# Patient Record
Sex: Female | Born: 1935 | Race: White | Hispanic: No | Smoking: Current every day smoker
Health system: Southern US, Community
[De-identification: ages and names within clinical notes are randomized; demographics above are authoritative.]

## PROBLEM LIST (undated history)

## (undated) DIAGNOSIS — I10 Essential (primary) hypertension: Secondary | ICD-10-CM

## (undated) DIAGNOSIS — J449 Chronic obstructive pulmonary disease, unspecified: Secondary | ICD-10-CM

## (undated) DIAGNOSIS — J45909 Unspecified asthma, uncomplicated: Secondary | ICD-10-CM

---

## 2015-08-28 ENCOUNTER — Emergency Department (HOSPITAL_COMMUNITY): Payer: No Typology Code available for payment source

## 2015-08-28 ENCOUNTER — Encounter (HOSPITAL_COMMUNITY): Payer: Self-pay | Admitting: Emergency Medicine

## 2015-08-28 ENCOUNTER — Emergency Department (HOSPITAL_COMMUNITY)
Admission: EM | Admit: 2015-08-28 | Discharge: 2015-08-29 | Disposition: A | Discharge status: Home / Self Care | Payer: No Typology Code available for payment source | Attending: Emergency Medicine | Admitting: Emergency Medicine

## 2015-08-28 DIAGNOSIS — S2222XA Fracture of body of sternum, initial encounter for closed fracture: Secondary | ICD-10-CM | POA: Diagnosis not present

## 2015-08-28 DIAGNOSIS — I10 Essential (primary) hypertension: Secondary | ICD-10-CM | POA: Insufficient documentation

## 2015-08-28 DIAGNOSIS — Z79899 Other long term (current) drug therapy: Secondary | ICD-10-CM | POA: Diagnosis not present

## 2015-08-28 DIAGNOSIS — Y998 Other external cause status: Secondary | ICD-10-CM | POA: Diagnosis not present

## 2015-08-28 DIAGNOSIS — Y9389 Activity, other specified: Secondary | ICD-10-CM | POA: Diagnosis not present

## 2015-08-28 DIAGNOSIS — Y92410 Unspecified street and highway as the place of occurrence of the external cause: Secondary | ICD-10-CM | POA: Insufficient documentation

## 2015-08-28 DIAGNOSIS — S29001A Unspecified injury of muscle and tendon of front wall of thorax, initial encounter: Secondary | ICD-10-CM | POA: Diagnosis present

## 2015-08-28 DIAGNOSIS — F17210 Nicotine dependence, cigarettes, uncomplicated: Secondary | ICD-10-CM | POA: Diagnosis not present

## 2015-08-28 DIAGNOSIS — J45909 Unspecified asthma, uncomplicated: Secondary | ICD-10-CM | POA: Diagnosis not present

## 2015-08-28 DIAGNOSIS — S2220XA Unspecified fracture of sternum, initial encounter for closed fracture: Secondary | ICD-10-CM

## 2015-08-28 HISTORY — DX: Unspecified asthma, uncomplicated: J45.909

## 2015-08-28 HISTORY — DX: Essential (primary) hypertension: I10

## 2015-08-28 LAB — I-STAT CHEM 8, ED
BUN: 15 mg/dL (ref 6–20)
CALCIUM ION: 1.17 mmol/L (ref 1.13–1.30)
Chloride: 100 mmol/L — ABNORMAL LOW (ref 101–111)
Creatinine, Ser: 1 mg/dL (ref 0.44–1.00)
Glucose, Bld: 132 mg/dL — ABNORMAL HIGH (ref 65–99)
HEMATOCRIT: 43 % (ref 36.0–46.0)
Hemoglobin: 14.6 g/dL (ref 12.0–15.0)
Potassium: 4.3 mmol/L (ref 3.5–5.1)
SODIUM: 140 mmol/L (ref 135–145)
TCO2: 27 mmol/L (ref 0–100)

## 2015-08-28 MED ORDER — IOHEXOL 300 MG/ML  SOLN
75.0000 mL | Freq: Once | INTRAMUSCULAR | Status: AC | PRN
  Administered 2015-08-28: 75 mL via INTRAVENOUS

## 2015-08-28 MED ORDER — OXYCODONE HCL 5 MG PO TABS
2.5000 mg | ORAL_TABLET | Freq: Once | ORAL | Status: AC
  Administered 2015-08-28: 2.5 mg via ORAL
  Filled 2015-08-28: qty 1

## 2015-08-28 MED ORDER — ACETAMINOPHEN 500 MG PO TABS
1000.0000 mg | ORAL_TABLET | Freq: Once | ORAL | Status: AC
  Administered 2015-08-28: 1000 mg via ORAL
  Filled 2015-08-28: qty 2
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
## 2015-08-28 NOTE — ED Notes (Signed)
Patient transported to X-ray 

## 2015-08-28 NOTE — ED Notes (Signed)
This RN attempted IV access x 2 without success; IV team consult in progress
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
## 2015-08-29 MED ORDER — HYDROCODONE-ACETAMINOPHEN 5-325 MG PO TABS: 0.5000 | ORAL_TABLET | ORAL | Status: DC | PRN

## 2015-08-29 MED ORDER — ONDANSETRON HCL 4 MG PO TABS: 4.0000 mg | ORAL_TABLET | Freq: Four times a day (QID) | ORAL | Status: DC
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
## 2017-04-06 ENCOUNTER — Ambulatory Visit (HOSPITAL_COMMUNITY)
Admission: RE | Admit: 2017-04-06 | Discharge: 2017-04-06 | Disposition: A | Discharge status: Home / Self Care | Payer: Medicare Other | Source: Ambulatory Visit | Attending: Family Medicine | Admitting: Family Medicine

## 2017-10-13 ENCOUNTER — Other Ambulatory Visit (HOSPITAL_COMMUNITY): Payer: Self-pay | Admitting: Respiratory Therapy

## 2017-10-13 DIAGNOSIS — J441 Chronic obstructive pulmonary disease with (acute) exacerbation: Secondary | ICD-10-CM

## 2017-10-13 DIAGNOSIS — F17200 Nicotine dependence, unspecified, uncomplicated: Secondary | ICD-10-CM

## 2018-10-28 ENCOUNTER — Encounter (HOSPITAL_COMMUNITY): Payer: Self-pay | Admitting: Emergency Medicine

## 2018-10-28 ENCOUNTER — Emergency Department (HOSPITAL_COMMUNITY)
Admission: EM | Admit: 2018-10-28 | Discharge: 2018-10-29 | Disposition: A | Discharge status: Home / Self Care | Payer: Medicare Other | Attending: Emergency Medicine | Admitting: Emergency Medicine

## 2018-10-28 ENCOUNTER — Emergency Department (HOSPITAL_COMMUNITY): Payer: Medicare Other

## 2018-10-28 DIAGNOSIS — J45909 Unspecified asthma, uncomplicated: Secondary | ICD-10-CM | POA: Diagnosis not present

## 2018-10-28 DIAGNOSIS — R109 Unspecified abdominal pain: Secondary | ICD-10-CM

## 2018-10-28 DIAGNOSIS — R1084 Generalized abdominal pain: Secondary | ICD-10-CM | POA: Diagnosis not present

## 2018-10-28 DIAGNOSIS — Z79899 Other long term (current) drug therapy: Secondary | ICD-10-CM | POA: Diagnosis not present

## 2018-10-28 DIAGNOSIS — K5901 Slow transit constipation: Secondary | ICD-10-CM

## 2018-10-28 DIAGNOSIS — F17210 Nicotine dependence, cigarettes, uncomplicated: Secondary | ICD-10-CM | POA: Diagnosis not present

## 2018-10-28 DIAGNOSIS — M545 Low back pain: Secondary | ICD-10-CM | POA: Diagnosis not present

## 2018-10-28 DIAGNOSIS — I10 Essential (primary) hypertension: Secondary | ICD-10-CM | POA: Insufficient documentation

## 2018-10-28 DIAGNOSIS — M549 Dorsalgia, unspecified: Secondary | ICD-10-CM

## 2018-10-28 LAB — COMPREHENSIVE METABOLIC PANEL
ALK PHOS: 74 U/L (ref 38–126)
ALT: 21 U/L (ref 0–44)
ANION GAP: 15 (ref 5–15)
AST: 31 U/L (ref 15–41)
Albumin: 4.6 g/dL (ref 3.5–5.0)
BILIRUBIN TOTAL: 0.8 mg/dL (ref 0.3–1.2)
BUN: 16 mg/dL (ref 8–23)
CALCIUM: 9.6 mg/dL (ref 8.9–10.3)
CO2: 29 mmol/L (ref 22–32)
CREATININE: 1.05 mg/dL — AB (ref 0.44–1.00)
Chloride: 86 mmol/L — ABNORMAL LOW (ref 98–111)
GFR, EST AFRICAN AMERICAN: 58 mL/min — AB (ref >60)
GFR, EST NON AFRICAN AMERICAN: 50 mL/min — AB (ref >60)
Glucose, Bld: 109 mg/dL — ABNORMAL HIGH (ref 70–99)
Potassium: 3.1 mmol/L — ABNORMAL LOW (ref 3.5–5.1)
Sodium: 130 mmol/L — ABNORMAL LOW (ref 135–145)
TOTAL PROTEIN: 8.7 g/dL — AB (ref 6.5–8.1)

## 2018-10-28 LAB — CBC WITH DIFFERENTIAL/PLATELET
Abs Immature Granulocytes: 0.05 10*3/uL (ref 0.00–0.07)
Basophils Absolute: 0 10*3/uL (ref 0.0–0.1)
Basophils Relative: 0 %
EOS ABS: 0 10*3/uL (ref 0.0–0.5)
EOS PCT: 0 %
HEMATOCRIT: 40.1 % (ref 36.0–46.0)
HEMOGLOBIN: 13 g/dL (ref 12.0–15.0)
Immature Granulocytes: 0 %
Lymphocytes Relative: 10 %
Lymphs Abs: 1.1 10*3/uL (ref 0.7–4.0)
MCH: 29 pg (ref 26.0–34.0)
MCHC: 32.4 g/dL (ref 30.0–36.0)
MCV: 89.3 fL (ref 80.0–100.0)
MONO ABS: 0.7 10*3/uL (ref 0.1–1.0)
Monocytes Relative: 6 %
NRBC: 0 % (ref 0.0–0.2)
Neutro Abs: 9.4 10*3/uL — ABNORMAL HIGH (ref 1.7–7.7)
Neutrophils Relative %: 84 %
Platelets: 292 10*3/uL (ref 150–400)
RBC: 4.49 MIL/uL (ref 3.87–5.11)
RDW: 12.5 % (ref 11.5–15.5)
WBC: 11.3 10*3/uL — AB (ref 4.0–10.5)

## 2018-10-28 LAB — URINALYSIS, ROUTINE W REFLEX MICROSCOPIC
BILIRUBIN URINE: NEGATIVE
Glucose, UA: NEGATIVE mg/dL
KETONES UR: NEGATIVE mg/dL
Nitrite: NEGATIVE
PH: 5 (ref 5.0–8.0)
PROTEIN: NEGATIVE mg/dL
Specific Gravity, Urine: 1.013 (ref 1.005–1.030)

## 2018-10-28 LAB — LIPASE, BLOOD: LIPASE: 35 U/L (ref 11–51)

## 2018-10-28 MED ORDER — POTASSIUM CHLORIDE CRYS ER 20 MEQ PO TBCR
40.0000 meq | EXTENDED_RELEASE_TABLET | Freq: Once | ORAL | Status: AC
  Administered 2018-10-29: 40 meq via ORAL
  Filled 2018-10-28: qty 2

## 2018-10-28 MED ORDER — HYDROMORPHONE HCL 1 MG/ML IJ SOLN
1.0000 mg | Freq: Once | INTRAMUSCULAR | Status: AC
  Administered 2018-10-28: 1 mg via INTRAVENOUS
  Filled 2018-10-28: qty 1

## 2018-10-28 MED ORDER — IOPAMIDOL (ISOVUE-300) INJECTION 61%
100.0000 mL | Freq: Once | INTRAVENOUS | Status: AC | PRN
  Administered 2018-10-28: 100 mL via INTRAVENOUS

## 2018-10-28 MED ORDER — IOPAMIDOL (ISOVUE-300) INJECTION 61%
INTRAVENOUS | Status: AC
  Filled 2018-10-28: qty 100

## 2018-10-28 MED ORDER — SODIUM CHLORIDE (PF) 0.9 % IJ SOLN
INTRAMUSCULAR | Status: AC
  Filled 2018-10-28: qty 50

## 2018-10-28 MED ORDER — LIDOCAINE 5 % EX PTCH
1.0000 | MEDICATED_PATCH | CUTANEOUS | Status: DC
  Administered 2018-10-28: 1 via TRANSDERMAL
  Filled 2018-10-28: qty 1

## 2018-10-28 MED ORDER — ONDANSETRON HCL 4 MG/2ML IJ SOLN
4.0000 mg | Freq: Once | INTRAMUSCULAR | Status: AC
  Administered 2018-10-28: 4 mg via INTRAVENOUS
  Filled 2018-10-28: qty 2

## 2018-10-28 MED ORDER — SODIUM CHLORIDE 0.9 % IV BOLUS
1000.0000 mL | Freq: Once | INTRAVENOUS | Status: AC
  Administered 2018-10-28: 1000 mL via INTRAVENOUS

## 2018-10-28 MED ORDER — CYCLOBENZAPRINE HCL 10 MG PO TABS
5.0000 mg | ORAL_TABLET | Freq: Once | ORAL | Status: DC
  Filled 2018-10-28: qty 1

## 2018-10-28 MED ORDER — DIAZEPAM 2 MG PO TABS
2.0000 mg | ORAL_TABLET | Freq: Once | ORAL | Status: AC
  Administered 2018-10-28: 2 mg via ORAL
  Filled 2018-10-28: qty 1

## 2018-10-28 NOTE — ED Notes (Addendum)
Pt's daughter called, sts pt has called her and told her that she was not able to lay still for abdominal CT. Daughter wants to know what is the reasoning behind focusing on pt's complaint of constipation when pt's biggest issue is back pain and back spasms. Daughter sts pt is not able to walk and she can't take her back home because pt lives alone. Dr Lockie Mola was made aware.

## 2018-10-28 NOTE — ED Notes (Signed)
Patient transported to CT 

## 2018-10-28 NOTE — ED Notes (Signed)
Bed: [facility] Expected date:  Expected time:  Means of arrival:  Comments: EMS-back spasms

## 2018-10-28 NOTE — ED Provider Notes (Signed)
[facility]-EMERGENCY DEPT Provider Note   CSN: [id number] Arrival date & time: 10/28/18  1655     History   Chief Complaint Chief Complaint  Patient presents with  . back spasm  . Constipation    HPI Julia Hernandez is a 83 y.o. female.  The history is provided by the patient.  Back Pain  Location:  Lumbar spine Quality:  Aching Radiates to:  Does not radiate Pain severity:  Moderate Pain is:  Same all the time Onset quality:  Gradual Timing:  Constant Progression:  Unchanged Chronicity:  New Context: not physical stress and not recent illness   Relieved by:  Nothing (started on muscle relaxant by PCP. ) Worsened by:  Movement Associated symptoms: abdominal pain (diffuse, constipation )   Associated symptoms: no bladder incontinence, no bowel incontinence, no chest pain, no dysuria, no fever, no leg pain, no numbness, no paresthesias, no pelvic pain, no perianal numbness, no tingling and no weakness     Past Medical History:  Diagnosis Date  . Asthma   . Hypertension     There are no active problems to display for this patient.   History reviewed. No pertinent surgical history.   OB History   No obstetric history on file.      Home Medications    Prior to Admission medications   Medication Sig Start Date End Date Taking? Authorizing Provider  acetaminophen (TYLENOL) 500 MG tablet Take 500 mg by mouth 2 (two) times daily.   Yes [provider]  acyclovir ointment (ZOVIRAX) 5 % Apply 1 application topically 3 (three) times daily as needed (outbreaks).   Yes [provider]  atorvastatin (LIPITOR) 20 MG tablet Take 20 mg by mouth daily.   Yes [provider]  diazepam (VALIUM) 5 MG tablet Take 5 mg by mouth daily.  08/16/15  Yes [provider]  hydrochlorothiazide (HYDRODIURIL) 25 MG tablet Take 25 mg by mouth daily.   Yes [provider]  Ipratropium-Albuterol (COMBIVENT RESPIMAT) 20-100  MCG/ACT AERS respimat Inhale 1 puff into the lungs 4 (four) times daily as needed for shortness of breath.   Yes [provider]  losartan (COZAAR) 100 MG tablet Take 100 mg by mouth daily.   Yes [provider]  sertraline (ZOLOFT) 100 MG tablet Take 100 mg by mouth at bedtime.   Yes [provider]  tiZANidine (ZANAFLEX) 2 MG tablet Take 2 mg by mouth 3 (three) times daily as needed for muscle spasms.   Yes [provider]  HYDROcodone-acetaminophen (NORCO/VICODIN) 5-325 MG tablet Take 0.5 tablets by mouth every 4 (four) hours as needed for moderate pain. Patient not taking: Reported on 10/28/2018 08/29/15   Gilda Crease, MD  ondansetron (ZOFRAN) 4 MG tablet Take 1 tablet (4 mg total) by mouth every 6 (six) hours. Patient not taking: Reported on 10/28/2018 08/29/15   Gilda Crease, MD    Family History No family history on file.  Social History Social History   Tobacco Use  . Smoking status: Current Every Day Smoker    Packs/day: 1.00    Types: Cigarettes  . Smokeless tobacco: Never Used  Substance Use Topics  . Alcohol use: No  . Drug use: Not on file     Allergies   Patient has no known allergies.   Review of Systems Review of Systems  Constitutional: Negative for chills and fever.  HENT: Negative for ear pain and sore throat.   Eyes: Negative for pain  and visual disturbance.  Respiratory: Negative for cough and shortness of breath.   Cardiovascular: Negative for chest pain and palpitations.  Gastrointestinal: Positive for abdominal pain (diffuse, constipation ) and constipation. Negative for bowel incontinence, diarrhea, nausea and vomiting.  Genitourinary: Negative for bladder incontinence, dysuria, hematuria and pelvic pain.  Musculoskeletal: Positive for back pain. Negative for arthralgias, neck pain and neck stiffness.  Skin: Negative for color change and rash.  Neurological: Negative for tingling, seizures,  syncope, weakness, numbness and paresthesias.  All other systems reviewed and are negative.    Physical Exam Updated Vital Signs  ED Triage Vitals  Enc Vitals Group     BP 10/28/18 1708 (!) 130/91     Pulse Rate 10/28/18 1708 81     Resp 10/28/18 1708 18     Temp 10/28/18 1708 98.3 F (36.8 C)     Temp Source 10/28/18 1708 Oral     SpO2 10/28/18 1708 95 %     Weight 10/28/18 2315 127 lb 13.9 oz (58 kg)     Height 10/28/18 2315 5\' 3"  (1.6 m)     Head Circumference --      Peak Flow --      Pain Score 10/28/18 2315 5     Pain Loc --      Pain Edu? --      Excl. in GC? --     Physical Exam Vitals signs and nursing note reviewed.  Constitutional:      General: She is not in acute distress.    Appearance: She is well-developed.  HENT:     Head: Normocephalic and atraumatic.     Nose: Nose normal.     Mouth/Throat:     Mouth: Mucous membranes are moist.  Eyes:     Extraocular Movements: Extraocular movements intact.     Conjunctiva/sclera: Conjunctivae normal.     Pupils: Pupils are equal, round, and reactive to light.  Neck:     Musculoskeletal: Normal range of motion and neck supple.  Cardiovascular:     Rate and Rhythm: Normal rate and regular rhythm.     Pulses: Normal pulses.     Heart sounds: Normal heart sounds. No murmur.  Pulmonary:     Effort: Pulmonary effort is normal. No respiratory distress.     Breath sounds: Normal breath sounds.  Abdominal:     General: There is distension.     Palpations: Abdomen is soft.     Tenderness: There is abdominal tenderness (diffusely).  Musculoskeletal: Normal range of motion.     Comments: No midline spinal pain, increased tone to b/l paraspinal lumbar muscles   Skin:    General: Skin is warm and dry.     Capillary Refill: Capillary refill takes less than 2 seconds.  Neurological:     General: No focal deficit present.     Mental Status: She is alert.  Psychiatric:        Mood and Affect: Mood normal.      ED  Treatments / Results  Labs (all labs ordered are listed, but only abnormal results are displayed) Labs Reviewed  COMPREHENSIVE METABOLIC PANEL - Abnormal; Notable for the following components:      Result Value   Sodium 130 (*)    Potassium 3.1 (*)    Chloride 86 (*)    Glucose, Bld 109 (*)    Creatinine, Ser 1.05 (*)    Total Protein 8.7 (*)    GFR calc non Af Amer 50 (*)  GFR calc Af Amer 58 (*)    All other components within normal limits  CBC WITH DIFFERENTIAL/PLATELET - Abnormal; Notable for the following components:   WBC 11.3 (*)    Neutro Abs 9.4 (*)    All other components within normal limits  URINALYSIS, ROUTINE W REFLEX MICROSCOPIC - Abnormal; Notable for the following components:   APPearance HAZY (*)    Hgb urine dipstick MODERATE (*)    Leukocytes, UA LARGE (*)    Bacteria, UA RARE (*)    All other components within normal limits  URINE CULTURE  LIPASE, BLOOD    EKG None  Radiology Ct Abdomen Pelvis W Contrast  Result Date: 10/29/2018 CLINICAL DATA:  83 year old female with abdominal and severe back pain. EXAM: CT ABDOMEN AND PELVIS WITH CONTRAST TECHNIQUE: Multidetector CT imaging of the abdomen and pelvis was performed using the standard protocol following bolus administration of intravenous contrast. CONTRAST:  ISOVUE-300 IOPAMIDOL (ISOVUE-300) INJECTION 61% COMPARISON:  Lumbar spine CT dated 10/28/2018 FINDINGS: Lower chest: The visualized lung bases are clear. No intra-abdominal free air or free fluid. Hepatobiliary: Apparent mild diffuse fatty infiltration of the liver. No intrahepatic biliary ductal dilatation. The gallbladder is unremarkable. Pancreas: Unremarkable. No pancreatic ductal dilatation or surrounding inflammatory changes. Spleen: Normal in size without focal abnormality. Adrenals/Urinary Tract: The adrenal glands are unremarkable. There is an 11 mm hypodense lesion in the inferior pole of the right kidney which is incompletely  characterized but demonstrates fluid attenuation most consistent with a cyst. There is no hydronephrosis on either side. There is symmetric enhancement and excretion of contrast by both kidneys. The visualized ureters appear unremarkable. The urinary bladder is collapsed. Stomach/Bowel: There is sigmoid diverticulosis without active inflammatory changes. There is moderate amount of stool throughout the colon. There is no bowel obstruction or active inflammation. The appendix is not visualized with certainty. No inflammatory changes identified in the right lower quadrant. Vascular/Lymphatic: Moderate aortoiliac atherosclerotic disease. No portal venous gas. There is no adenopathy. Reproductive: Hysterectomy. There is a 1.7 cm fluid attenuating/cystic structure from the right ovary, incompletely characterized but likely an indolent process. Further evaluation with ultrasound is recommended. The left ovary is unremarkable as visualized. Other: None Musculoskeletal: There is osteopenia with mild degenerative changes of the spine. There is grade 1 L5-S1 anterolisthesis. There is compression fracture of superior endplate of the L2 vertebra with anterior wedging and approximately 50% loss of vertebral body height anteriorly. This is unchanged since 2016. No acute fracture. IMPRESSION: 1. No acute intra-abdominal or pelvic pathology. 2. Sigmoid diverticulosis. No bowel obstruction or active inflammation. 3. Hysterectomy. A 1.7 cm fluid attenuating/cystic structure from the right ovary, incompletely characterized but likely an indolent process. Further evaluation with ultrasound is recommended. 4. Old L2 compression fracture.  No acute fracture. Electronically Signed   By: Elgie Collard M.D.   On: 10/29/2018 00:01   Ct L-spine No Charge  Result Date: 10/28/2018 CLINICAL DATA:  Severe back pain EXAM: CT LUMBAR SPINE WITHOUT CONTRAST TECHNIQUE: Multidetector CT imaging of the lumbar spine was performed without  intravenous contrast administration. Multiplanar CT image reconstructions were also generated. COMPARISON:  Chest CT 08/28/2015 FINDINGS: Segmentation: 5 lumbar type vertebrae. Alignment: Grade 1 anterolisthesis at L5-S1 due to facet hypertrophy. Vertebrae: There is a wedge compression fracture at L2 involving the superior endplates and anterior wall with approximately 50% height loss anteriorly. No retropulsion or extension to the posterior elements. Paraspinal and other soft tissues: Please see report for dedicated CT of the abdomen  and pelvis. Disc levels: There is no spinal canal stenosis. Foraminal narrowing is moderate at left L3-4 secondary to a left eccentric disc bulge. IMPRESSION: 1. Old wedge compression fracture of L2, unchanged from 08/28/2015. 2. Grade 1 anterolisthesis at L5-S1 due to facet arthrosis. Electronically Signed   By: Deatra Robinson M.D.   On: 10/28/2018 23:59    Procedures Procedures (including critical care time)  Medications Ordered in ED Medications  potassium chloride SA (K-DUR,KLOR-CON) CR tablet 40 mEq (has no administration in time range)  iopamidol (ISOVUE-300) 61 % injection (has no administration in time range)  sodium chloride (PF) 0.9 % injection (has no administration in time range)  lidocaine (LIDODERM) 5 % 1 patch (1 patch Transdermal Patch Applied 10/28/18 2250)  sodium chloride (PF) 0.9 % injection (has no administration in time range)  sodium chloride 0.9 % bolus 1,000 mL (0 mLs Intravenous Stopped 10/29/18 0029)  diazepam (VALIUM) tablet 2 mg (2 mg Oral Given 10/28/18 1911)  iopamidol (ISOVUE-300) 61 % injection 100 mL (100 mLs Intravenous Contrast Given 10/28/18 2322)  HYDROmorphone (DILAUDID) injection 1 mg (1 mg Intravenous Given 10/28/18 2250)  ondansetron (ZOFRAN) injection 4 mg (4 mg Intravenous Given 10/28/18 2358)     Initial Impression / Assessment and Plan / ED Course  I have reviewed the triage vital signs and the nursing notes.  Pertinent  labs & imaging results that were available during my care of the patient were reviewed by me and considered in my medical decision making (see chart for details).     Breely Panik is a patient with history of asthma, hypertension who presents  To the ED with low back pain, constipation.  Patient with normal vitals.  No fever.  Patient with lower back pain for the last several days.  Was given muscle relaxant by primary care doctor without much relief.  Patient is on diazepam which primary care doctor told her to stop taking.  Patient denies any recent trauma.  States that she has had some constipation, diffuse abdominal pain.  She has mild tenderness of her abdomen on exam.  She has no midline spinal tenderness.  Increased tone to her paraspinal lumbar muscles.  Patient with good pulses.  Neurological exam is normal.  Patient is able to stand without much pain.  She does use a cane at home.  Patient with no urinary symptoms.  Patient with likely muscle spasm.  Possible bowel obstruction versus constipation.  Will obtain CT of the lumbar, abdomen and pelvis.  We will get basic labs including urinalysis.  Patient given 2 mg of Valium, lidocaine patch.  Patient with no significant leukocytosis, anemia, electrolyte abnormality except for mild hypokalemia which was repleted.  Patient with equivocal urinalysis.  Will send urine culture.  Patient is not symptomatic.  However, will treat with Keflex.  Patient was CT scan of abdomen and pelvis that showed no acute findings.  She has known cyst of the right ovary.  Recommend that she follow-up with her primary care doctor or OB/GYN about that.  No infectious concern in that area talk to radiology on the phone about this finding.  Patient had unremarkable CT of the lumbar spine.  Patient was unable to initially lie flat for exam and was given IV Dilaudid.  Ever since patient got IV Dilaudid she has had some nausea, lightheadedness.  Has been given IV Zofran.  Family  members at the bedside states that there happy to take care of the patient tonight at home.  Will allow patient to metabolize medication.  She is able to eat or drink without any issues.  Suspect patient mostly with muscle spasm.  Possible UTI.  Will prescribe Keflex, lidocaine patch.  Recommend that she continue Valium instead of muscle relaxant.  Will give prescription for MiraLAX for constipation.  Patient hemodynamically stable throughout my care.  Awaiting metabolizing of Dilaudid and likely to be discharged home. Recommend f/u with PCP.   Patient was able to ambulate without assistance.  Was discharged to home with family.  Given return precautions.  This chart was dictated using voice recognition software.  Despite best efforts to proofread,  errors can occur which can change the documentation meaning.   Final Clinical Impressions(s) / ED Diagnoses   Final diagnoses:  Back pain  Abdominal pain, unspecified abdominal location    ED Discharge Orders    None       Virgina Norfolk, DO 10/29/18 0141

## 2018-10-28 NOTE — ED Triage Notes (Signed)
Pt brought in by [facility] from home for back spasms and constipation that's been ongoing for over 2 weeks.  Took muscle relaxer around 3p today and relieved little bit. Vitals 130/76, CBG 146, 92HR, 100%, 18R.

## 2018-10-29 MED ORDER — LIDOCAINE 5 % EX PTCH: 1.0000 | MEDICATED_PATCH | CUTANEOUS | 0 refills | Status: AC

## 2018-10-29 MED ORDER — POLYETHYLENE GLYCOL 3350 17 G PO PACK: 17.0000 g | PACK | Freq: Every day | ORAL | 0 refills | Status: AC

## 2018-10-29 MED ORDER — CEPHALEXIN 500 MG PO CAPS: 500.0000 mg | ORAL_CAPSULE | Freq: Two times a day (BID) | ORAL | 0 refills | Status: AC

## 2018-10-29 MED ORDER — ONDANSETRON HCL 4 MG/2ML IJ SOLN
4.0000 mg | Freq: Once | INTRAMUSCULAR | Status: AC
  Administered 2018-10-29: 4 mg via INTRAVENOUS
  Filled 2018-10-29: qty 2

## 2018-10-29 NOTE — ED Notes (Signed)
PT DISCHARGED. INSTRUCTIONS AND PRESCRIPTIONS GIVEN. AAOX4. PT IN NO APPARENT DISTRESS WITH MODERATE PAIN. THE OPPORTUNITY TO ASK QUESTIONS WAS PROVIDED. 

## 2018-10-29 NOTE — Discharge Instructions (Addendum)
Discontinue Zanaflex, continue home Valium.  Use lidocaine patch daily.  Use MiraLAX for constipation.  Take Keflex for urinary tract infection.  Follow-up with your primary care doctor.  Return to the ED if her symptoms worsen.

## 2018-10-30 LAB — URINE CULTURE

## 2018-11-10 DIAGNOSIS — J9611 Chronic respiratory failure with hypoxia: Secondary | ICD-10-CM | POA: Insufficient documentation

## 2018-11-10 DIAGNOSIS — K5900 Constipation, unspecified: Secondary | ICD-10-CM | POA: Insufficient documentation

## 2018-11-10 DIAGNOSIS — J441 Chronic obstructive pulmonary disease with (acute) exacerbation: Secondary | ICD-10-CM | POA: Diagnosis present

## 2018-11-10 DIAGNOSIS — J449 Chronic obstructive pulmonary disease, unspecified: Secondary | ICD-10-CM | POA: Diagnosis present

## 2018-11-11 DIAGNOSIS — I340 Nonrheumatic mitral (valve) insufficiency: Secondary | ICD-10-CM | POA: Diagnosis present

## 2018-11-15 DIAGNOSIS — I2510 Atherosclerotic heart disease of native coronary artery without angina pectoris: Secondary | ICD-10-CM | POA: Diagnosis present

## 2018-12-09 ENCOUNTER — Telehealth (HOSPITAL_COMMUNITY): Payer: Self-pay | Admitting: *Deleted

## 2018-12-09 NOTE — Telephone Encounter (Signed)
Received referral for pt to participate in Cardiac Rehab from Dr. Leeann Must @ [facility].  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  Pt discharged on 11/22/18 to SNF.  Pt has follow up appt on 12/14/18.  Will forward to support staff to create referral, verify insurance benefits and eligibility, request MD order and 12 lead ekg tracing.  Other notes are available through Care Everywhere.  Will place in follow up bin for 3/4 appt. For review. Alanson Aly, BSN Cardiac and Emergency planning/management officer

## 2018-12-13 NOTE — Telephone Encounter (Signed)
Pt insurance is active and benefits verified through [employer], DED $110/$110 met, out of pocket $1,300/$514.62 met, co-insurance 15%. no pre-authorization required. Passport, 12/13/2018 @ 4:00pm, REF# [id number]  Will contact patient to see if she is interested in the [place] after order is received.  Tedra Senegal. Support Rep II

## 2018-12-13 NOTE — Telephone Encounter (Signed)
Faxed ppw to Dr. Leeann Must office. Waiting for order to be faxed back. Placed ppw in HHPT/SNF bin. Tempie Donning. Support Rep II

## 2018-12-14 DIAGNOSIS — I429 Cardiomyopathy, unspecified: Secondary | ICD-10-CM | POA: Insufficient documentation

## 2019-01-02 ENCOUNTER — Telehealth (HOSPITAL_COMMUNITY): Payer: Self-pay | Admitting: *Deleted

## 2019-01-02 NOTE — Telephone Encounter (Signed)
Called and left message for call back regarding Cardiac Rehab referral received from Dr. Leeann Must office. Await call back from pt.for advisement of departmental closure and insurance benefits. Contact information provided. Alanson Aly, BSN Cardiac and Emergency planning/management officer

## 2019-01-05 ENCOUNTER — Telehealth (HOSPITAL_COMMUNITY): Payer: Self-pay

## 2019-01-05 NOTE — Telephone Encounter (Signed)
Attempted to contact pt in regards to CR, left voicemail advising pt we had to cancel their orientation appt. And our department will be closed due to the COVID-19. Once we resume scheduling we will contact pt to reschedule

## 2019-01-06 ENCOUNTER — Telehealth (HOSPITAL_COMMUNITY): Payer: Self-pay

## 2019-01-06 NOTE — Telephone Encounter (Signed)
Called and spoke with pt in regards to CR, adv we have recv'd the pt referral. And at this time we are not scheduling due to the COVID-19. Once we have resume scheduling we will contact the pt. She/He verbalized understanding. °Gloria W. Support Rep II °

## 2019-02-07 ENCOUNTER — Telehealth (HOSPITAL_COMMUNITY): Payer: Self-pay | Admitting: *Deleted

## 2019-04-11 ENCOUNTER — Telehealth (HOSPITAL_COMMUNITY): Payer: Self-pay

## 2019-04-11 NOTE — Telephone Encounter (Signed)
No response from pt, closed referral. °

## 2020-07-16 ENCOUNTER — Ambulatory Visit: Payer: Medicare Other | Attending: Internal Medicine

## 2020-07-16 DIAGNOSIS — Z23 Encounter for immunization: Secondary | ICD-10-CM

## 2020-07-16 NOTE — Progress Notes (Signed)
   Covid-19 Vaccination Clinic  Name:  Tamanika Heiney    MRN: [id number] DOB: 07/12/1936  07/16/2020  Ms. Mims was observed post Covid-19 immunization for 15 minutes without incident. She was provided with Vaccine Information Sheet and instruction to access the V-Safe system.   Ms. Knee was instructed to call 911 with any severe reactions post vaccine: [name] Difficulty breathing  . Swelling of face and throat  . A fast heartbeat  . A bad rash all over body  . Dizziness and weakness

## 2021-08-20 DIAGNOSIS — N1831 Chronic kidney disease, stage 3a: Secondary | ICD-10-CM | POA: Diagnosis present

## 2021-08-20 DIAGNOSIS — Z955 Presence of coronary angioplasty implant and graft: Secondary | ICD-10-CM

## 2021-08-20 DIAGNOSIS — M810 Age-related osteoporosis without current pathological fracture: Secondary | ICD-10-CM | POA: Insufficient documentation

## 2021-08-20 DIAGNOSIS — S32000A Wedge compression fracture of unspecified lumbar vertebra, initial encounter for closed fracture: Secondary | ICD-10-CM | POA: Diagnosis present

## 2021-08-20 DIAGNOSIS — F5101 Primary insomnia: Secondary | ICD-10-CM | POA: Insufficient documentation

## 2021-08-20 DIAGNOSIS — F329 Major depressive disorder, single episode, unspecified: Secondary | ICD-10-CM | POA: Insufficient documentation

## 2021-08-20 DIAGNOSIS — E538 Deficiency of other specified B group vitamins: Secondary | ICD-10-CM | POA: Insufficient documentation

## 2021-08-20 DIAGNOSIS — E785 Hyperlipidemia, unspecified: Secondary | ICD-10-CM | POA: Insufficient documentation

## 2022-02-24 ENCOUNTER — Other Ambulatory Visit: Payer: Self-pay | Admitting: Family Medicine

## 2022-02-24 ENCOUNTER — Ambulatory Visit
Admission: RE | Admit: 2022-02-24 | Discharge: 2022-02-24 | Disposition: A | Discharge status: Home / Self Care | Payer: Medicare HMO | Source: Ambulatory Visit | Attending: Family Medicine | Admitting: Family Medicine

## 2022-02-24 DIAGNOSIS — R1084 Generalized abdominal pain: Secondary | ICD-10-CM

## 2022-02-24 MED ORDER — IOPAMIDOL (ISOVUE-300) INJECTION 61%: 100.0000 mL | Freq: Once | INTRAVENOUS | Status: DC | PRN

## 2022-03-07 ENCOUNTER — Emergency Department (HOSPITAL_COMMUNITY)
Admission: EM | Admit: 2022-03-07 | Discharge: 2022-03-08 | Disposition: A | Discharge status: Home / Self Care | Payer: Medicare HMO | Attending: Emergency Medicine | Admitting: Emergency Medicine

## 2022-03-07 ENCOUNTER — Emergency Department (HOSPITAL_COMMUNITY): Payer: Medicare HMO

## 2022-03-07 DIAGNOSIS — R600 Localized edema: Secondary | ICD-10-CM | POA: Diagnosis not present

## 2022-03-07 DIAGNOSIS — R110 Nausea: Secondary | ICD-10-CM | POA: Insufficient documentation

## 2022-03-07 DIAGNOSIS — E876 Hypokalemia: Secondary | ICD-10-CM | POA: Insufficient documentation

## 2022-03-07 DIAGNOSIS — R0602 Shortness of breath: Secondary | ICD-10-CM | POA: Diagnosis not present

## 2022-03-07 DIAGNOSIS — M7989 Other specified soft tissue disorders: Secondary | ICD-10-CM | POA: Diagnosis not present

## 2022-03-07 DIAGNOSIS — M545 Low back pain, unspecified: Secondary | ICD-10-CM | POA: Diagnosis not present

## 2022-03-07 DIAGNOSIS — R109 Unspecified abdominal pain: Secondary | ICD-10-CM | POA: Diagnosis not present

## 2022-03-07 DIAGNOSIS — R0789 Other chest pain: Secondary | ICD-10-CM | POA: Insufficient documentation

## 2022-03-07 DIAGNOSIS — R079 Chest pain, unspecified: Secondary | ICD-10-CM

## 2022-03-07 LAB — CBC WITH DIFFERENTIAL/PLATELET
Abs Immature Granulocytes: 0.12 10*3/uL — ABNORMAL HIGH (ref 0.00–0.07)
Basophils Absolute: 0 10*3/uL (ref 0.0–0.1)
Basophils Relative: 0 %
Eosinophils Absolute: 0 10*3/uL (ref 0.0–0.5)
Eosinophils Relative: 0 %
HCT: 25.5 % — ABNORMAL LOW (ref 36.0–46.0)
Hemoglobin: 7.6 g/dL — ABNORMAL LOW (ref 12.0–15.0)
Immature Granulocytes: 1 %
Lymphocytes Relative: 4 %
Lymphs Abs: 0.7 10*3/uL (ref 0.7–4.0)
MCH: 24.8 pg — ABNORMAL LOW (ref 26.0–34.0)
MCHC: 29.8 g/dL — ABNORMAL LOW (ref 30.0–36.0)
MCV: 83.1 fL (ref 80.0–100.0)
Monocytes Absolute: 0.9 10*3/uL (ref 0.1–1.0)
Monocytes Relative: 5 %
Neutro Abs: 15.4 10*3/uL — ABNORMAL HIGH (ref 1.7–7.7)
Neutrophils Relative %: 90 %
Platelets: 387 10*3/uL (ref 150–400)
RBC: 3.07 MIL/uL — ABNORMAL LOW (ref 3.87–5.11)
RDW: 14.6 % (ref 11.5–15.5)
WBC: 17.1 10*3/uL — ABNORMAL HIGH (ref 4.0–10.5)
nRBC: 0 % (ref 0.0–0.2)

## 2022-03-07 LAB — COMPREHENSIVE METABOLIC PANEL
ALT: 11 U/L (ref 0–44)
AST: 23 U/L (ref 15–41)
Albumin: 3 g/dL — ABNORMAL LOW (ref 3.5–5.0)
Alkaline Phosphatase: 80 U/L (ref 38–126)
Anion gap: 12 (ref 5–15)
BUN: 18 mg/dL (ref 8–23)
CO2: 32 mmol/L (ref 22–32)
Calcium: 8.9 mg/dL (ref 8.9–10.3)
Chloride: 94 mmol/L — ABNORMAL LOW (ref 98–111)
Creatinine, Ser: 1.03 mg/dL — ABNORMAL HIGH (ref 0.44–1.00)
GFR, Estimated: 54 mL/min — ABNORMAL LOW (ref >60)
Glucose, Bld: 125 mg/dL — ABNORMAL HIGH (ref 70–99)
Potassium: 2.7 mmol/L — CL (ref 3.5–5.1)
Sodium: 138 mmol/L (ref 135–145)
Total Bilirubin: 0.5 mg/dL (ref 0.3–1.2)
Total Protein: 6.7 g/dL (ref 6.5–8.1)

## 2022-03-07 LAB — TROPONIN I (HIGH SENSITIVITY)
Troponin I (High Sensitivity): 22 ng/L — ABNORMAL HIGH (ref <18)
Troponin I (High Sensitivity): 23 ng/L — ABNORMAL HIGH (ref <18)

## 2022-03-07 LAB — LIPASE, BLOOD: Lipase: 30 U/L (ref 11–51)

## 2022-03-07 MED ORDER — ONDANSETRON HCL 4 MG/2ML IJ SOLN
4.0000 mg | Freq: Once | INTRAMUSCULAR | Status: DC
  Filled 2022-03-07: qty 2

## 2022-03-07 MED ORDER — MORPHINE SULFATE (PF) 4 MG/ML IV SOLN
4.0000 mg | Freq: Once | INTRAVENOUS | Status: DC
  Filled 2022-03-07: qty 1

## 2022-03-07 MED ORDER — SODIUM CHLORIDE 0.9 % IV BOLUS
500.0000 mL | Freq: Once | INTRAVENOUS | Status: AC
  Administered 2022-03-07: 500 mL via INTRAVENOUS

## 2022-03-07 MED ORDER — POTASSIUM CHLORIDE CRYS ER 20 MEQ PO TBCR
40.0000 meq | EXTENDED_RELEASE_TABLET | Freq: Once | ORAL | Status: AC
  Administered 2022-03-07: 40 meq via ORAL
  Filled 2022-03-07: qty 2

## 2022-03-07 MED ORDER — DIAZEPAM 5 MG PO TABS: 5.0000 mg | ORAL_TABLET | Freq: Every day | ORAL | 0 refills | Status: DC

## 2022-03-07 MED ORDER — IOHEXOL 350 MG/ML SOLN
100.0000 mL | Freq: Once | INTRAVENOUS | Status: AC | PRN
  Administered 2022-03-07: 100 mL via INTRAVENOUS

## 2022-03-07 MED ORDER — DIAZEPAM 5 MG PO TABS
5.0000 mg | ORAL_TABLET | Freq: Once | ORAL | Status: AC
  Administered 2022-03-07: 5 mg via ORAL
  Filled 2022-03-07: qty 1

## 2022-03-07 NOTE — ED Provider Notes (Signed)
[facility] EMERGENCY DEPARTMENT Provider Note   CSN: [id number] Arrival date & time: 03/07/22  1948     History {Add pertinent medical, surgical, social history, OB history to HPI:1} No chief complaint on file.   Julia Hernandez is a 86 y.o. female.  She has a history of back pain atypical chest pain shortness of breath.  Had cardiac stents in the past.  Was recently at [facility] and found to have an L2 compression fracture, taken off her Valium and put on oxycodone for pain.  She said she has daily spasms in her back that have been going on for 4 years, pain radiates around into her chest and causes her to be short of breath and anxious.  Similar event again tonight.  She was afraid to take her pain medication that they put her on.  No cough.  Does feel dehydrated, lips are chapped.  She said they did a CAT scan at [facility] and told her she had gastritis and put her on a PPI.  They also did an ultrasound of her right lower leg its been swollen for a few weeks and did not find any evidence of DVT.  The history is provided by the patient and a relative.  Chest Pain Pain location:  L chest and R chest Pain quality: stabbing   Pain radiates to:  Lower back and mid back Pain severity:  Severe Onset quality:  Gradual Duration: 4 years. Timing:  Intermittent Progression:  Unchanged Chronicity:  Chronic Relieved by:  None tried Worsened by:  Nothing Ineffective treatments:  None tried Associated symptoms: abdominal pain, back pain, nausea and shortness of breath   Associated symptoms: no cough, no diaphoresis, no fever, no headache and no vomiting       Home Medications Prior to Admission medications   Medication Sig Start Date End Date Taking? Authorizing Provider  acetaminophen (TYLENOL) 500 MG tablet Take 500 mg by mouth 2 (two) times daily.    [provider]  acyclovir ointment (ZOVIRAX) 5 % Apply 1 application topically 3 (three) times daily as  needed (outbreaks).    [provider]  atorvastatin (LIPITOR) 20 MG tablet Take 20 mg by mouth daily.    [provider]  diazepam (VALIUM) 5 MG tablet Take 5 mg by mouth daily.  08/16/15   [provider]  hydrochlorothiazide (HYDRODIURIL) 25 MG tablet Take 25 mg by mouth daily.    [provider]  HYDROcodone-acetaminophen (NORCO/VICODIN) 5-325 MG tablet Take 0.5 tablets by mouth every 4 (four) hours as needed for moderate pain. Patient not taking: Reported on 10/28/2018 08/29/15   Gilda Crease, MD  Ipratropium-Albuterol (COMBIVENT RESPIMAT) 20-100 MCG/ACT AERS respimat Inhale 1 puff into the lungs 4 (four) times daily as needed for shortness of breath.    [provider]  losartan (COZAAR) 100 MG tablet Take 100 mg by mouth daily.    [provider]  ondansetron (ZOFRAN) 4 MG tablet Take 1 tablet (4 mg total) by mouth every 6 (six) hours. Patient not taking: Reported on 10/28/2018 08/29/15   Gilda Crease, MD  sertraline (ZOLOFT) 100 MG tablet Take 100 mg by mouth at bedtime.    [provider]      Allergies    Patient has no known allergies.    Review of Systems   Review of Systems  Constitutional:  Negative for diaphoresis and fever.  HENT:  Positive for mouth sores. Negative for sore throat.   Eyes:  Negative for visual disturbance.  Respiratory:  Positive for shortness of breath. Negative for cough.   Cardiovascular:  Positive for chest pain and leg swelling (right).  Gastrointestinal:  Positive for abdominal pain and nausea. Negative for vomiting.  Genitourinary:  Negative for dysuria.  Musculoskeletal:  Positive for back pain.  Skin:  Negative for rash.  Neurological:  Negative for headaches.   Physical Exam Updated Vital Signs BP (!) 148/68   Pulse 100   Temp 98.1 F (36.7 C) (Oral)   Resp 18   SpO2 100%  Physical Exam Vitals and nursing note reviewed.  Constitutional:      General: She  is not in acute distress.    Appearance: Normal appearance. She is well-developed.  HENT:     Head: Normocephalic and atraumatic.  Eyes:     Conjunctiva/sclera: Conjunctivae normal.  Cardiovascular:     Rate and Rhythm: Normal rate and regular rhythm.     Heart sounds: Normal heart sounds. No murmur heard. Pulmonary:     Effort: Pulmonary effort is normal. No respiratory distress.     Breath sounds: Normal breath sounds.  Abdominal:     Palpations: Abdomen is soft.     Tenderness: There is no abdominal tenderness. There is no guarding or rebound.  Musculoskeletal:        General: No swelling. Normal range of motion.     Cervical back: Neck supple.     Right lower leg: Edema (foot only) present.  Skin:    General: Skin is warm and dry.     Capillary Refill: Capillary refill takes less than 2 seconds.  Neurological:     General: No focal deficit present.     Mental Status: She is alert.    ED Results / Procedures / Treatments   Labs (all labs ordered are listed, but only abnormal results are displayed) Labs Reviewed  COMPREHENSIVE METABOLIC PANEL  LIPASE, BLOOD  CBC WITH DIFFERENTIAL/PLATELET  TROPONIN I (HIGH SENSITIVITY)    EKG None  Radiology No results found.  Procedures Procedures  {Document cardiac monitor, telemetry assessment procedure when appropriate:1}  Medications Ordered in ED Medications  sodium chloride 0.9 % bolus 500 mL (has no administration in time range)  diazepam (VALIUM) tablet 5 mg (has no administration in time range)    ED Course/ Medical Decision Making/ A&P                           Medical Decision Making Amount and/or Complexity of Data Reviewed Labs: ordered. Radiology: ordered.  Risk Prescription drug management.  This patient complains of ***; this involves an extensive number of treatment Options and is a complaint that carries with it a high risk of complications and morbidity. The differential includes ***  I  ordered, reviewed and interpreted labs, which included *** I ordered medication *** and reviewed PMP when indicated. I ordered imaging studies which included *** and I independently    visualized and interpreted imaging which showed *** Additional history obtained from *** Previous records obtained and reviewed *** I consulted *** and discussed lab and imaging findings and discussed disposition.  Cardiac monitoring reviewed, *** Social determinants considered, *** Critical Interventions: ***  After the interventions stated above, I reevaluated the patient and found *** Admission and further testing considered, ***    {Document critical care time when appropriate:1} {Document review of labs and clinical decision tools ie heart score, Chads2Vasc2 etc:1}  {Document your independent review  of radiology images, and any outside records:1} {Document your discussion with family members, caretakers, and with consultants:1} {Document social determinants of health affecting pt's care:1} {Document your decision making why or why not admission, treatments were needed:1} Final Clinical Impression(s) / ED Diagnoses Final diagnoses:  None    Rx / DC Orders ED Discharge Orders     None

## 2022-03-07 NOTE — Discharge Instructions (Signed)
You are seen in the emergency room for episodes of chest and abdominal pain back pain and continued right leg swelling.  You had blood work EKG and a CAT scan of your chest abdomen and pelvis that did not show an obvious explanation for your symptoms.  There is no evidence of heart attack or blood clot in your lungs.  We are restarting your Valium.  Please do not mix this with your pain medication.  Please contact your primary care doctor for close follow-up this week.  We are also giving contact information for the spine doctors.  Return to the emergency department if any worsening or concerning symptoms.

## 2022-03-07 NOTE — ED Triage Notes (Addendum)
PT BIB [facility] from home for SOB.  PT has hx of COPD/CHF.  Per EMS she has an inhaler prescribed to be used 2-3 x a day and has not been able to use it today.  EMS states pt also endorses intercostal pain that is possilby associated with an L2 fx dx at [facility] on 5/9 per paperwork at bedside. EMs also noted that she had been taking valium for anxiety but it was stopped when Oxy was started.    EMs states pt was diminished on arrival.  Gave 5 of albuterol neb and placed on 3L for comfort.  EMS VS 164/64 99% 3L, HR 98,

## 2022-03-08 LAB — URINALYSIS, ROUTINE W REFLEX MICROSCOPIC
Bilirubin Urine: NEGATIVE
Glucose, UA: NEGATIVE mg/dL
Hgb urine dipstick: NEGATIVE
Ketones, ur: NEGATIVE mg/dL
Leukocytes,Ua: NEGATIVE
Nitrite: NEGATIVE
Protein, ur: NEGATIVE mg/dL
Specific Gravity, Urine: 1.036 — ABNORMAL HIGH (ref 1.005–1.030)
pH: 6 (ref 5.0–8.0)

## 2022-03-19 ENCOUNTER — Inpatient Hospital Stay (HOSPITAL_BASED_OUTPATIENT_CLINIC_OR_DEPARTMENT_OTHER)
Admission: EM | Admit: 2022-03-19 | Discharge: 2022-03-30 | DRG: 812 | Disposition: A | Discharge status: Skilled Nursing Facility | Payer: Medicare HMO | Attending: Internal Medicine | Admitting: Internal Medicine

## 2022-03-19 ENCOUNTER — Encounter (HOSPITAL_COMMUNITY): Payer: Self-pay | Admitting: Internal Medicine

## 2022-03-19 ENCOUNTER — Other Ambulatory Visit: Payer: Self-pay

## 2022-03-19 DIAGNOSIS — G8929 Other chronic pain: Secondary | ICD-10-CM | POA: Diagnosis not present

## 2022-03-19 DIAGNOSIS — F40240 Claustrophobia: Secondary | ICD-10-CM | POA: Diagnosis present

## 2022-03-19 DIAGNOSIS — X58XXXA Exposure to other specified factors, initial encounter: Secondary | ICD-10-CM | POA: Diagnosis present

## 2022-03-19 DIAGNOSIS — J449 Chronic obstructive pulmonary disease, unspecified: Secondary | ICD-10-CM | POA: Diagnosis present

## 2022-03-19 DIAGNOSIS — Z66 Do not resuscitate: Secondary | ICD-10-CM

## 2022-03-19 DIAGNOSIS — K5900 Constipation, unspecified: Secondary | ICD-10-CM | POA: Diagnosis present

## 2022-03-19 DIAGNOSIS — Z720 Tobacco use: Secondary | ICD-10-CM

## 2022-03-19 DIAGNOSIS — D509 Iron deficiency anemia, unspecified: Principal | ICD-10-CM | POA: Diagnosis present

## 2022-03-19 DIAGNOSIS — E440 Moderate protein-calorie malnutrition: Secondary | ICD-10-CM | POA: Insufficient documentation

## 2022-03-19 DIAGNOSIS — Z7901 Long term (current) use of anticoagulants: Secondary | ICD-10-CM

## 2022-03-19 DIAGNOSIS — I2510 Atherosclerotic heart disease of native coronary artery without angina pectoris: Secondary | ICD-10-CM | POA: Diagnosis not present

## 2022-03-19 DIAGNOSIS — M549 Dorsalgia, unspecified: Secondary | ICD-10-CM | POA: Diagnosis not present

## 2022-03-19 DIAGNOSIS — S32000A Wedge compression fracture of unspecified lumbar vertebra, initial encounter for closed fracture: Secondary | ICD-10-CM | POA: Diagnosis present

## 2022-03-19 DIAGNOSIS — F39 Unspecified mood [affective] disorder: Secondary | ICD-10-CM | POA: Diagnosis present

## 2022-03-19 DIAGNOSIS — Z955 Presence of coronary angioplasty implant and graft: Secondary | ICD-10-CM

## 2022-03-19 DIAGNOSIS — Z7902 Long term (current) use of antithrombotics/antiplatelets: Secondary | ICD-10-CM | POA: Diagnosis not present

## 2022-03-19 DIAGNOSIS — N1831 Chronic kidney disease, stage 3a: Secondary | ICD-10-CM | POA: Diagnosis not present

## 2022-03-19 DIAGNOSIS — K921 Melena: Secondary | ICD-10-CM | POA: Diagnosis present

## 2022-03-19 DIAGNOSIS — K222 Esophageal obstruction: Secondary | ICD-10-CM | POA: Diagnosis present

## 2022-03-19 DIAGNOSIS — Z6821 Body mass index (BMI) 21.0-21.9, adult: Secondary | ICD-10-CM

## 2022-03-19 DIAGNOSIS — D649 Anemia, unspecified: Secondary | ICD-10-CM | POA: Diagnosis present

## 2022-03-19 DIAGNOSIS — I129 Hypertensive chronic kidney disease with stage 1 through stage 4 chronic kidney disease, or unspecified chronic kidney disease: Secondary | ICD-10-CM | POA: Diagnosis present

## 2022-03-19 DIAGNOSIS — I340 Nonrheumatic mitral (valve) insufficiency: Secondary | ICD-10-CM | POA: Diagnosis present

## 2022-03-19 DIAGNOSIS — M8088XA Other osteoporosis with current pathological fracture, vertebra(e), initial encounter for fracture: Secondary | ICD-10-CM | POA: Diagnosis present

## 2022-03-19 DIAGNOSIS — T18128A Food in esophagus causing other injury, initial encounter: Secondary | ICD-10-CM | POA: Diagnosis present

## 2022-03-19 DIAGNOSIS — N179 Acute kidney failure, unspecified: Secondary | ICD-10-CM

## 2022-03-19 DIAGNOSIS — F17210 Nicotine dependence, cigarettes, uncomplicated: Secondary | ICD-10-CM | POA: Diagnosis present

## 2022-03-19 DIAGNOSIS — Z79899 Other long term (current) drug therapy: Secondary | ICD-10-CM

## 2022-03-19 DIAGNOSIS — I083 Combined rheumatic disorders of mitral, aortic and tricuspid valves: Secondary | ICD-10-CM | POA: Diagnosis not present

## 2022-03-19 DIAGNOSIS — I252 Old myocardial infarction: Secondary | ICD-10-CM

## 2022-03-19 DIAGNOSIS — R110 Nausea: Secondary | ICD-10-CM | POA: Diagnosis present

## 2022-03-19 DIAGNOSIS — K224 Dyskinesia of esophagus: Secondary | ICD-10-CM | POA: Diagnosis present

## 2022-03-19 DIAGNOSIS — J441 Chronic obstructive pulmonary disease with (acute) exacerbation: Secondary | ICD-10-CM | POA: Diagnosis present

## 2022-03-19 DIAGNOSIS — Z7951 Long term (current) use of inhaled steroids: Secondary | ICD-10-CM | POA: Diagnosis not present

## 2022-03-19 DIAGNOSIS — E876 Hypokalemia: Secondary | ICD-10-CM | POA: Diagnosis not present

## 2022-03-19 DIAGNOSIS — F419 Anxiety disorder, unspecified: Secondary | ICD-10-CM | POA: Diagnosis present

## 2022-03-19 LAB — CBC WITH DIFFERENTIAL/PLATELET
Abs Immature Granulocytes: 0.04 10*3/uL (ref 0.00–0.07)
Basophils Absolute: 0 10*3/uL (ref 0.0–0.1)
Basophils Relative: 0 %
Eosinophils Absolute: 0 10*3/uL (ref 0.0–0.5)
Eosinophils Relative: 0 %
HCT: 26.5 % — ABNORMAL LOW (ref 36.0–46.0)
Hemoglobin: 7.7 g/dL — ABNORMAL LOW (ref 12.0–15.0)
Immature Granulocytes: 1 %
Lymphocytes Relative: 14 %
Lymphs Abs: 1.1 10*3/uL (ref 0.7–4.0)
MCH: 23 pg — ABNORMAL LOW (ref 26.0–34.0)
MCHC: 29.1 g/dL — ABNORMAL LOW (ref 30.0–36.0)
MCV: 79.1 fL — ABNORMAL LOW (ref 80.0–100.0)
Monocytes Absolute: 0.5 10*3/uL (ref 0.1–1.0)
Monocytes Relative: 6 %
Neutro Abs: 6.2 10*3/uL (ref 1.7–7.7)
Neutrophils Relative %: 79 %
Platelets: 214 10*3/uL (ref 150–400)
RBC: 3.35 MIL/uL — ABNORMAL LOW (ref 3.87–5.11)
RDW: 15 % (ref 11.5–15.5)
WBC: 7.8 10*3/uL (ref 4.0–10.5)
nRBC: 0 % (ref 0.0–0.2)

## 2022-03-19 LAB — URINALYSIS, ROUTINE W REFLEX MICROSCOPIC
Bilirubin Urine: NEGATIVE
Glucose, UA: NEGATIVE mg/dL
Hgb urine dipstick: NEGATIVE
Ketones, ur: NEGATIVE mg/dL
Leukocytes,Ua: NEGATIVE
Nitrite: NEGATIVE
Specific Gravity, Urine: 1.025 (ref 1.005–1.030)
pH: 5.5 (ref 5.0–8.0)

## 2022-03-19 LAB — COMPREHENSIVE METABOLIC PANEL
ALT: 11 U/L (ref 0–44)
AST: 18 U/L (ref 15–41)
Albumin: 3.8 g/dL (ref 3.5–5.0)
Alkaline Phosphatase: 83 U/L (ref 38–126)
Anion gap: 11 (ref 5–15)
BUN: 35 mg/dL — ABNORMAL HIGH (ref 8–23)
CO2: 38 mmol/L — ABNORMAL HIGH (ref 22–32)
Calcium: 9.8 mg/dL (ref 8.9–10.3)
Chloride: 93 mmol/L — ABNORMAL LOW (ref 98–111)
Creatinine, Ser: 1.36 mg/dL — ABNORMAL HIGH (ref 0.44–1.00)
GFR, Estimated: 39 mL/min — ABNORMAL LOW (ref >60)
Glucose, Bld: 106 mg/dL — ABNORMAL HIGH (ref 70–99)
Potassium: 3.2 mmol/L — ABNORMAL LOW (ref 3.5–5.1)
Sodium: 142 mmol/L (ref 135–145)
Total Bilirubin: 0.5 mg/dL (ref 0.3–1.2)
Total Protein: 7.3 g/dL (ref 6.5–8.1)

## 2022-03-19 LAB — TROPONIN I (HIGH SENSITIVITY): Troponin I (High Sensitivity): 26 ng/L — ABNORMAL HIGH (ref <18)

## 2022-03-19 MED ORDER — SODIUM CHLORIDE 0.9 % IV BOLUS
500.0000 mL | Freq: Once | INTRAVENOUS | Status: AC
  Administered 2022-03-19: 500 mL via INTRAVENOUS

## 2022-03-19 NOTE — Subjective & Objective (Signed)
CC: chest pain, back pain, weakness HPI: 86 year old white female history of COPD, osteoporosis, coronary disease, CKD stage IIIa, history of mitral valve regurgitation, presents to the ER today with complaints of weakness, chest pain rating to the back.  This makes her fourth ER visit in the last 4 weeks.  She was seen twice at the [facility] ER on May 9 and May 16.  She was seen at the [facility], ER on May 27 and then again at the [place] ER today.  Patient had a CTPA done on May the 16,023 at the [facility] which demonstrated no PE.  She had T9 and T8 compression fractures.  There is also an L2 compression fracture.  Lower extremity ultrasounds were negative for DVT.  When she was seen in the [facility] ER on 03/07/2022, she also had another CTPA which was negative for PE.  This again demonstrated T8-T9 compression fractures and compression fractures from T11-L1.  Today she complained of chronic back pain.  Apparently patient was seen by physical therapy today and was noted to have systolic blood pressure in the 80s.  She was sent to the ER.  On arrival to the ER, temp 98.4 heart rate 72 blood pressure 125/57 satting 91% on room air.  Labs did show a white count of 7.8, hemoglobin 7.7, platelets of 213, MCV of 79  Chemistry sodium 142, potassium 3.2, bicarb 38, BUN of 35, creatinine 1.37  Patient states that she is continue to take her HCTZ due to lower extremity edema.  She is also continue to take Cozaar.  She also takes Brilinta 60 mg twice a day for history of coronary disease.  Due to the patient's continued anemia, she was transferred to [facility] for further care.  Patient does recall about 2 weeks ago she did have some constipation.  When she was finally able to have a bowel movement, she had a bloody stool.  This was only one episode.  She has not had any bloody stools since then.  Patient states that she does not see a GI  physician on a regular basis.  She has not had a colonoscopy in at least 5 years.

## 2022-03-19 NOTE — Assessment & Plan Note (Signed)
Chronic. Will update her echo.

## 2022-03-19 NOTE — ED Notes (Signed)
Orthostatic vitals performed, charted and PA made aware of results.

## 2022-03-19 NOTE — Assessment & Plan Note (Signed)
Pt c/o of continue pain her chest that radiates to her back. Recent CTPA at the end of may 2023 shows multiple vertebral compressions fracture. No PE. Pt states oxycodone that was given to her from [facility] ER did help but she is scared to take opiates.

## 2022-03-19 NOTE — ED Notes (Signed)
Provider aware of IV infiltration, IV d/c and new IV established.

## 2022-03-19 NOTE — Assessment & Plan Note (Signed)
Stable

## 2022-03-19 NOTE — Assessment & Plan Note (Addendum)
Chronic. Not exacerbated. Continue with symbicort inhalers.

## 2022-03-19 NOTE — ED Notes (Signed)
Called [facility] to transport patient to [facility] rm# [phone]

## 2022-03-19 NOTE — ED Triage Notes (Signed)
Patient presents to ED via [facility] from home. Patient here with chronic back pain. Known compression fractures.

## 2022-03-19 NOTE — Assessment & Plan Note (Signed)
Continues to smoke 3 cigarettes per day.

## 2022-03-19 NOTE — ED Provider Notes (Cosign Needed Addendum)
[facility] EMERGENCY DEPT Provider Note   CSN: [id number] Arrival date & time: 03/19/22  1519     History  Chief Complaint  Patient presents with   Back Pain    Julia Hernandez is a 86 y.o. female.  Patient with history of asthma, hypertension, NSTEMI, COPD, and chronic back pain with known compression fractures presents today with complaints of hypotension and lightheadedness. She states that over the past several weeks she has had thrush in her mouth and has been having significant difficulty eating and drinking. She states that she went to her regular physical therapy appointment this morning and was feeling very weak and lightheaded. She states they checked her blood pressure at physical therapy and saw that it was 80 systolic. They called EMS and she was brought here for further evaluation of same. Patient states that she continues to have chest and back pain as well as right leg swelling that has been persistent and not worse since her previous ER visits. She expresses significant frustration that she has not had answers about the etiology of her symptoms.   Of note, this is patients 4th ER visit in the past month with complaints previously of chest and back pain as well as right leg swelling. She has had extensive laboratory evaluation and imaging of same which have showed some anemia and hypokalemia but no other acute findings.   The history is provided by the patient and a relative. No language interpreter was used.  Back Pain Associated symptoms: weakness        Home Medications Prior to Admission medications   Medication Sig Start Date End Date Taking? Authorizing Provider  acetaminophen (TYLENOL) 500 MG tablet Take 500 mg by mouth 2 (two) times daily.    [provider]  acyclovir ointment (ZOVIRAX) 5 % Apply 1 application topically 3 (three) times daily as needed (outbreaks).    [provider]  atorvastatin (LIPITOR) 20 MG tablet Take 20 mg  by mouth daily.    [provider]  diazepam (VALIUM) 5 MG tablet Take 1 tablet (5 mg total) by mouth at bedtime. 03/07/22   Terrilee Files, MD  hydrochlorothiazide (HYDRODIURIL) 25 MG tablet Take 25 mg by mouth daily.    [provider]  HYDROcodone-acetaminophen (NORCO/VICODIN) 5-325 MG tablet Take 0.5 tablets by mouth every 4 (four) hours as needed for moderate pain. Patient not taking: Reported on 10/28/2018 08/29/15   Gilda Crease, MD  Ipratropium-Albuterol (COMBIVENT RESPIMAT) 20-100 MCG/ACT AERS respimat Inhale 1 puff into the lungs 4 (four) times daily as needed for shortness of breath.    [provider]  losartan (COZAAR) 100 MG tablet Take 100 mg by mouth daily.    [provider]  ondansetron (ZOFRAN) 4 MG tablet Take 1 tablet (4 mg total) by mouth every 6 (six) hours. Patient not taking: Reported on 10/28/2018 08/29/15   Gilda Crease, MD  sertraline (ZOLOFT) 100 MG tablet Take 100 mg by mouth at bedtime.    [provider]      Allergies    Patient has no known allergies.    Review of Systems   Review of Systems  Musculoskeletal:  Positive for back pain.  Neurological:  Positive for weakness and light-headedness.  All other systems reviewed and are negative.   Physical Exam Updated Vital Signs BP 119/67   Pulse 76   Temp 98.4 F (36.9 C) (Oral)   Resp 18   SpO2 98%  Physical Exam Vitals  and nursing note reviewed.  Constitutional:      General: She is not in acute distress.    Appearance: Normal appearance. She is normal weight. She is not ill-appearing, toxic-appearing or diaphoretic.  HENT:     Head: Normocephalic and atraumatic.     Mouth/Throat:     Mouth: Mucous membranes are dry.  Eyes:     Extraocular Movements: Extraocular movements intact.     Pupils: Pupils are equal, round, and reactive to light.  Cardiovascular:     Rate and Rhythm: Normal rate and regular rhythm.     Heart sounds:  Normal heart sounds.  Pulmonary:     Effort: Pulmonary effort is normal. No respiratory distress.     Breath sounds: Normal breath sounds.  Abdominal:     General: Abdomen is flat.     Palpations: Abdomen is soft.  Musculoskeletal:        General: Normal range of motion.     Cervical back: Normal range of motion.     Comments: 3+ pitting edema noted to the right lower extremity. DP and PT pulses intact and 2+. No overlying erythema or warmth. No palpable cord  Skin:    General: Skin is warm and dry.  Neurological:     General: No focal deficit present.     Mental Status: She is alert.  Psychiatric:        Mood and Affect: Mood normal.        Behavior: Behavior normal.     ED Results / Procedures / Treatments   Labs (all labs ordered are listed, but only abnormal results are displayed) Labs Reviewed  CBC WITH DIFFERENTIAL/PLATELET - Abnormal; Notable for the following components:      Result Value   RBC 3.35 (*)    Hemoglobin 7.7 (*)    HCT 26.5 (*)    MCV 79.1 (*)    MCH 23.0 (*)    MCHC 29.1 (*)    All other components within normal limits  COMPREHENSIVE METABOLIC PANEL - Abnormal; Notable for the following components:   Potassium 3.2 (*)    Chloride 93 (*)    CO2 38 (*)    Glucose, Bld 106 (*)    BUN 35 (*)    Creatinine, Ser 1.36 (*)    GFR, Estimated 39 (*)    All other components within normal limits  URINALYSIS, ROUTINE W REFLEX MICROSCOPIC - Abnormal; Notable for the following components:   Protein, ur TRACE (*)    All other components within normal limits  TROPONIN I (HIGH SENSITIVITY) - Abnormal; Notable for the following components:   Troponin I (High Sensitivity) 26 (*)    All other components within normal limits  TROPONIN I (HIGH SENSITIVITY)    EKG None  Radiology No results found.  Procedures Procedures    Medications Ordered in ED Medications  sodium chloride 0.9 % bolus 500 mL (500 mLs Intravenous New Bag/Given 03/19/22 1951)    ED  Course/ Medical Decision Making/ A&P                           Medical Decision Making Amount and/or Complexity of Data Reviewed Labs: ordered.  Risk Decision regarding hospitalization.   This patient presents to the ED for concern of hypotension and lightheadedness, this involves an extensive number of treatment options, and is a complaint that carries with it a high risk of complications and morbidity.    Co morbidities  that complicate the patient evaluation  hx asthma, hypertension, NSTEMI, COPD, and chronic back pain    Additional history obtained:  Additional history obtained from previous 3 ER visits in the past month  Lab Tests:  I Ordered, and personally interpreted labs.  The pertinent results include:  hemoglobin 7.7, K 3.2, creatinine 1.36 up from 1.03 12 days ago. BUN 18 --> 35, Troponin 26 consistent with previous   Problem List / ED Course / Critical interventions / Medication management  I ordered medication including fluids  for dehydration  Reevaluation of the patient after these medicines showed that the patient stayed the same I have reviewed the patients home medicines and have made adjustments as needed   Social Determinants of Health:  Patient is 71 and lives at home alone   Test / Admission - Considered:  Patient presents today with concern for hypotension at physical therapy earlier today.  She is afebrile, nontoxic-appearing, and in no acute distress. Blood pressures have been 120s/50s throughout her visit today, however when attempting orthostatic vital signs, patient states that she felt lightheaded with standing and her heartrate increased. She does have fairly significant anemia at 7.7 with unclear etiology for same. Hemoccult was negative at that time. Discussed findings with patient who does not feel that she is able to return home at this time given her general deconditioning and significant decreased oral intake related to her thrush that she  has been dealing with for the past few weeks. She is repeatedly requesting admission throughout her stay here. Given what appears to be symptomatic anemia as well as her mild AKI, will seek admission for same. Patient is understanding and in agreement.  Discussed patient with hospitalist Dr. Julian Reil who agrees to admit  This is a shared visit with supervising physician Dr. Deretha Emory who has independently evaluated patient & provided guidance in evaluation/management/disposition, in agreement with care    Final Clinical Impression(s) / ED Diagnoses Final diagnoses:  AKI (acute kidney injury) (HCC)  Symptomatic anemia    Rx / DC Orders ED Discharge Orders     None         Vear Clock 03/19/22 2142    Shalicia Craghead, Shawn Route, PA-C 03/19/22 2142    Vanetta Mulders, MD 03/23/22 1102

## 2022-03-19 NOTE — Assessment & Plan Note (Signed)
Assign to observation status. Will check iron studies. Pt admits to 1 episode about 1-2 weeks ago of bloody stools after she was constipated. No prior hx of GI bleed. Does not see GI specialist. Does not remember last colonoscopy. Will hold off on PRBC transfusion until daytime sees her in case there are other labs that want to be drawn. Will send type and screen.

## 2022-03-19 NOTE — Assessment & Plan Note (Signed)
Acute renal failure on CKD stage 3a.  Appears worse. Scr 1.36. pt states she sees [facility] but we do not have any records in Care Everywhere. Pt states she does not use any alias names. Hold HCTZ and Cozaar

## 2022-03-20 ENCOUNTER — Observation Stay (HOSPITAL_COMMUNITY): Payer: Medicare HMO

## 2022-03-20 DIAGNOSIS — I340 Nonrheumatic mitral (valve) insufficiency: Secondary | ICD-10-CM

## 2022-03-20 DIAGNOSIS — N1831 Chronic kidney disease, stage 3a: Secondary | ICD-10-CM

## 2022-03-20 DIAGNOSIS — D649 Anemia, unspecified: Secondary | ICD-10-CM | POA: Diagnosis not present

## 2022-03-20 DIAGNOSIS — Z66 Do not resuscitate: Secondary | ICD-10-CM | POA: Diagnosis not present

## 2022-03-20 DIAGNOSIS — N179 Acute kidney failure, unspecified: Secondary | ICD-10-CM

## 2022-03-20 DIAGNOSIS — R0602 Shortness of breath: Secondary | ICD-10-CM | POA: Diagnosis not present

## 2022-03-20 DIAGNOSIS — J449 Chronic obstructive pulmonary disease, unspecified: Secondary | ICD-10-CM

## 2022-03-20 DIAGNOSIS — R079 Chest pain, unspecified: Secondary | ICD-10-CM | POA: Diagnosis not present

## 2022-03-20 DIAGNOSIS — Z955 Presence of coronary angioplasty implant and graft: Secondary | ICD-10-CM

## 2022-03-20 HISTORY — DX: Acute kidney failure, unspecified: N17.9

## 2022-03-20 LAB — COMPREHENSIVE METABOLIC PANEL
ALT: 14 U/L (ref 0–44)
AST: 23 U/L (ref 15–41)
Albumin: 3.3 g/dL — ABNORMAL LOW (ref 3.5–5.0)
Alkaline Phosphatase: 87 U/L (ref 38–126)
Anion gap: 11 (ref 5–15)
BUN: 29 mg/dL — ABNORMAL HIGH (ref 8–23)
CO2: 34 mmol/L — ABNORMAL HIGH (ref 22–32)
Calcium: 9.3 mg/dL (ref 8.9–10.3)
Chloride: 96 mmol/L — ABNORMAL LOW (ref 98–111)
Creatinine, Ser: 1.2 mg/dL — ABNORMAL HIGH (ref 0.44–1.00)
GFR, Estimated: 45 mL/min — ABNORMAL LOW (ref >60)
Glucose, Bld: 104 mg/dL — ABNORMAL HIGH (ref 70–99)
Potassium: 2.9 mmol/L — ABNORMAL LOW (ref 3.5–5.1)
Sodium: 141 mmol/L (ref 135–145)
Total Bilirubin: 0.7 mg/dL (ref 0.3–1.2)
Total Protein: 7 g/dL (ref 6.5–8.1)

## 2022-03-20 LAB — CBC WITH DIFFERENTIAL/PLATELET
Abs Immature Granulocytes: 0.04 10*3/uL (ref 0.00–0.07)
Basophils Absolute: 0 10*3/uL (ref 0.0–0.1)
Basophils Relative: 0 %
Eosinophils Absolute: 0 10*3/uL (ref 0.0–0.5)
Eosinophils Relative: 0 %
HCT: 27.6 % — ABNORMAL LOW (ref 36.0–46.0)
Hemoglobin: 7.6 g/dL — ABNORMAL LOW (ref 12.0–15.0)
Immature Granulocytes: 1 %
Lymphocytes Relative: 16 %
Lymphs Abs: 1.1 10*3/uL (ref 0.7–4.0)
MCH: 23.6 pg — ABNORMAL LOW (ref 26.0–34.0)
MCHC: 27.5 g/dL — ABNORMAL LOW (ref 30.0–36.0)
MCV: 85.7 fL (ref 80.0–100.0)
Monocytes Absolute: 0.4 10*3/uL (ref 0.1–1.0)
Monocytes Relative: 6 %
Neutro Abs: 5.2 10*3/uL (ref 1.7–7.7)
Neutrophils Relative %: 77 %
Platelets: 187 10*3/uL (ref 150–400)
RBC: 3.22 MIL/uL — ABNORMAL LOW (ref 3.87–5.11)
RDW: 15.5 % (ref 11.5–15.5)
WBC: 6.8 10*3/uL (ref 4.0–10.5)
nRBC: 0 % (ref 0.0–0.2)

## 2022-03-20 LAB — ECHOCARDIOGRAM COMPLETE
Area-P 1/2: 4.6 cm2
Calc EF: 62.3 %
Height: 57 in
MV VTI: 1.88 cm2
S' Lateral: 2.9 cm
Single Plane A2C EF: 64.9 %
Single Plane A4C EF: 61.7 %
Weight: 1640.22 oz

## 2022-03-20 LAB — VITAMIN B12: Vitamin B-12: 2503 pg/mL — ABNORMAL HIGH (ref 180–914)

## 2022-03-20 LAB — IRON AND TIBC
Iron: 19 ug/dL — ABNORMAL LOW (ref 28–170)
Saturation Ratios: 5 % — ABNORMAL LOW (ref 10.4–31.8)
TIBC: 367 ug/dL (ref 250–450)
UIBC: 348 ug/dL

## 2022-03-20 LAB — FERRITIN: Ferritin: 6 ng/mL — ABNORMAL LOW (ref 11–307)

## 2022-03-20 LAB — TSH: TSH: 4.207 u[IU]/mL (ref 0.350–4.500)

## 2022-03-20 MED ORDER — MOMETASONE FURO-FORMOTEROL FUM 200-5 MCG/ACT IN AERO
2.0000 | INHALATION_SPRAY | Freq: Two times a day (BID) | RESPIRATORY_TRACT | Status: DC
  Administered 2022-03-20 – 2022-03-30 (×19): 2 via RESPIRATORY_TRACT
  Filled 2022-03-20: qty 8.8

## 2022-03-20 MED ORDER — ACETAMINOPHEN 325 MG PO TABS
650.0000 mg | ORAL_TABLET | Freq: Four times a day (QID) | ORAL | Status: DC | PRN
  Administered 2022-03-21 – 2022-03-29 (×5): 650 mg via ORAL
  Filled 2022-03-20 (×5): qty 2

## 2022-03-20 MED ORDER — ATORVASTATIN CALCIUM 40 MG PO TABS
40.0000 mg | ORAL_TABLET | Freq: Every day | ORAL | Status: DC
  Administered 2022-03-20 – 2022-03-30 (×11): 40 mg via ORAL
  Filled 2022-03-20 (×11): qty 1

## 2022-03-20 MED ORDER — ALBUTEROL SULFATE (2.5 MG/3ML) 0.083% IN NEBU: 2.5000 mg | INHALATION_SOLUTION | RESPIRATORY_TRACT | Status: DC | PRN

## 2022-03-20 MED ORDER — ONDANSETRON HCL 4 MG/2ML IJ SOLN: 4.0000 mg | Freq: Four times a day (QID) | INTRAMUSCULAR | Status: DC | PRN

## 2022-03-20 MED ORDER — OXYCODONE HCL 5 MG PO TABS
2.5000 mg | ORAL_TABLET | ORAL | Status: DC | PRN
  Administered 2022-03-20 – 2022-03-30 (×31): 2.5 mg via ORAL
  Filled 2022-03-20 (×32): qty 1

## 2022-03-20 MED ORDER — ONDANSETRON HCL 4 MG PO TABS: 4.0000 mg | ORAL_TABLET | Freq: Four times a day (QID) | ORAL | Status: DC | PRN

## 2022-03-20 MED ORDER — SERTRALINE HCL 100 MG PO TABS
100.0000 mg | ORAL_TABLET | Freq: Every day | ORAL | Status: DC
  Administered 2022-03-20 – 2022-03-29 (×10): 100 mg via ORAL
  Filled 2022-03-20 (×10): qty 1

## 2022-03-20 MED ORDER — ACETAMINOPHEN 650 MG RE SUPP: 650.0000 mg | Freq: Four times a day (QID) | RECTAL | Status: DC | PRN

## 2022-03-20 MED ORDER — ENSURE ENLIVE PO LIQD
237.0000 mL | Freq: Two times a day (BID) | ORAL | Status: DC
  Administered 2022-03-20 – 2022-03-21 (×4): 237 mL via ORAL

## 2022-03-20 MED ORDER — HEPARIN SODIUM (PORCINE) 5000 UNIT/ML IJ SOLN
5000.0000 [IU] | Freq: Three times a day (TID) | INTRAMUSCULAR | Status: DC
Start: 2022-03-20 — End: 2022-03-23
  Administered 2022-03-20 – 2022-03-21 (×5): 5000 [IU] via SUBCUTANEOUS
  Filled 2022-03-20 (×5): qty 1

## 2022-03-20 MED ORDER — TICAGRELOR 60 MG PO TABS
60.0000 mg | ORAL_TABLET | Freq: Two times a day (BID) | ORAL | Status: DC
Start: 2022-03-20 — End: 2022-03-21
  Administered 2022-03-20 – 2022-03-21 (×4): 60 mg via ORAL
  Filled 2022-03-20 (×4): qty 1

## 2022-03-20 MED ORDER — POTASSIUM CHLORIDE 10 MEQ/100ML IV SOLN
10.0000 meq | INTRAVENOUS | Status: AC
  Administered 2022-03-20 – 2022-03-21 (×5): 10 meq via INTRAVENOUS
  Filled 2022-03-20 (×2): qty 100

## 2022-03-20 MED ORDER — MELATONIN 5 MG PO TABS
10.0000 mg | ORAL_TABLET | Freq: Every evening | ORAL | Status: DC | PRN
  Administered 2022-03-20 – 2022-03-29 (×7): 10 mg via ORAL
  Filled 2022-03-20 (×8): qty 2

## 2022-03-20 MED ORDER — METOPROLOL SUCCINATE ER 25 MG PO TB24
12.5000 mg | ORAL_TABLET | Freq: Every day | ORAL | Status: DC
  Administered 2022-03-20 – 2022-03-30 (×11): 12.5 mg via ORAL
  Filled 2022-03-20 (×12): qty 1

## 2022-03-20 MED ORDER — LACTATED RINGERS IV SOLN: INTRAVENOUS | Status: AC

## 2022-03-20 NOTE — Assessment & Plan Note (Signed)
Likely due to poor po intake, po lasix, po ARB. Will hold lasix, ARB. Hydrate gentle overnight with LR @ 75 ml/hr. Repeat CMP in AM.

## 2022-03-20 NOTE — H&P (Addendum)
History and Physical    Julia Hernandez [id number] DOB: Dec 18, 1935 DOA: 03/19/2022  DOS: the patient was seen and examined on 03/19/2022  PCP: Kathyrn Lass, MD   Patient coming from: Home  I have personally briefly reviewed patient's old medical records in [place]  CC: chest pain, back pain, weakness HPI: 86 year old white female history of COPD, osteoporosis, coronary disease, CKD stage IIIa, history of mitral valve regurgitation, presents to the ER today with complaints of weakness, chest pain rating to the back.  This makes her fourth ER visit in the last 4 weeks.  She was seen twice at the [facility] ER on May 9 and May 16.  She was seen at the [facility], ER on May 27 and then again at the [place] ER today.  Patient had a CTPA done on May the 16,023 at the [facility] which demonstrated no PE.  She had T9 and T8 compression fractures.  There is also an L2 compression fracture.  Lower extremity ultrasounds were negative for DVT.  When she was seen in the [facility], ER on 03/07/2022, she also had another CTPA which was negative for PE.  This again demonstrated T8-T9 compression fractures and compression fractures from T11-L1.  Today she complained of chronic back pain.  Apparently patient was seen by physical therapy today and was noted to have systolic blood pressure in the 80s.  She was sent to the ER.  On arrival to the ER, temp 98.4 heart rate 72 blood pressure 125/57 satting 91% on room air.  Labs did show a white count of 7.8, hemoglobin 7.7, platelets of 213, MCV of 79  Chemistry sodium 142, potassium 3.2, bicarb 38, BUN of 35, creatinine 1.37  Patient states that she is continue to take her HCTZ due to lower extremity edema.  She is also continue to take Cozaar.  She also takes Brilinta 60 mg twice a day for history of coronary disease.  Due to the patient's continued anemia, she was transferred to [facility] for  further care.  Patient does recall about 2 weeks ago she did have some constipation.  When she was finally able to have a bowel movement, she had a bloody stool.  This was only one episode.  She has not had any bloody stools since then.  Patient states that she does not see a GI physician on a regular basis.  She has not had a colonoscopy in at least 5 years.     ED Course: Was not hypertensive in the ER.  Hemoglobin was 7.7.  Hemoccult was reportedly negative in the ER.  Review of Systems:  Review of Systems  Constitutional:  Positive for malaise/fatigue. Negative for chills and fever.  HENT: Negative.    Eyes: Negative.   Respiratory:  Positive for shortness of breath.   Cardiovascular:  Positive for chest pain and leg swelling.  Gastrointestinal:        1 episode of bloody stool after bout of constipation about 2 weeks ago  Genitourinary: Negative.   Musculoskeletal:  Positive for back pain.  Skin: Negative.   Neurological:  Positive for weakness.  Endo/Heme/Allergies: Negative.   Psychiatric/Behavioral: Negative.    All other systems reviewed and are negative.   Past Medical History:  Diagnosis Date   Asthma    Hypertension     History reviewed. No pertinent surgical history.   reports that she has been smoking cigarettes. She has been smoking an average  of 1 pack per day. She has never used smokeless tobacco. She reports that she does not drink alcohol and does not use drugs.  Allergies  Allergen Reactions   Influenza Vac Split Quad Shortness Of Breath   Ditropan Xl [Oxybutynin] Other (See Comments)    Unknown reaction   Simvastatin Rash    History reviewed. No pertinent family history.  Prior to Admission medications   Medication Sig Start Date End Date Taking? Authorizing Provider  acetaminophen (TYLENOL) 500 MG tablet Take 500 mg by mouth 5 (five) times daily.   Yes [provider]  acyclovir ointment (ZOVIRAX) 5 % Apply 1 application topically 3  (three) times daily as needed (outbreaks).   Yes [provider]  BRILINTA 60 MG TABS tablet Take 60 mg by mouth 2 (two) times daily. 03/02/22  Yes [provider]  chlorhexidine (PERIDEX) 0.12 % solution Use as directed 30 mLs in the mouth or throat See admin instructions. Rinse 55ml in mouth 2 to 3 times a day and spit out 03/10/22  Yes [provider]  diazepam (VALIUM) 5 MG tablet Take 1 tablet (5 mg total) by mouth at bedtime. Patient taking differently: Take 5 mg by mouth daily as needed for anxiety. 03/07/22  Yes Hayden Rasmussen, MD  furosemide (LASIX) 20 MG tablet Take 20 mg by mouth at bedtime. 02/27/22  Yes [provider]  Ipratropium-Albuterol (COMBIVENT RESPIMAT) 20-100 MCG/ACT AERS respimat Inhale 1 puff into the lungs 4 (four) times daily as needed for shortness of breath.   Yes [provider]  losartan (COZAAR) 100 MG tablet Take 100 mg by mouth daily.   Yes [provider]  magic mouthwash w/lidocaine SOLN Take 5 mLs by mouth 4 (four) times daily as needed for mouth pain.   Yes [provider]  sertraline (ZOLOFT) 100 MG tablet Take 100 mg by mouth at bedtime.   Yes [provider]  SYMBICORT 160-4.5 MCG/ACT inhaler Inhale 2 puffs into the lungs 2 (two) times daily. 03/02/22  Yes [provider]  atorvastatin (LIPITOR) 40 MG tablet Take 40 mg by mouth daily. 03/02/22   [provider]  metoprolol succinate (TOPROL-XL) 25 MG 24 hr tablet Take 12.5 mg by mouth daily. 03/02/22   [provider]    Physical Exam: Vitals:   03/19/22 2030 03/19/22 2130 03/19/22 2256 03/19/22 2326  BP: (!) 153/58 135/67 137/60   Pulse: 81 96 80   Resp: (!) 22 17 16    Temp:   98 F (36.7 C)   TempSrc:   Oral   SpO2: 92% 90% 91%   Weight:    46.5 kg  Height:    4\' 9"  (1.448 m)    Physical Exam Vitals and nursing note reviewed.  Constitutional:      General: She is not in acute distress.     Appearance: Normal appearance. She is not ill-appearing, toxic-appearing or diaphoretic.  HENT:     Head: Normocephalic and atraumatic.     Nose: Nose normal.  Eyes:     General: No scleral icterus. Cardiovascular:     Rate and Rhythm: Normal rate and regular rhythm.     Heart sounds: Murmur heard.  Pulmonary:     Effort: Pulmonary effort is normal. No respiratory distress.     Breath sounds: Normal breath sounds. No wheezing or rales.  Abdominal:     General: Abdomen is flat. Bowel sounds are normal. There is no distension.     Palpations:  Abdomen is soft.     Tenderness: There is no abdominal tenderness. There is no guarding.  Musculoskeletal:     Right lower leg: 2+ Edema present.     Left lower leg: 2+ Edema present.  Skin:    General: Skin is warm and dry.     Capillary Refill: Capillary refill takes less than 2 seconds.  Neurological:     General: No focal deficit present.     Mental Status: She is alert and oriented to person, place, and time.      Labs on Admission: I have personally reviewed following labs and imaging studies  CBC: Recent Labs  Lab 03/19/22 1749  WBC 7.8  NEUTROABS 6.2  HGB 7.7*  HCT 26.5*  MCV 79.1*  PLT [id number]   Basic Metabolic Panel: Recent Labs  Lab 03/19/22 1749  NA 142  K 3.2*  CL 93*  CO2 38*  GLUCOSE 106*  BUN 35*  CREATININE 1.36*  CALCIUM 9.8   GFR: Estimated Creatinine Clearance: 20.7 mL/min (A) (by C-G formula based on SCr of 1.36 mg/dL (H)). Liver Function Tests: Recent Labs  Lab 03/19/22 1749  AST 18  ALT 11  ALKPHOS 83  BILITOT 0.5  PROT 7.3  ALBUMIN 3.8   No results for input(s): "LIPASE", "AMYLASE" in the last 168 hours. No results for input(s): "AMMONIA" in the last 168 hours. Coagulation Profile: No results for input(s): "INR", "PROTIME" in the last 168 hours. Cardiac Enzymes: Recent Labs  Lab 03/19/22 1749  TROPONINIHS 26*   BNP (last 3 results) No results for input(s): "PROBNP" in the last 8760  hours. HbA1C: No results for input(s): "HGBA1C" in the last 72 hours. CBG: No results for input(s): "GLUCAP" in the last 168 hours. Lipid Profile: No results for input(s): "CHOL", "HDL", "LDLCALC", "TRIG", "CHOLHDL", "LDLDIRECT" in the last 72 hours. Thyroid Function Tests: No results for input(s): "TSH", "T4TOTAL", "FREET4", "T3FREE", "THYROIDAB" in the last 72 hours. Anemia Panel: No results for input(s): "VITAMINB12", "FOLATE", "FERRITIN", "TIBC", "IRON", "RETICCTPCT" in the last 72 hours. Urine analysis:    Component Value Date/Time   COLORURINE YELLOW 03/19/2022 [place] 03/19/2022 1749   LABSPEC 1.025 03/19/2022 1749   PHURINE 5.5 03/19/2022 [place] 03/19/2022 1749   HGBUR NEGATIVE 03/19/2022 1749   BILIRUBINUR NEGATIVE 03/19/2022 1749   KETONESUR NEGATIVE 03/19/2022 1749   PROTEINUR TRACE (A) 03/19/2022 1749   NITRITE NEGATIVE 03/19/2022 1749   LEUKOCYTESUR NEGATIVE 03/19/2022 1749    Radiological Exams on Admission: I have personally reviewed images No results found.  EKG: My personal interpretation of EKG shows: no EKG    Assessment/Plan Principal Problem:   Anemia Active Problems:   Wedge compression fracture of unspecified lumbar vertebra, initial encounter for closed fracture (HCC)   Stage 3a chronic kidney disease (CKD) (HCC)   Chronic obstructive pulmonary disease, unspecified (HCC)   Nonrheumatic mitral valve regurgitation   Presence of coronary angioplasty implant and graft   Tobacco abuse   DNR (do not resuscitate)/DNI(Do Not Intubate)   AKI (acute kidney injury) ([place])    Assessment and Plan: * Anemia Assign to observation status. Will check iron studies. Pt admits to 1 episode about 1-2 weeks ago of bloody stools after she was constipated. No prior hx of GI bleed. Does not see GI specialist. Does not remember last colonoscopy. Will hold off on PRBC transfusion until daytime sees her in case there are other labs that  want to be drawn. Will send  type and screen.  Wedge compression fracture of unspecified lumbar vertebra, initial encounter for closed fracture ([place]) Pt c/o of continue pain her chest that radiates to her back. Recent CTPA at the end of may 2023 shows multiple vertebral compressions fracture. No PE. Pt states oxycodone that was given to her from [place] [facility] did help but she is scared to take opiates.  Tobacco abuse Continues to smoke 3 cigarettes per day.  Presence of coronary angioplasty implant and graft Stable.   Nonrheumatic mitral valve regurgitation Chronic. Will update her echo.  Chronic obstructive pulmonary disease, unspecified (HCC) Chronic. Not exacerbated. Continue with symbicort inhalers.  Stage 3a chronic kidney disease (CKD) (HCC) Acute renal failure on CKD stage 3a.  Appears worse. Scr 1.36. pt states she sees [facility] but we do not have any records in Care Everywhere. Pt states she does not use any alias names. Hold HCTZ and Cozaar  AKI (acute kidney injury) ([place]) Likely due to poor po intake, po lasix, po ARB. Will hold lasix, ARB. Hydrate gentle overnight with LR @ 75 ml/hr. Repeat CMP in AM.  DNR (do not resuscitate)/DNI(Do Not Intubate) Verified with patient.   DVT prophylaxis: SQ Heparin Code Status: DNR/DNI(Do NOT Intubate) Family Communication: no family at bedside  Disposition Plan: return home  Consults called: none  Admission status: Observation, Med-Surg   Kristopher Oppenheim, DO [facility] 03/20/2022, 12:34 AM

## 2022-03-20 NOTE — Progress Notes (Signed)
  Echocardiogram 2D Echocardiogram has been performed.  Augustine Radar 03/20/2022, 1:59 PM

## 2022-03-20 NOTE — Progress Notes (Signed)
Subjective: Patient admitted this morning, see detailed H&P by Dr Bridgett Larsson 86 year old white female history of COPD, osteoporosis, CAD, CKD stage III, history of mitral valve regurgitation came to ER with complaints of generalized weakness, chest pain radiating to back.  She had a CTA done twice in the past 1 month which was negative for PE.  In the ED she was found to be hypotensive also found to have hemoglobin of 7.7. Patient admitted with anemia  Vitals:   03/20/22 0945 03/20/22 1207  BP: 126/84 135/63  Pulse: 80 88  Resp: 16 16  Temp: 98 F (36.7 C)   SpO2: 98% 97%      A/P Anemia -We will check B12 level, ferritin, serum iron, TIBC -Hemoglobin is 7.7 -FOBT was negative  Wedge compression fracture of unspecified lumbar vertebra -Continue pain management  Tobacco abuse Continues to smoke 3 cigarettes per day.   Presence of coronary angioplasty implant and graft Stable.    Nonrheumatic mitral valve regurgitation Chronic. Will update her echo.   Chronic obstructive pulmonary disease, unspecified (HCC) Chronic. Not exacerbated. Continue with symbicort inhalers.   Stage 3a chronic kidney disease (CKD) (HCC) Acute renal failure on CKD stage 3a.  Appears worse. Scr 1.36. pt states she sees [facility] but we do not have any records in Care Everywhere. Pt states she does not use any alias names. Hold HCTZ and Cozaar   AKI (acute kidney injury) ([place]) Likely due to poor po intake, po lasix, po ARB. Will hold lasix, ARB. Hydrate gentle overnight with LR @ 75 ml/hr. Repeat CMP in [place] [facility]

## 2022-03-20 NOTE — Evaluation (Signed)
Physical Therapy Evaluation Patient Details Name: Julia Hernandez MRN: [id number] DOB: Nov 12, 1935 Today's Date: 03/20/2022  History of Present Illness  Pt is an 86 y.o. female admitted for anemia and with complaints of weakness, chest pain rating to the back.  This makes her fourth ER visit in the last 4 weeks. Patient had a CTPA done on May the 16, 2023 at the [facility] -  She had T8/T9 and L2 compression fractures.  PMH significant for COPD, osteoporosis, coronary disease, CKD stage IIIa, and mitral valve regurgitation.   Clinical Impression  Pt is an 86 y.o. female with above HPI resulting in the deficits listed below (see PT Problem List). Pt is typically independent- has been using RW recently due to not feeling well/weakness. States her daughter comes by every day, sometimes multiple times to check in on her. Pt performed sit to stand transfers with supervision for safety and cues for safe hand placement. Pt ambulated total of ~46ft with MIN guard, decreased gait speed but no overt Lob observed. Pt will benefit from skilled PT to maximize functional mobility and increase independence.         Recommendations for follow up therapy are one component of a multi-disciplinary discharge planning process, led by the attending physician.  Recommendations may be updated based on patient status, additional functional criteria and insurance authorization.  Follow Up Recommendations Home health PT    Assistance Recommended at Discharge Intermittent Supervision/Assistance  Patient can return home with the following  A little help with walking and/or transfers;Assist for transportation;Assistance with cooking/housework;A little help with bathing/dressing/bathroom    Equipment Recommendations    Recommendations for Other Services  OT consult    Functional Status Assessment Patient has had a recent decline in their functional status and demonstrates the ability to make significant  improvements in function in a reasonable and predictable amount of time.     Precautions / Restrictions Precautions Precautions: Fall Restrictions Weight Bearing Restrictions: No      Mobility  Bed Mobility Overal bed mobility: Modified Independent             General bed mobility comments: HOB elevated, min use of bed rails    Transfers Overall transfer level: Needs assistance Equipment used: Rolling walker (2 wheels) Transfers: Sit to/from Stand Sit to Stand: Supervision           General transfer comment: supervision for safety, increased time to rise to full stand due to discomfort    Ambulation/Gait Ambulation/Gait assistance: Min guard Gait Distance (Feet): 16 Feet Assistive device: Rolling walker (2 wheels) Gait Pattern/deviations: Step-through pattern, Decreased stride length Gait velocity: decr     General Gait Details: no overt LOB observed, MIN guard for safety. States that her legs have been feeling a little weaket lately so she has been using AD around home and has not been driving/out shopping.  Stairs            Wheelchair Mobility    Modified Rankin (Stroke Patients Only)       Balance Overall balance assessment: Needs assistance Sitting-balance support: Feet supported Sitting balance-Leahy Scale: Fair     Standing balance support: Bilateral upper extremity supported, During functional activity Standing balance-Leahy Scale: Fair                               Pertinent Vitals/Pain Pain Assessment Pain Assessment: 0-10 Pain Score: 6  Pain Location: bilateral rib cage region  and radiating around back Pain Descriptors / Indicators: Pressure, Tightness ("feels like the tightest bra you ever put on") Pain Intervention(s): Monitored during session, Limited activity within patient's tolerance, Repositioned    Home Living Family/patient expects to be discharged to:: Private residence Living Arrangements:  Alone Available Help at Discharge: Family;Available PRN/intermittently Type of Home: House Home Access: Level entry       Home Layout: One level Home Equipment: Agricultural consultant (2 wheels);Rollator (4 wheels);Shower seat;Grab bars - tub/shower Additional Comments: daughter comes by every day, sometimes multiple times/day when able. Pt was doing HHPT, only had 1 visit before coming to ED. Denis history of falls.    Prior Function Prior Level of Function : Independent/Modified Independent;Driving             Mobility Comments: uses RW in home lately. no AD prior. ADLs Comments: daughter has been bringing by all meals recenlty since not feeling well.     Hand Dominance        Extremity/Trunk Assessment   Upper Extremity Assessment Upper Extremity Assessment: Generalized weakness    Lower Extremity Assessment Lower Extremity Assessment: Generalized weakness    Cervical / Trunk Assessment Cervical / Trunk Assessment: Kyphotic  Communication   Communication: No difficulties  Cognition Arousal/Alertness: Awake/alert Behavior During Therapy: WFL for tasks assessed/performed Overall Cognitive Status: Within Functional Limits for tasks assessed                                          General Comments      Exercises     Assessment/Plan    PT Assessment Patient needs continued PT services  PT Problem List Decreased strength;Decreased activity tolerance;Decreased balance;Decreased mobility;Pain       PT Treatment Interventions DME instruction;Gait training;Functional mobility training;Therapeutic activities;Therapeutic exercise;Balance training;Patient/family education    PT Goals (Current goals can be found in the Care Plan section)  Acute Rehab PT Goals Patient Stated Goal: feel better and get back home PT Goal Formulation: With patient Time For Goal Achievement: 04/03/22 Potential to Achieve Goals: Good    Frequency Min 4X/week      Co-evaluation               AM-PAC PT "6 Clicks" Mobility  Outcome Measure Help needed turning from your back to your side while in a flat bed without using bedrails?: A Little Help needed moving from lying on your back to sitting on the side of a flat bed without using bedrails?: A Lot Help needed moving to and from a bed to a chair (including a wheelchair)?: A Little Help needed standing up from a chair using your arms (e.g., wheelchair or bedside chair)?: A Little Help needed to walk in hospital room?: A Little Help needed climbing 3-5 steps with a railing? : A Little 6 Click Score: 17    End of Session Equipment Utilized During Treatment: Gait belt Activity Tolerance: Patient tolerated treatment well Patient left: in chair;with call bell/phone within reach;with chair alarm set Nurse Communication: Mobility status PT Visit Diagnosis: Unsteadiness on feet (R26.81);Muscle weakness (generalized) (M62.81);Pain Pain - Right/Left:  (right and left) Pain - part of body:  (back and rib cage)    Time: 9622-2979 PT Time Calculation (min) (ACUTE ONLY): 21 min   Charges:   PT Evaluation $PT Eval Low Complexity: 1 Low         Lyman Speller., PT, DPT  [place] [phone]  03/20/2022, 11:45 AM

## 2022-03-20 NOTE — Assessment & Plan Note (Signed)
Verified with patient

## 2022-03-21 ENCOUNTER — Encounter (HOSPITAL_COMMUNITY): Payer: Self-pay | Admitting: Internal Medicine

## 2022-03-21 DIAGNOSIS — D649 Anemia, unspecified: Secondary | ICD-10-CM | POA: Diagnosis not present

## 2022-03-21 DIAGNOSIS — Z955 Presence of coronary angioplasty implant and graft: Secondary | ICD-10-CM | POA: Diagnosis not present

## 2022-03-21 DIAGNOSIS — I340 Nonrheumatic mitral (valve) insufficiency: Secondary | ICD-10-CM | POA: Diagnosis not present

## 2022-03-21 DIAGNOSIS — N179 Acute kidney failure, unspecified: Secondary | ICD-10-CM | POA: Diagnosis not present

## 2022-03-21 LAB — COMPREHENSIVE METABOLIC PANEL
ALT: 15 U/L (ref 0–44)
AST: 24 U/L (ref 15–41)
Albumin: 3.2 g/dL — ABNORMAL LOW (ref 3.5–5.0)
Alkaline Phosphatase: 85 U/L (ref 38–126)
Anion gap: 8 (ref 5–15)
BUN: 19 mg/dL (ref 8–23)
CO2: 34 mmol/L — ABNORMAL HIGH (ref 22–32)
Calcium: 9 mg/dL (ref 8.9–10.3)
Chloride: 99 mmol/L (ref 98–111)
Creatinine, Ser: 0.88 mg/dL (ref 0.44–1.00)
GFR, Estimated: >60 mL/min (ref >60)
Glucose, Bld: 100 mg/dL — ABNORMAL HIGH (ref 70–99)
Potassium: 3.2 mmol/L — ABNORMAL LOW (ref 3.5–5.1)
Sodium: 141 mmol/L (ref 135–145)
Total Bilirubin: 0.7 mg/dL (ref 0.3–1.2)
Total Protein: 6.9 g/dL (ref 6.5–8.1)

## 2022-03-21 LAB — CBC
HCT: 26.5 % — ABNORMAL LOW (ref 36.0–46.0)
Hemoglobin: 7.5 g/dL — ABNORMAL LOW (ref 12.0–15.0)
MCH: 23.1 pg — ABNORMAL LOW (ref 26.0–34.0)
MCHC: 28.3 g/dL — ABNORMAL LOW (ref 30.0–36.0)
MCV: 81.5 fL (ref 80.0–100.0)
Platelets: 195 10*3/uL (ref 150–400)
RBC: 3.25 MIL/uL — ABNORMAL LOW (ref 3.87–5.11)
RDW: 14.8 % (ref 11.5–15.5)
WBC: 7.7 10*3/uL (ref 4.0–10.5)
nRBC: 0 % (ref 0.0–0.2)

## 2022-03-21 LAB — ABO/RH: ABO/RH(D): O POS

## 2022-03-21 MED ORDER — POTASSIUM CHLORIDE 10 MEQ/100ML IV SOLN
10.0000 meq | INTRAVENOUS | Status: AC
  Administered 2022-03-21 (×2): 10 meq via INTRAVENOUS
  Filled 2022-03-21 (×2): qty 100

## 2022-03-21 MED ORDER — POTASSIUM CHLORIDE 10 MEQ/100ML IV SOLN: 10.0000 meq | INTRAVENOUS | Status: DC

## 2022-03-21 NOTE — Consult Note (Signed)
Reason for Consult: Iron deficiency Referring Physician: Hospital team  Julia Hernandez is an 86 y.o. female.  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 No pertinent surgical history.  History reviewed. No pertinent family history.  Social History:  reports that she has been smoking cigarettes. She has been smoking an average of 1 pack per day. She has never used smokeless tobacco. She reports that she does not drink alcohol and does not use drugs.  Allergies:  Allergies  Allergen Reactions   Influenza Vac Split Quad Shortness Of Breath   Ditropan Xl [Oxybutynin] Other (See Comments)    Unknown reaction   Simvastatin Rash    Medications: I have reviewed the patient's current medications.  Results for orders placed or performed during the hospital encounter of 03/19/22 (from the past 48 hour(s))  CBC with Differential     Status: Abnormal   Collection Time: 03/19/22  5:49 PM  Result Value Ref Range   WBC 7.8 4.0 - 10.5 K/uL   RBC 3.35 (L) 3.87 - 5.11 MIL/uL   Hemoglobin 7.7 (L) 12.0 - 15.0 g/dL   HCT 55.3 (L) 74.8 - 27.0 %   MCV 79.1 (L) 80.0 - 100.0 fL   MCH 23.0 (L) 26.0 - 34.0 pg   MCHC 29.1 (L) 30.0 - 36.0 g/dL   RDW 78.6 75.4 - 49.2 %   Platelets 214 150 - 400 K/uL   nRBC 0.0 0.0 - 0.2 %   Neutrophils Relative % 79 %   Neutro Abs 6.2 1.7 - 7.7 K/uL   Lymphocytes Relative 14 %    Lymphs Abs 1.1 0.7 - 4.0 K/uL   Monocytes Relative 6 %   Monocytes Absolute 0.5 0.1 - 1.0 K/uL   Eosinophils Relative 0 %   Eosinophils Absolute 0.0 0.0 - 0.5 K/uL   Basophils Relative 0 %   Basophils Absolute 0.0 0.0 - 0.1 K/uL   Immature Granulocytes 1 %   Abs Immature Granulocytes 0.04 0.00 - 0.07 K/uL    Comment: Performed at [facility], [address] [place], [place] [address]  Comprehensive metabolic panel     Status: Abnormal   Collection Time: 03/19/22  5:49 PM  Result Value Ref Range   Sodium 142 135 - 145 mmol/L   Potassium 3.2 (L) 3.5 - 5.1 mmol/L   Chloride 93 (L) 98 - 111 mmol/L   CO2 38 (H) 22 - 32 mmol/L   Glucose, Bld 106 (H) 70 - 99 mg/dL    Comment: Glucose reference range applies only to samples taken after fasting for at least 8 hours.   BUN 35 (H) 8 - 23 mg/dL   Creatinine, Ser 1.21 (H) 0.44 - 1.00 mg/dL   Calcium 9.8 8.9 - 97.5 mg/dL   Total Protein 7.3 6.5 - 8.1 g/dL  Albumin 3.8 3.5 - 5.0 g/dL   AST 18 15 - 41 U/L   ALT 11 0 - 44 U/L   Alkaline Phosphatase 83 38 - 126 U/L   Total Bilirubin 0.5 0.3 - 1.2 mg/dL   GFR, Estimated 39 (L) >60 mL/min    Comment: (NOTE) Calculated using the CKD-EPI Creatinine Equation (2021)    Anion gap 11 5 - 15    Comment: Performed at [facility], [place], [place] [address]  Urinalysis, Routine w reflex microscopic Urine, Clean Catch     Status: Abnormal   Collection Time: 03/19/22  5:49 PM  Result Value Ref Range   Color, Urine YELLOW YELLOW   APPearance CLEAR CLEAR   Specific Gravity, Urine 1.025 1.005 - 1.030   pH 5.5 5.0 - 8.0   Glucose, UA NEGATIVE NEGATIVE mg/dL   Hgb urine dipstick NEGATIVE NEGATIVE   Bilirubin Urine NEGATIVE NEGATIVE   Ketones, ur NEGATIVE NEGATIVE mg/dL   Protein, ur TRACE (A) NEGATIVE mg/dL   Nitrite NEGATIVE NEGATIVE   Leukocytes,Ua NEGATIVE NEGATIVE   RBC / HPF 0-5 0 - 5 RBC/hpf   WBC, UA 0-5 0 - 5 WBC/hpf   Squamous  Epithelial / LPF 0-5 0 - 5    Comment: Performed at [facility], [address] [place], [place] [address]  Troponin I (High Sensitivity)     Status: Abnormal   Collection Time: 03/19/22  5:49 PM  Result Value Ref Range   Troponin I (High Sensitivity) 26 (H) <18 ng/L    Comment: (NOTE) Elevated high sensitivity troponin I (hsTnI) values and significant  changes across serial measurements may suggest ACS but many other  chronic and acute conditions are known to elevate hsTnI results.  Refer to the "Links" section for chest pain algorithms and additional  guidance. Performed at [facility], [address] [place], [place] [address]   CBC with Differential/Platelet     Status: Abnormal   Collection Time: 03/20/22  4:46 AM  Result Value Ref Range   WBC 6.8 4.0 - 10.5 K/uL   RBC 3.22 (L) 3.87 - 5.11 MIL/uL   Hemoglobin 7.6 (L) 12.0 - 15.0 g/dL   HCT 27.6 (L) 36.0 - 46.0 %   MCV 85.7 80.0 - 100.0 fL   MCH 23.6 (L) 26.0 - 34.0 pg   MCHC 27.5 (L) 30.0 - 36.0 g/dL   RDW 15.5 11.5 - 15.5 %   Platelets 187 150 - 400 K/uL   nRBC 0.0 0.0 - 0.2 %   Neutrophils Relative % 77 %   Neutro Abs 5.2 1.7 - 7.7 K/uL   Lymphocytes Relative 16 %   Lymphs Abs 1.1 0.7 - 4.0 K/uL   Monocytes Relative 6 %   Monocytes Absolute 0.4 0.1 - 1.0 K/uL   Eosinophils Relative 0 %   Eosinophils Absolute 0.0 0.0 - 0.5 K/uL   Basophils Relative 0 %   Basophils Absolute 0.0 0.0 - 0.1 K/uL   Immature Granulocytes 1 %   Abs Immature Granulocytes 0.04 0.00 - 0.07 K/uL    Comment: Performed at [facility], [place] [address], [place],  [address]  TSH     Status: None   Collection Time: 03/20/22  4:46 AM  Result Value Ref Range   TSH 4.207 0.350 - 4.500 uIU/mL    Comment: Performed by a 3rd Generation assay with a functional sensitivity of <=0.01 uIU/mL. Performed at [facility], [address] [place], [place] [address]   Iron  and TIBC     Status: Abnormal   Collection Time: 03/20/22 11:24 AM  Result Value Ref Range   Iron 19 (L) 28 - 170 ug/dL   TIBC 367 250 - 450 ug/dL   Saturation Ratios 5 (L) 10.4 - 31.8 %   UIBC 348 ug/dL    Comment: Performed at [facility], [place] [address], [place], [place] [address]  Comprehensive metabolic panel     Status: Abnormal   Collection Time: 03/20/22 11:24 AM  Result Value Ref Range   Sodium 141 135 - 145 mmol/L   Potassium 2.9 (L) 3.5 - 5.1 mmol/L   Chloride 96 (L) 98 - 111 mmol/L   CO2 34 (H) 22 - 32 mmol/L   Glucose, Bld 104 (H) 70 - 99 mg/dL    Comment: Glucose reference range applies only to samples taken after fasting for at least 8 hours.   BUN 29 (H) 8 - 23 mg/dL   Creatinine, Ser 1.20 (H) 0.44 - 1.00 mg/dL   Calcium 9.3 8.9 - 10.3 mg/dL   Total Protein 7.0 6.5 - 8.1 g/dL   Albumin 3.3 (L) 3.5 - 5.0 g/dL   AST 23 15 - 41 U/L   ALT 14 0 - 44 U/L   Alkaline Phosphatase 87 38 - 126 U/L   Total Bilirubin 0.7 0.3 - 1.2 mg/dL   GFR, Estimated 45 (L) >60 mL/min    Comment: (NOTE) Calculated using the CKD-EPI Creatinine Equation (2021)    Anion gap 11 5 - 15    Comment: Performed at [facility], [place] [address], [place], [place] [address]  Vitamin B12     Status: Abnormal   Collection Time: 03/20/22 11:24 AM  Result Value Ref Range   Vitamin B-12 2,503 (H) 180 - 914 pg/mL    Comment: RESULTS CONFIRMED BY MANUAL DILUTION (NOTE) This assay is not validated for testing neonatal or myeloproliferative syndrome specimens for Vitamin B12 levels. Performed at [facility], [place] [address] [place], [place] [address]   Ferritin     Status: Abnormal   Collection Time: 03/20/22 11:24 AM  Result Value Ref Range   Ferritin 6 (L) 11 - 307 ng/mL    Comment: Performed at [facility], [place] [address] [place], [place] [address]  Type and screen [place]     Status: None    Collection Time: 03/21/22  5:33 AM  Result Value Ref Range   ABO/RH(D) O POS    Antibody Screen NEG    Sample Expiration      03/24/2022,2359 Performed at [facility], [place] [address], [place], [place] [address]   CBC     Status: Abnormal   Collection Time: 03/21/22  5:33 AM  Result Value Ref Range   WBC 7.7 4.0 - 10.5 K/uL   RBC 3.25 (L) 3.87 - 5.11 MIL/uL   Hemoglobin 7.5 (L) 12.0 - 15.0 g/dL   HCT 26.5 (L) 36.0 - 46.0 %   MCV 81.5 80.0 - 100.0 fL   MCH 23.1 (L) 26.0 - 34.0 pg   MCHC 28.3 (L) 30.0 - 36.0 g/dL   RDW 14.8 11.5 - 15.5 %   Platelets 195 150 - 400 K/uL   nRBC 0.0 0.0 - 0.2 %    Comment: Performed at [facility], [place] [address] [place],  [address]  Comprehensive metabolic panel  Status: Abnormal   Collection Time: 03/21/22  5:33 AM  Result Value Ref Range   Sodium 141 135 - 145 mmol/L   Potassium 3.2 (L) 3.5 - 5.1 mmol/L   Chloride 99 98 - 111 mmol/L   CO2 34 (H) 22 - 32 mmol/L   Glucose, Bld 100 (H) 70 - 99 mg/dL    Comment: Glucose reference range applies only to samples taken after fasting for at least 8 hours.   BUN 19 8 - 23 mg/dL   Creatinine, Ser 0.88 0.44 - 1.00 mg/dL   Calcium 9.0 8.9 - 10.3 mg/dL   Total Protein 6.9 6.5 - 8.1 g/dL   Albumin 3.2 (L) 3.5 - 5.0 g/dL   AST 24 15 - 41 U/L   ALT 15 0 - 44 U/L   Alkaline Phosphatase 85 38 - 126 U/L   Total Bilirubin 0.7 0.3 - 1.2 mg/dL   GFR, Estimated >60 >60 mL/min    Comment: (NOTE) Calculated using the CKD-EPI Creatinine Equation (2021)    Anion gap 8 5 - 15    Comment: Performed at [facility], [place] [address] [place], [place] [address]  ABO/Rh     Status: None   Collection Time: 03/21/22  9:23 AM  Result Value Ref Range   ABO/RH(D)      O POS Performed at [facility], [place] [address] [place], [place] [address]     ECHOCARDIOGRAM COMPLETE  Result Date: 03/20/2022    ECHOCARDIOGRAM REPORT   Patient  Name:   LACARA BILLIG Date of Exam: 03/20/2022 Medical Rec #:  [id number]    Height:       57.0 in Accession #:    [id number]   Weight:       102.5 lb Date of Birth:  31-Oct-1935     BSA:          1.354 m Patient Age:    46 years     BP:           131/50 mmHg Patient Gender: F            HR:           88 bpm. Exam Location:  Inpatient Procedure: 2D Echo, Cardiac Doppler and Color Doppler Indications:    Mitral Valve Disorder I05.9  History:        Patient has no prior history of Echocardiogram examinations.                 COPD; Risk Factors:Current Smoker and Hypertension.  Sonographer:    Bernadene Person RDCS Referring Phys: [place]  1. Left ventricular ejection fraction, by estimation, is 55%. The left ventricle has normal function. The left ventricle has no regional wall motion abnormalities. Left ventricular diastolic parameters are consistent with Grade I diastolic dysfunction (impaired relaxation).  2. Right ventricular systolic function is normal. The right ventricular size is normal. There is normal pulmonary artery systolic pressure. The estimated right ventricular systolic pressure is Q000111Q mmHg.  3. The mitral valve is normal in structure. Trivial mitral valve regurgitation. No evidence of mitral stenosis.  4. The aortic valve is tricuspid. There is mild calcification of the aortic valve. Aortic valve regurgitation is not visualized. No aortic stenosis is present.  5. The inferior vena cava is normal in size with greater than 50% respiratory variability, suggesting right atrial pressure of 3 mmHg. FINDINGS  Left Ventricle: Left ventricular ejection fraction, by estimation, is 55%. The left  ventricle has normal function. The left ventricle has no regional wall motion abnormalities. The left ventricular internal cavity size was normal in size. There is no left ventricular hypertrophy. Left ventricular diastolic parameters are consistent with Grade I diastolic dysfunction (impaired relaxation).  Right Ventricle: The right ventricular size is normal. No increase in right ventricular wall thickness. Right ventricular systolic function is normal. There is normal pulmonary artery systolic pressure. The tricuspid regurgitant velocity is 2.12 m/s, and  with an assumed right atrial pressure of 3 mmHg, the estimated right ventricular systolic pressure is Q000111Q mmHg. Left Atrium: Left atrial size was normal in size. Right Atrium: Right atrial size was normal in size. Pericardium: There is no evidence of pericardial effusion. Mitral Valve: The mitral valve is normal in structure. Trivial mitral valve regurgitation. No evidence of mitral valve stenosis. MV peak gradient, 6.9 mmHg. The mean mitral valve gradient is 3.0 mmHg. Tricuspid Valve: The tricuspid valve is normal in structure. Tricuspid valve regurgitation is trivial. Aortic Valve: The aortic valve is tricuspid. There is mild calcification of the aortic valve. Aortic valve regurgitation is not visualized. No aortic stenosis is present. Pulmonic Valve: The pulmonic valve was normal in structure. Pulmonic valve regurgitation is not visualized. Aorta: The aortic root is normal in size and structure. Venous: The inferior vena cava is normal in size with greater than 50% respiratory variability, suggesting right atrial pressure of 3 mmHg. IAS/Shunts: No atrial level shunt detected by color flow Doppler.  LEFT VENTRICLE PLAX 2D LVIDd:         4.00 cm     Diastology LVIDs:         2.90 cm     LV e' medial:    6.48 cm/s LV PW:         1.00 cm     LV E/e' medial:  14.4 LV IVS:        1.00 cm     LV e' lateral:   7.27 cm/s LVOT diam:     1.90 cm     LV E/e' lateral: 12.8 LV SV:         69 LV SV Index:   51 LVOT Area:     2.84 cm  LV Volumes (MOD) LV vol d, MOD A2C: 64.9 ml LV vol d, MOD A4C: 56.6 ml LV vol s, MOD A2C: 22.8 ml LV vol s, MOD A4C: 21.7 ml LV SV MOD A2C:     42.1 ml LV SV MOD A4C:     56.6 ml LV SV MOD BP:      37.9 ml RIGHT VENTRICLE RV S prime:     10.10  cm/s TAPSE (M-mode): 1.9 cm LEFT ATRIUM             Index        RIGHT ATRIUM           Index LA diam:        2.10 cm 1.55 cm/m   RA Area:     11.70 cm LA Vol (A2C):   26.9 ml 19.87 ml/m  RA Volume:   26.50 ml  19.57 ml/m LA Vol (A4C):   18.9 ml 13.96 ml/m LA Biplane Vol: 23.4 ml 17.28 ml/m  AORTIC VALVE LVOT Vmax:   129.00 cm/s LVOT Vmean:  83.900 cm/s LVOT VTI:    0.242 m  AORTA Ao Root diam: 2.90 cm Ao Asc diam:  3.00 cm MITRAL VALVE  TRICUSPID VALVE MV Area (PHT): 4.60 cm     TR Peak grad:   18.0 mmHg MV Area VTI:   1.88 cm     TR Vmax:        212.00 cm/s MV Peak grad:  6.9 mmHg MV Mean grad:  3.0 mmHg     SHUNTS MV Vmax:       1.31 m/s     Systemic VTI:  0.24 m MV Vmean:      73.5 cm/s    Systemic Diam: 1.90 cm MV Decel Time: 165 msec MV E velocity: 93.40 cm/s MV A velocity: 132.00 cm/s MV E/A ratio:  0.71 Dalton McleanMD Electronically signed by Franki Monte Signature Date/Time: 03/20/2022/5:16:36 PM    Final     ROS negative except above Blood pressure (!) 156/91, pulse 80, temperature 97.9 F (36.6 C), temperature source Oral, resp. rate 18, height 4\' 9"  (1.448 m), weight 46.5 kg, SpO2 99 %. Physical Exam vital signs stable afebrile no acute distress abdomen is soft nontender BUN and creatinine slowly decreased to normal iron deficient hemoglobin low but stable reportedly guaiac negative at this time recent CT negative  Assessment/Plan: Iron deficiency and patient on blood thinner Plan: Would ask cardiology to reassess blood thinner and consider periodic IV iron since she does not tolerate p.o. and if an aspirin a day is needed would use a pump inhibitor to protect her stomach and she and I discussed GI work-up which I think we can do with holding blood thinner either as an inpatient or an outpatient but she would like to have her back dealt with first and please call me this weekend if I can be of any further assistance otherwise call rounding team next week if inpatient  work-up is requested or I am happy to see back as an outpatient when discharged  [facility] E 03/21/2022, 12:55 PM

## 2022-03-21 NOTE — Progress Notes (Signed)
OT Cancellation Note  Patient Details Name: Julia Hernandez MRN: [id number] DOB: August 09, 1936   Cancelled Treatment:    Reason Eval/Treat Not Completed: Patient declined, no reason specified Patient was approached earlier this AM with patient reporting she just took pain medication to come back. When therapist arrived at agreed upon time. Patient reported that she was just gotten comfortable and tired and did not want to move. OT to continue to follow and check back as schedule will allow.  Sharyn Blitz OTR/L, MS Acute Rehabilitation Department Office# [phone] Pager# [phone]  03/21/2022, 10:51 AM

## 2022-03-21 NOTE — Progress Notes (Signed)
Called and discussed with cardiologist on-call Dr. Rosemary Holms, he reviewed the records in care everywhere.  Patient had stent placed in 2020.  As per cardiology Brilinta can be stopped at this time.  No need to restart it.  Patient can be started on baby aspirin later, there is no urgency to start baby aspirin at this time.  Patient can undergo EGD if needed.

## 2022-03-21 NOTE — Progress Notes (Signed)
[facility]  PROGRESS NOTE  Julia Hernandez [id number] DOB: 28-Aug-1936 DOA: 03/19/2022 PCP: Kathyrn Lass, MD   Brief HPI:    86 year old white female history of COPD, osteoporosis, CAD, CKD stage III, history of mitral valve regurgitation came to ER with complaints of generalized weakness, chest pain radiating to back.  She had a CTA done twice in the past 1 month which was negative for PE.  In the ED she was found to be hypotensive also found to have hemoglobin of 7.7. Patient admitted with anemia   Subjective   Patient seen and examined, hemoglobin down to 7.7.  Has severe iron deficiency anemia with ferritin 6, iron saturation 5%.   Assessment/Plan:     Iron deficiency anemia -Patient states that she has history of intermittent black stools, has been on Brilinta for PCI in 2020 -Hemoglobin down to 7.7.  Hemoglobin in February 2020 was 11.0 -We will consult gastroenterology for possible EGD to look for gastric lesions as patient has been on Brilinta and aspirin for past 3 years -Transfuse for hemoglobin less than 7 -B12 level 2500  CAD -S/p angioplasty and graft in 2020 -Continue Brilinta -Echocardiogram showed EF 55% with grade 1 diastolic dysfunction  Wedge compression fracture of unspecified lumbar vertebra -Continue pain management  COPD -Chronic -Not in exacerbation  Acute kidney injury on CKD stage III yea -HCTZ and Cozaar on hold -Serum creatinine is down to 0.88 today -At baseline         Medications     atorvastatin  40 mg Oral Daily   feeding supplement  237 mL Oral BID BM   heparin  5,000 Units Subcutaneous Q8H   metoprolol succinate  12.5 mg Oral Daily   mometasone-formoterol  2 puff Inhalation BID   sertraline  100 mg Oral QHS   ticagrelor  60 mg Oral BID     Data Reviewed:   CBG:  No results for input(s): "GLUCAP" in the last 168 hours.  SpO2: 98 %    Vitals:   03/20/22 2146 03/21/22 0431 03/21/22 0726 03/21/22 1416  BP:  (!) 145/66 (!) 156/91  (!) 151/73  Pulse: 86 80  83  Resp: 18 18  18   Temp: 98.6 F (37 C) 97.9 F (36.6 C)  98 F (36.7 C)  TempSrc: Oral Oral  Oral  SpO2: 94% 100% 99% 98%  Weight:      Height:          Data Reviewed:  Basic Metabolic Panel: Recent Labs  Lab 03/19/22 1749 03/20/22 1124 03/21/22 0533  NA 142 141 141  K 3.2* 2.9* 3.2*  CL 93* 96* 99  CO2 38* 34* 34*  GLUCOSE 106* 104* 100*  BUN 35* 29* 19  CREATININE 1.36* 1.20* 0.88  CALCIUM 9.8 9.3 9.0    CBC: Recent Labs  Lab 03/19/22 1749 03/20/22 0446 03/21/22 0533  WBC 7.8 6.8 7.7  NEUTROABS 6.2 5.2  --   HGB 7.7* 7.6* 7.5*  HCT 26.5* 27.6* 26.5*  MCV 79.1* 85.7 81.5  PLT 214 187 195    LFT Recent Labs  Lab 03/19/22 1749 03/20/22 1124 03/21/22 0533  AST 18 23 24   ALT 11 14 15   ALKPHOS 83 87 85  BILITOT 0.5 0.7 0.7  PROT 7.3 7.0 6.9  ALBUMIN 3.8 3.3* 3.2*     Antibiotics: Anti-infectives (From admission, onward)    None        DVT prophylaxis: SCDs  Code Status: Full code  Family Communication: No  family at bedside   CONSULTS    Objective    Physical Examination:  General-appears in no acute distress Heart-S1-S2, regular, no murmur auscultated Lungs-clear to auscultation bilaterally, no wheezing or crackles auscultated Abdomen-soft, nontender, no organomegaly Extremities-no edema in the lower extremities Neuro-alert, oriented x3, no focal deficit noted  Status is: Inpatient: Iron-deficiency anemia           [place]   [facility] If 7PM-7AM, please contact night-coverage at [email or web address], Office  [phone]   03/21/2022, 2:44 PM  LOS: 0 days

## 2022-03-21 NOTE — TOC Initial Note (Signed)
Transition of Care [facility]) - Initial/Assessment Note    Patient Details  Name: Julia Hernandez MRN: [id number] Date of Birth: [birth year]  Transition of Care [facility]) CM/SW Contact:    Leeroy Cha, RN Phone Number: 03/21/2022, 8:34 AM  Clinical Narrative:                  Transition of Care [facility]) Screening Note   Patient Details  Name: Julia Hernandez Date of Birth: [birth year]   Transition of Care [facility]) CM/SW Contact:    Leeroy Cha, RN Phone Number: 03/21/2022, 8:34 AM    Transition of Care Department [facility]) has reviewed patient and no TOC needs have been identified at this time. We will continue to monitor patient advancement through interdisciplinary progression rounds. If new patient transition needs arise, please place a TOC consult.    Expected Discharge Plan: Home/Self Care Barriers to Discharge: Continued Medical Work up   Patient Goals and CMS Choice        Expected Discharge Plan and Services Expected Discharge Plan: Home/Self Care   Discharge Planning Services: CM Consult   Living arrangements for the past 2 months: Apartment                                      Prior Living Arrangements/Services Living arrangements for the past 2 months: Apartment Lives with:: Self Patient language and need for interpreter reviewed:: Yes Do you feel safe going back to the place where you live?: Yes            Criminal Activity/Legal Involvement Pertinent to Current Situation/Hospitalization: No - Comment as needed  Activities of Daily Living Home Assistive Devices/Equipment: Eyeglasses, Dentures (specify type), Cane (specify quad or straight) ADL Screening (condition at time of admission) Patient's cognitive ability adequate to safely complete daily activities?: Yes Is the patient deaf or have difficulty hearing?: No Does the patient have difficulty seeing, even when wearing glasses/contacts?: No Does the patient have difficulty concentrating,  remembering, or making decisions?: No Patient able to express need for assistance with ADLs?: Yes Does the patient have difficulty dressing or bathing?: Yes Independently performs ADLs?: No Communication: Independent Dressing (OT): Needs assistance Is this a change from baseline?: Change from baseline, expected to last <3days Grooming: Independent Feeding: Independent Bathing: Needs assistance Is this a change from baseline?: Change from baseline, expected to last <3 days Toileting: Needs assistance Is this a change from baseline?: Change from baseline, expected to last <3 days In/Out Bed: Needs assistance Is this a change from baseline?: Change from baseline, expected to last <3 days Walks in Home: Independent with device (comment) Does the patient have difficulty walking or climbing stairs?: Yes Weakness of Legs: Both Weakness of Arms/Hands: None  Permission Sought/Granted                  Emotional Assessment Appearance:: Appears stated age Attitude/Demeanor/Rapport: Gracious Affect (typically observed): Calm Orientation: : Oriented to Situation, Oriented to  Time, Oriented to Place, Oriented to Self Alcohol / Substance Use: Not Applicable Psych Involvement: No (comment)  Admission diagnosis:  AKI (acute kidney injury) ([place]) [N17.9] Symptomatic anemia [D64.9] Anemia [D64.9] Patient Active Problem List   Diagnosis Date Noted   DNR (do not resuscitate)/DNI(Do Not Intubate) 03/20/2022   AKI (acute kidney injury) ([place]) 03/20/2022   Anemia 03/19/2022   Tobacco abuse 03/19/2022   Stage 3a chronic kidney disease (CKD) ([place]) 08/20/2021  Presence of coronary angioplasty implant and graft 08/20/2021   Wedge compression fracture of unspecified lumbar vertebra, initial encounter for closed fracture ([place]) 08/20/2021   Nonrheumatic mitral valve regurgitation 11/11/2018   Chronic obstructive pulmonary disease, unspecified ([place]) 11/10/2018   PCP:  Kathyrn Lass, MD Pharmacy:    [facility] - [place], [place] - [address] [place] [address] Phone: [phone] Fax: [phone]     Social Determinants of Health (SDOH) Interventions    Readmission Risk Interventions     No data to display

## 2022-03-22 DIAGNOSIS — K921 Melena: Secondary | ICD-10-CM | POA: Diagnosis present

## 2022-03-22 DIAGNOSIS — S32000A Wedge compression fracture of unspecified lumbar vertebra, initial encounter for closed fracture: Secondary | ICD-10-CM | POA: Diagnosis present

## 2022-03-22 DIAGNOSIS — G8929 Other chronic pain: Secondary | ICD-10-CM | POA: Diagnosis present

## 2022-03-22 DIAGNOSIS — I252 Old myocardial infarction: Secondary | ICD-10-CM | POA: Diagnosis not present

## 2022-03-22 DIAGNOSIS — I340 Nonrheumatic mitral (valve) insufficiency: Secondary | ICD-10-CM | POA: Diagnosis not present

## 2022-03-22 DIAGNOSIS — Z79899 Other long term (current) drug therapy: Secondary | ICD-10-CM | POA: Diagnosis not present

## 2022-03-22 DIAGNOSIS — Z66 Do not resuscitate: Secondary | ICD-10-CM | POA: Diagnosis present

## 2022-03-22 DIAGNOSIS — K222 Esophageal obstruction: Secondary | ICD-10-CM | POA: Diagnosis present

## 2022-03-22 DIAGNOSIS — D649 Anemia, unspecified: Secondary | ICD-10-CM | POA: Diagnosis not present

## 2022-03-22 DIAGNOSIS — Z955 Presence of coronary angioplasty implant and graft: Secondary | ICD-10-CM | POA: Diagnosis not present

## 2022-03-22 DIAGNOSIS — N179 Acute kidney failure, unspecified: Secondary | ICD-10-CM | POA: Diagnosis present

## 2022-03-22 DIAGNOSIS — F17210 Nicotine dependence, cigarettes, uncomplicated: Secondary | ICD-10-CM | POA: Diagnosis present

## 2022-03-22 DIAGNOSIS — E440 Moderate protein-calorie malnutrition: Secondary | ICD-10-CM | POA: Diagnosis present

## 2022-03-22 DIAGNOSIS — T18128A Food in esophagus causing other injury, initial encounter: Secondary | ICD-10-CM | POA: Diagnosis not present

## 2022-03-22 DIAGNOSIS — I2510 Atherosclerotic heart disease of native coronary artery without angina pectoris: Secondary | ICD-10-CM | POA: Diagnosis present

## 2022-03-22 DIAGNOSIS — M549 Dorsalgia, unspecified: Secondary | ICD-10-CM | POA: Diagnosis present

## 2022-03-22 DIAGNOSIS — J449 Chronic obstructive pulmonary disease, unspecified: Secondary | ICD-10-CM | POA: Diagnosis present

## 2022-03-22 DIAGNOSIS — Z7951 Long term (current) use of inhaled steroids: Secondary | ICD-10-CM | POA: Diagnosis not present

## 2022-03-22 DIAGNOSIS — N1831 Chronic kidney disease, stage 3a: Secondary | ICD-10-CM | POA: Diagnosis present

## 2022-03-22 DIAGNOSIS — X58XXXA Exposure to other specified factors, initial encounter: Secondary | ICD-10-CM | POA: Diagnosis present

## 2022-03-22 DIAGNOSIS — Z7902 Long term (current) use of antithrombotics/antiplatelets: Secondary | ICD-10-CM | POA: Diagnosis not present

## 2022-03-22 DIAGNOSIS — Z7901 Long term (current) use of anticoagulants: Secondary | ICD-10-CM | POA: Diagnosis not present

## 2022-03-22 DIAGNOSIS — I083 Combined rheumatic disorders of mitral, aortic and tricuspid valves: Secondary | ICD-10-CM | POA: Diagnosis present

## 2022-03-22 DIAGNOSIS — I129 Hypertensive chronic kidney disease with stage 1 through stage 4 chronic kidney disease, or unspecified chronic kidney disease: Secondary | ICD-10-CM | POA: Diagnosis present

## 2022-03-22 DIAGNOSIS — M8088XA Other osteoporosis with current pathological fracture, vertebra(e), initial encounter for fracture: Secondary | ICD-10-CM | POA: Diagnosis present

## 2022-03-22 DIAGNOSIS — K5900 Constipation, unspecified: Secondary | ICD-10-CM | POA: Diagnosis present

## 2022-03-22 DIAGNOSIS — F39 Unspecified mood [affective] disorder: Secondary | ICD-10-CM | POA: Diagnosis present

## 2022-03-22 DIAGNOSIS — D509 Iron deficiency anemia, unspecified: Secondary | ICD-10-CM | POA: Diagnosis present

## 2022-03-22 LAB — BASIC METABOLIC PANEL
Anion gap: 8 (ref 5–15)
BUN: 12 mg/dL (ref 8–23)
CO2: 32 mmol/L (ref 22–32)
Calcium: 8.6 mg/dL — ABNORMAL LOW (ref 8.9–10.3)
Chloride: 99 mmol/L (ref 98–111)
Creatinine, Ser: 0.81 mg/dL (ref 0.44–1.00)
GFR, Estimated: >60 mL/min (ref >60)
Glucose, Bld: 89 mg/dL (ref 70–99)
Potassium: 3.5 mmol/L (ref 3.5–5.1)
Sodium: 139 mmol/L (ref 135–145)

## 2022-03-22 LAB — CBC
HCT: 24.8 % — ABNORMAL LOW (ref 36.0–46.0)
Hemoglobin: 7.1 g/dL — ABNORMAL LOW (ref 12.0–15.0)
MCH: 23.1 pg — ABNORMAL LOW (ref 26.0–34.0)
MCHC: 28.6 g/dL — ABNORMAL LOW (ref 30.0–36.0)
MCV: 80.8 fL (ref 80.0–100.0)
Platelets: 173 10*3/uL (ref 150–400)
RBC: 3.07 MIL/uL — ABNORMAL LOW (ref 3.87–5.11)
RDW: 14.8 % (ref 11.5–15.5)
WBC: 5.5 10*3/uL (ref 4.0–10.5)
nRBC: 0 % (ref 0.0–0.2)

## 2022-03-22 MED ORDER — MORPHINE SULFATE (PF) 2 MG/ML IV SOLN
0.5000 mg | Freq: Once | INTRAVENOUS | Status: AC
  Administered 2022-03-22: 0.5 mg via INTRAVENOUS
  Filled 2022-03-22: qty 1

## 2022-03-22 NOTE — Progress Notes (Signed)
FYI  Patient has refused Heparin shots for today and tomorrow morning with Hgb 7.1 and hx black stools, no S/S bleeding.  Refused SCD also.  Back pain managed with Tylenol and K-Pad at this point.  Thanks and have a good night.   On-Call updated and acknowledged.

## 2022-03-22 NOTE — Progress Notes (Signed)
Patient requesting pain meds. No others able to be given at this time. MD messaged. See new orders.

## 2022-03-22 NOTE — Progress Notes (Signed)
MD made aware heparin held for Hgb of 7.1.

## 2022-03-22 NOTE — Progress Notes (Signed)
OT Cancellation Note  Patient Details Name: Julia Hernandez MRN: [id number] DOB: Apr 07, 1936   Cancelled Treatment:    Reason Eval/Treat Not Completed: Patient declined, no reason specified Patient continues to decline to participate in session for various reasons but then will request therapy. Patient reported " I have not slept in 4 weeks" and " the morphine is the reason I cant get out of bed". Patient was extensively educated on importance of therapy and maintaining strength even with pain. Patient continues to decline to participate. OT signing off at this time. Please re order if patient becomes more participatory.   Jackelyn Poling OTR/L, [place] [facility] Office# [phone] Pager# [phone]  03/22/2022, 9:47 AM

## 2022-03-22 NOTE — Progress Notes (Signed)
[facility]  PROGRESS NOTE  Julia Hernandez [id number] DOB: 1936-08-22 DOA: 03/19/2022 PCP: Kathyrn Lass, MD   Brief HPI:    86 year old white female history of COPD, osteoporosis, CAD, CKD stage III, history of mitral valve regurgitation came to ER with complaints of generalized weakness, chest pain radiating to back.  She had a CTA done twice in the past 1 month which was negative for PE.  In the ED she was found to be hypotensive also found to have hemoglobin of 7.7. Patient admitted with anemia   Subjective   Patient seen and examined, denies any complaints.  Hemoglobin dropped to 7.1 this morning.   Assessment/Plan:     Iron deficiency anemia -Patient states that she has history of intermittent black stools, has been on Brilinta for PCI in 2020 -Hemoglobin has dropped down to 7.1.  It was 7.7 yesterday.  Hemoglobin in February 2020 was 11.0 -Gastroenterology was consulted; GI recommends to stop Brilinta -Brilinta has been stopped after discussion with cardiology She has been on Brilinta and aspirin for past 3 years -Transfuse for hemoglobin less than 7 -B12 level 2500  CAD -S/p angioplasty and graft in 2020 -Discussed with Dr. Virgina Jock; will discontinue Brilinta as patient has been on Brilinta for past 3 years after stent placement in 2020 -She can be started on baby aspirin once stable as per GI perspective -Echocardiogram showed EF 55% with grade 1 diastolic dysfunction  Wedge compression fracture of unspecified lumbar vertebra -Continue pain management  COPD -Chronic -Not in exacerbation  Acute kidney injury on CKD stage III yea -HCTZ and Cozaar on hold -Serum creatinine is down to 0.88 today -At baseline  Hypokalemia -Replete       Medications     atorvastatin  40 mg Oral Daily   feeding supplement  237 mL Oral BID BM   heparin  5,000 Units Subcutaneous Q8H   metoprolol succinate  12.5 mg Oral Daily   mometasone-formoterol  2 puff  Inhalation BID   sertraline  100 mg Oral QHS     Data Reviewed:   CBG:  No results for input(s): "GLUCAP" in the last 168 hours.  SpO2: 97 %    Vitals:   03/21/22 0726 03/21/22 1416 03/21/22 2129 03/22/22 0727  BP:  (!) 151/73 (!) 150/68   Pulse:  83 88   Resp:  18 18   Temp:  98 F (36.7 C) 98.8 F (37.1 C)   TempSrc:  Oral Oral   SpO2: 99% 98% 97% 97%  Weight:      Height:          Data Reviewed:  Basic Metabolic Panel: Recent Labs  Lab 03/19/22 1749 03/20/22 1124 03/21/22 0533 03/22/22 0508  NA 142 141 141 139  K 3.2* 2.9* 3.2* 3.5  CL 93* 96* 99 99  CO2 38* 34* 34* 32  GLUCOSE 106* 104* 100* 89  BUN 35* 29* 19 12  CREATININE 1.36* 1.20* 0.88 0.81  CALCIUM 9.8 9.3 9.0 8.6*    CBC: Recent Labs  Lab 03/19/22 1749 03/20/22 0446 03/21/22 0533 03/22/22 0508  WBC 7.8 6.8 7.7 5.5  NEUTROABS 6.2 5.2  --   --   HGB 7.7* 7.6* 7.5* 7.1*  HCT 26.5* 27.6* 26.5* 24.8*  MCV 79.1* 85.7 81.5 80.8  PLT 214 187 195 173    LFT Recent Labs  Lab 03/19/22 1749 03/20/22 1124 03/21/22 0533  AST 18 23 24   ALT 11 14 15   ALKPHOS 83 87 85  BILITOT 0.5 0.7 0.7  PROT 7.3 7.0 6.9  ALBUMIN 3.8 3.3* 3.2*     Antibiotics: Anti-infectives (From admission, onward)    None        DVT prophylaxis: SCDs  Code Status: Full code  Family Communication: No family at bedside   CONSULTS    Objective    Physical Examination:  General-appears in no acute distress Heart-S1-S2, regular, no murmur auscultated Lungs-clear to auscultation bilaterally, no wheezing or crackles auscultated Abdomen-soft, nontender, no organomegaly Extremities-no edema in the lower extremities Neuro-alert, oriented x3, no focal deficit noted  Status is: Inpatient: Iron-deficiency anemia           [place]   [facility] If 7PM-7AM, please contact night-coverage at [email or web address], Office  [phone]   03/22/2022, 12:36 PM  LOS: 0 days

## 2022-03-22 NOTE — Progress Notes (Signed)
Julia Hernandez 10:39 AM  Subjective: Patient seen and examined and case discussed with primary team and she has had no signs of bleeding and no bowel movement in 2 days and her blood thinner was stopped and she has no new complaints  Objective: Vital signs stable afebrile no acute distress abdomen is soft nontender hemoglobin minimal drop BUN decreased   Assessment: Anemia and patient on blood thinner  Plan: We discussed endoscopy which she is now agreeable to but will wait on rounding team tomorrow to see when they are comfortable to proceed off blood thinner and for an elective colonoscopy some people would wait 5 days however for an endoscopy hopefully they will feel more comfortable proceeding sooner and in the meantime continue back work-up  [facility] E  office [phone] After 5PM or if no answer call [phone]

## 2022-03-23 DIAGNOSIS — Z955 Presence of coronary angioplasty implant and graft: Secondary | ICD-10-CM | POA: Diagnosis not present

## 2022-03-23 DIAGNOSIS — I340 Nonrheumatic mitral (valve) insufficiency: Secondary | ICD-10-CM | POA: Diagnosis not present

## 2022-03-23 DIAGNOSIS — N179 Acute kidney failure, unspecified: Secondary | ICD-10-CM | POA: Diagnosis not present

## 2022-03-23 DIAGNOSIS — D649 Anemia, unspecified: Secondary | ICD-10-CM | POA: Diagnosis not present

## 2022-03-23 LAB — CBC
HCT: 23.8 % — ABNORMAL LOW (ref 36.0–46.0)
Hemoglobin: 6.9 g/dL — CL (ref 12.0–15.0)
MCH: 23.2 pg — ABNORMAL LOW (ref 26.0–34.0)
MCHC: 29 g/dL — ABNORMAL LOW (ref 30.0–36.0)
MCV: 80.1 fL (ref 80.0–100.0)
Platelets: 165 10*3/uL (ref 150–400)
RBC: 2.97 MIL/uL — ABNORMAL LOW (ref 3.87–5.11)
RDW: 14.9 % (ref 11.5–15.5)
WBC: 4.4 10*3/uL (ref 4.0–10.5)
nRBC: 0 % (ref 0.0–0.2)

## 2022-03-23 LAB — HEMOGLOBIN AND HEMATOCRIT, BLOOD
HCT: 26.5 % — ABNORMAL LOW (ref 36.0–46.0)
Hemoglobin: 7.7 g/dL — ABNORMAL LOW (ref 12.0–15.0)

## 2022-03-23 MED ORDER — BOOST / RESOURCE BREEZE PO LIQD CUSTOM
1.0000 | Freq: Three times a day (TID) | ORAL | Status: DC
  Administered 2022-03-23 – 2022-03-24 (×3): 1 via ORAL

## 2022-03-23 MED ORDER — SODIUM CHLORIDE 0.9% IV SOLUTION: Freq: Once | INTRAVENOUS | Status: AC

## 2022-03-23 MED ORDER — ADULT MULTIVITAMIN W/MINERALS CH
1.0000 | ORAL_TABLET | Freq: Every day | ORAL | Status: DC
  Administered 2022-03-23 – 2022-03-29 (×5): 1 via ORAL
  Filled 2022-03-23 (×8): qty 1

## 2022-03-23 MED ORDER — PANTOPRAZOLE SODIUM 40 MG PO TBEC
40.0000 mg | DELAYED_RELEASE_TABLET | Freq: Two times a day (BID) | ORAL | Status: DC
  Administered 2022-03-23: 40 mg via ORAL
  Filled 2022-03-23: qty 1

## 2022-03-23 MED ORDER — HEPARIN SODIUM (PORCINE) 5000 UNIT/ML IJ SOLN
5000.0000 [IU] | Freq: Three times a day (TID) | INTRAMUSCULAR | Status: DC
  Administered 2022-03-23: 5000 [IU] via SUBCUTANEOUS
  Filled 2022-03-23: qty 1

## 2022-03-23 MED ORDER — HEPARIN SODIUM (PORCINE) 5000 UNIT/ML IJ SOLN
5000.0000 [IU] | Freq: Three times a day (TID) | INTRAMUSCULAR | Status: DC
Start: 2022-03-23 — End: 2022-03-25
  Administered 2022-03-23 – 2022-03-25 (×4): 5000 [IU] via SUBCUTANEOUS
  Filled 2022-03-23 (×4): qty 1

## 2022-03-23 NOTE — H&P (View-Only) (Signed)
[facility] Progress Note  Julia Hernandez 86 y.o. 06/19/1936   Subjective: Patient seen and examined lying in bed discussed in depth procedure EGD.  Patient notes she is somewhat anxious for procedure.  Denies abdominal pain.  Denies bowel movement for the last 3 days.  Tolerating solid food diet well.  ROS : Review of Systems  Gastrointestinal:  Negative for abdominal pain, blood in stool, constipation, diarrhea, heartburn, melena, nausea and vomiting.  Genitourinary:  Negative for dysuria and urgency.      Objective: Vital signs in last 24 hours: Vitals:   03/23/22 0444 03/23/22 0825  BP: (!) 144/62   Pulse: 80   Resp: 16   Temp: (!) 97.5 F (36.4 C)   SpO2: 95% 96%    Physical Exam:  General:  Alert, cooperative, no distress, appears stated age  Head:  Normocephalic, without obvious abnormality, atraumatic  Eyes:  Anicteric sclera, EOM's intact  Lungs:   Clear to auscultation bilaterally, respirations unlabored  Heart:  Regular rate and rhythm, S1, S2 normal  Abdomen:   Soft, non-tender, bowel sounds active all four quadrants,  no masses,   Extremities: Extremities normal, atraumatic, no  edema  Pulses: 2+ and symmetric    Lab Results: Recent Labs    03/21/22 0533 03/22/22 0508  NA 141 139  K 3.2* 3.5  CL 99 99  CO2 34* 32  GLUCOSE 100* 89  BUN 19 12  CREATININE 0.88 0.81  CALCIUM 9.0 8.6*   Recent Labs    03/20/22 1124 03/21/22 0533  AST 23 24  ALT 14 15  ALKPHOS 87 85  BILITOT 0.7 0.7  PROT 7.0 6.9  ALBUMIN 3.3* 3.2*   Recent Labs    03/22/22 0508 03/23/22 0417 03/23/22 0825  WBC 5.5 4.4  --   HGB 7.1* 6.9* 7.7*  HCT 24.8* 23.8* 26.5*  MCV 80.8 80.1  --   PLT 173 165  --    No results for input(s): "LABPROT", "INR" in the last 72 hours.    Assessment Anemia Chronic anticoagulation  Hemoglobin 6.9 today, repeat hemoglobin 7.7.  No blood transfusions have occurred at this time.  Patient is on Brilinta 60 mg twice daily for  history of coronary disease.  Brilinta held 03/21/2022.  Per cardiology patient does not need to restart Brilinta and can be started on baby aspirin after procedure.  Patient denies melena or hematochezia in the last several days.  Noted occasional dark stools with taking her oral iron.  Patient does not tolerate oral iron well consider IV iron.   Plan: Plan for EGD 6/14. I thoroughly discussed the procedures to include nature, alternatives, benefits, and risks including but not limited to bleeding, perforation, infection, anesthesia/cardiac and pulmonary complications. Patient provides understanding and gave verbal consent to proceed. Continue current diet  NPO at midnight of 6/13 Continue anti-emetics and supportive care as needed. [facility] will follow.    Cande Mastropietro N Riese Hellard PA-C 03/23/2022, 10:12 AM  Contact #  [phone]  

## 2022-03-23 NOTE — Progress Notes (Signed)
Initial Nutrition Assessment  DOCUMENTATION CODES:   Non-severe (moderate) malnutrition in context of chronic illness  INTERVENTION:   -Ensure Plus High Protein po BID, each supplement provides 350 kcal and 20 grams of protein.   -While on clears: Boost Breeze po TID, each supplement provides 250 kcal and 9 grams of protein  -Multivitamin with minerals daily   NUTRITION DIAGNOSIS:   Moderate Malnutrition related to chronic illness, dysphagia (abdominal pain) as evidenced by moderate fat depletion, moderate muscle depletion.  GOAL:   Patient will meet greater than or equal to 90% of their needs  MONITOR:   PO intake, Supplement acceptance, Labs, Weight trends, I & O's  REASON FOR ASSESSMENT:   Malnutrition Screening Tool    ASSESSMENT:   86 year old white female history of COPD, osteoporosis, CAD, CKD stage III, history of mitral valve regurgitation came to ER with complaints of generalized weakness, chest pain radiating to back.  Admitted for anemia.  Patient reports she has not been eating that well for 3-4 years given constant stomach pain and she has developed some difficulty swallowing solid foods where food gets stuck. States her abdominal pain worsened around 4 weeks ago.  Pt on clears at this time, expected to have EGD in the morning.  Consumed 60% of meals yesterday.  Pt agreeable to protein supplements. States she takes Vitamin B-12, D and other vitamins. Not listed on home medications.  UBW is 110 lbs. 4 years ago she weighed ~145 lbs. Current weight: 99 lbs.  Medications reviewed.  Labs reviewed.  NUTRITION - FOCUSED PHYSICAL EXAM:  Flowsheet Row Most Recent Value  Orbital Region Mild depletion  Upper Arm Region Moderate depletion  Thoracic and Lumbar Region Unable to assess  [having pain in this area]  Temple Region Moderate depletion  Clavicle Bone Region Moderate depletion  Clavicle and Acromion Bone Region Moderate depletion  Scapular Bone  Region Unable to assess  Dorsal Hand Moderate depletion  Patellar Region Unable to assess  [sitting with legs reclined in chair]  Anterior Thigh Region Unable to assess  Posterior Calf Region Unable to assess  Edema (RD Assessment) Mild  Hair Reviewed  Eyes Reviewed  Mouth Reviewed  [missing teeth, red from jello]  Skin Reviewed       Diet Order:   Diet Order             Diet NPO time specified  Diet effective midnight           Diet clear liquid Room service appropriate? Yes; Fluid consistency: Thin  Diet effective now                   EDUCATION NEEDS:   No education needs have been identified at this time  Skin:  Skin Assessment: Reviewed RN Assessment  Last BM:  6/8  Height:   Ht Readings from Last 1 Encounters:  03/19/22 4\' 9"  (1.448 m)    Weight:   Wt Readings from Last 1 Encounters:  03/23/22 45.3 kg    BMI:  Body mass index is 21.61 kg/m.  Estimated Nutritional Needs:   Kcal:  1250-1450  Protein:  70-85g  Fluid:  1.5L/day   Clayton Bibles, MS, RD, LDN Inpatient Clinical Dietitian Contact information available via Amion

## 2022-03-23 NOTE — Progress Notes (Signed)
[facility]  PROGRESS NOTE  Julia Hernandez [id number] DOB: [birth year] DOA: 03/19/2022 PCP: Kathyrn Lass, MD   Brief HPI:    86 year old white female history of COPD, osteoporosis, CAD, CKD stage III, history of mitral valve regurgitation came to ER with complaints of generalized weakness, chest pain radiating to back.  She had a CTA done twice in the past 1 month which was negative for PE.  In the ED she was found to be hypotensive also found to have hemoglobin of 7.7. Patient admitted with anemia   Subjective   Patient seen and examined, denies any complaints.  Hemoglobin dropped to 6.9 initially, 1 unit of PRBC was ordered however repeat CBC showed hemoglobin of 7.7.  Blood transfusion on hold.   Assessment/Plan:     Iron deficiency anemia -Patient states that she has history of intermittent black stools, has been on Brilinta for PCI in 2020 -Hemoglobin has dropped down to 7.1.  It was 7.7 yesterday.  Hemoglobin in February 2020 was 11.0 -Gastroenterology was consulted; GI recommends to stop Brilinta -Brilinta has been stopped after discussion with cardiology She has been on Brilinta and aspirin for past 3 years -Transfuse for hemoglobin less than 7 -B12 level 2500 -This morning hemoglobin was 6.9, 1 unit PRBC was ordered however patient had repeat CBC done later today which showed hemoglobin of 7.7 so PRBC is on hold.  Melena -Patient gives intermittent history of black stool in the setting of taking Brilinta for past 3 years -Hemoglobin dropping in the hospital -Gastroenterology was consulted -Plan for EGD on 03/25/2022  CAD/hypertension -S/p angioplasty and graft in 2020 -Discussed with Dr. Virgina Jock; will discontinue Brilinta as patient has been on Brilinta for past 3 years after stent placement in 2020 -She can be started on baby aspirin once stable as per GI perspective -Echocardiogram showed EF 55% with grade 1 diastolic dysfunction -Continue  metoprolol  Wedge compression fracture of unspecified lumbar vertebra -Continue pain management  COPD -Chronic -Not in exacerbation -Continue mometasone formoterol" twice daily  Acute kidney injury on CKD stage III a -HCTZ and Cozaar on hold -Serum creatinine is down to 0.88 today -At baseline  Hypokalemia -Replete       Medications     atorvastatin  40 mg Oral Daily   feeding supplement  237 mL Oral BID BM   heparin  5,000 Units Subcutaneous Q8H   metoprolol succinate  12.5 mg Oral Daily   mometasone-formoterol  2 puff Inhalation BID   pantoprazole  40 mg Oral BID AC   sertraline  100 mg Oral QHS     Data Reviewed:   CBG:  No results for input(s): "GLUCAP" in the last 168 hours.  SpO2: 100 %    Vitals:   03/23/22 0444 03/23/22 0500 03/23/22 0825 03/23/22 1332  BP: (!) 144/62   137/62  Pulse: 80   82  Resp: 16   14  Temp: (!) 97.5 F (36.4 C)   97.8 F (36.6 C)  TempSrc: Oral   Oral  SpO2: 95%  96% 100%  Weight:  45.3 kg    Height:          Data Reviewed:  Basic Metabolic Panel: Recent Labs  Lab 03/19/22 1749 03/20/22 1124 03/21/22 0533 03/22/22 0508  NA 142 141 141 139  K 3.2* 2.9* 3.2* 3.5  CL 93* 96* 99 99  CO2 38* 34* 34* 32  GLUCOSE 106* 104* 100* 89  BUN 35* 29* 19 12  CREATININE 1.36* 1.20*  0.88 0.81  CALCIUM 9.8 9.3 9.0 8.6*    CBC: Recent Labs  Lab 03/19/22 1749 03/20/22 0446 03/21/22 0533 03/22/22 0508 03/23/22 0417 03/23/22 0825  WBC 7.8 6.8 7.7 5.5 4.4  --   NEUTROABS 6.2 5.2  --   --   --   --   HGB 7.7* 7.6* 7.5* 7.1* 6.9* 7.7*  HCT 26.5* 27.6* 26.5* 24.8* 23.8* 26.5*  MCV 79.1* 85.7 81.5 80.8 80.1  --   PLT 214 187 195 173 165  --     LFT Recent Labs  Lab 03/19/22 1749 03/20/22 1124 03/21/22 0533  AST 18 23 24   ALT 11 14 15   ALKPHOS 83 87 85  BILITOT 0.5 0.7 0.7  PROT 7.3 7.0 6.9  ALBUMIN 3.8 3.3* 3.2*     Antibiotics: Anti-infectives (From admission, onward)    None        DVT  prophylaxis: Heparin started on 03/23/2022  Code Status: Full code  Family Communication: No family at bedside   CONSULTS    Objective    Physical Examination:  General-appears in no acute distress Heart-S1-S2, regular, no murmur auscultated Lungs-clear to auscultation bilaterally, no wheezing or crackles auscultated Abdomen-soft, nontender, no organomegaly Extremities-no edema in the lower extremities Neuro-alert, oriented x3, no focal deficit noted  Status is: Inpatient: Iron-deficiency anemia           [place]   [facility] If 7PM-7AM, please contact night-coverage at [email or web address], Office  [phone]   03/23/2022, 2:33 PM  LOS: 1 day

## 2022-03-23 NOTE — Progress Notes (Signed)
Physical Therapy Treatment Patient Details Name: Julia Hernandez MRN: [id number] DOB: [birth year] Today's Date: 03/23/2022   History of Present Illness Pt is an 86 y.o. female admitted for anemia and with complaints of weakness, chest pain rating to the back.  This makes her fourth ER visit in the last 4 weeks. Patient had a CTPA done on May the 16, 2023 at the [facility] -  She had T8/T9 and L2 compression fractures.  PMH significant for COPD, osteoporosis, coronary disease, CKD stage IIIa, and mitral valve regurgitation.    PT Comments    Noted Hgb 6.9, pt to receive a transfusion today. Pt was able to take a few pivotal steps with RW to a recliner, but further ambulation was deferred 2* pt feeling lightheaded in standing. Pt performed seated BUE/LE strengthening exercises.     Recommendations for follow up therapy are one component of a multi-disciplinary discharge planning process, led by the attending physician.  Recommendations may be updated based on patient status, additional functional criteria and insurance authorization.  Follow Up Recommendations  Home health PT     Assistance Recommended at Discharge Intermittent Supervision/Assistance  Patient can return home with the following A little help with walking and/or transfers;Assist for transportation;Assistance with cooking/housework;A little help with bathing/dressing/bathroom   Equipment Recommendations  None recommended by PT    Recommendations for Other Services       Precautions / Restrictions Precautions Precautions: Fall Restrictions Weight Bearing Restrictions: No     Mobility  Bed Mobility Overal bed mobility: Modified Independent             General bed mobility comments: HOB elevated, min use of bed rails    Transfers Overall transfer level: Needs assistance Equipment used: Rolling walker (2 wheels) Transfers: Sit to/from Stand, Bed to chair/wheelchair/BSC Sit to Stand: Supervision    Step pivot transfers: Min guard       General transfer comment: supervision for safety, increased time to rise to full stand due to discomfort, VCs hand placement; pt took several steps to recliner with RW    Ambulation/Gait               General Gait Details: ambulation deferred 2* pt lightheaded in standing, Hgb 6.9, pt to receive transfusion today.   Stairs             Wheelchair Mobility    Modified Rankin (Stroke Patients Only)       Balance Overall balance assessment: Needs assistance Sitting-balance support: Feet supported Sitting balance-Leahy Scale: Fair     Standing balance support: Bilateral upper extremity supported, During functional activity Standing balance-Leahy Scale: Fair                              Cognition Arousal/Alertness: Awake/alert Behavior During Therapy: WFL for tasks assessed/performed Overall Cognitive Status: Within Functional Limits for tasks assessed                                          Exercises General Exercises - Upper Extremity Shoulder Flexion: AROM, Both, 10 reps, Seated General Exercises - Lower Extremity Ankle Circles/Pumps: AROM, Both, 15 reps, Supine Long Arc Quad: Both, 15 reps, Seated Hip Flexion/Marching: AROM, Both, 15 reps, Seated    General Comments        Pertinent Vitals/Pain Pain Assessment Pain Score: 5  Pain Location: bilateral rib cage region and radiating around back (pt reports this is chronic) Pain Descriptors / Indicators: Aching Pain Intervention(s): Limited activity within patient's tolerance, Monitored during session, Repositioned, Premedicated before session    Home Living                          Prior Function            PT Goals (current goals can now be found in the care plan section) Acute Rehab PT Goals Patient Stated Goal: feel better and get back home PT Goal Formulation: With patient Time For Goal Achievement:  04/03/22 Potential to Achieve Goals: Good Progress towards PT goals: Progressing toward goals    Frequency           PT Plan Current plan remains appropriate    Co-evaluation              AM-PAC PT "6 Clicks" Mobility   Outcome Measure  Help needed turning from your back to your side while in a flat bed without using bedrails?: A Little Help needed moving from lying on your back to sitting on the side of a flat bed without using bedrails?: A Little Help needed moving to and from a bed to a chair (including a wheelchair)?: A Little Help needed standing up from a chair using your arms (e.g., wheelchair or bedside chair)?: A Little Help needed to walk in hospital room?: A Little Help needed climbing 3-5 steps with a railing? : A Little 6 Click Score: 18    End of Session Equipment Utilized During Treatment: Gait belt Activity Tolerance: Patient tolerated treatment well Patient left: in chair;with call bell/phone within reach;with chair alarm set Nurse Communication: Mobility status PT Visit Diagnosis: Unsteadiness on feet (R26.81);Muscle weakness (generalized) (M62.81);Pain     Time: [id number] PT Time Calculation (min) (ACUTE ONLY): 12 min  Charges:  $Therapeutic Activity: 8-22 mins                     Blondell Reveal Kistler PT 03/23/2022  Acute Rehabilitation Services Pager [phone] Office [phone]

## 2022-03-23 NOTE — Progress Notes (Signed)
Blood transfusion canceled per Dr. Sharl Ma due to HGB of 7.7

## 2022-03-23 NOTE — Progress Notes (Signed)
[facility] Progress Note  Julia Hernandez 86 y.o. 1936/06/27   Subjective: Patient seen and examined lying in bed discussed in depth procedure EGD.  Patient notes she is somewhat anxious for procedure.  Denies abdominal pain.  Denies bowel movement for the last 3 days.  Tolerating solid food diet well.  ROS : Review of Systems  Gastrointestinal:  Negative for abdominal pain, blood in stool, constipation, diarrhea, heartburn, melena, nausea and vomiting.  Genitourinary:  Negative for dysuria and urgency.      Objective: Vital signs in last 24 hours: Vitals:   03/23/22 0444 03/23/22 0825  BP: (!) 144/62   Pulse: 80   Resp: 16   Temp: (!) 97.5 F (36.4 C)   SpO2: 95% 96%    Physical Exam:  General:  Alert, cooperative, no distress, appears stated age  Head:  Normocephalic, without obvious abnormality, atraumatic  Eyes:  Anicteric sclera, EOM's intact  Lungs:   Clear to auscultation bilaterally, respirations unlabored  Heart:  Regular rate and rhythm, S1, S2 normal  Abdomen:   Soft, non-tender, bowel sounds active all four quadrants,  no masses,   Extremities: Extremities normal, atraumatic, no  edema  Pulses: 2+ and symmetric    Lab Results: Recent Labs    03/21/22 0533 03/22/22 0508  NA 141 139  K 3.2* 3.5  CL 99 99  CO2 34* 32  GLUCOSE 100* 89  BUN 19 12  CREATININE 0.88 0.81  CALCIUM 9.0 8.6*   Recent Labs    03/20/22 1124 03/21/22 0533  AST 23 24  ALT 14 15  ALKPHOS 87 85  BILITOT 0.7 0.7  PROT 7.0 6.9  ALBUMIN 3.3* 3.2*   Recent Labs    03/22/22 0508 03/23/22 0417 03/23/22 0825  WBC 5.5 4.4  --   HGB 7.1* 6.9* 7.7*  HCT 24.8* 23.8* 26.5*  MCV 80.8 80.1  --   PLT 173 165  --    No results for input(s): "LABPROT", "INR" in the last 72 hours.    Assessment Anemia Chronic anticoagulation  Hemoglobin 6.9 today, repeat hemoglobin 7.7.  No blood transfusions have occurred at this time.  Patient is on Brilinta 60 mg twice daily for  history of coronary disease.  Brilinta held 03/21/2022.  Per cardiology patient does not need to restart Brilinta and can be started on baby aspirin after procedure.  Patient denies melena or hematochezia in the last several days.  Noted occasional dark stools with taking her oral iron.  Patient does not tolerate oral iron well consider IV iron.   Plan: Plan for EGD 6/14. I thoroughly discussed the procedures to include nature, alternatives, benefits, and risks including but not limited to bleeding, perforation, infection, anesthesia/cardiac and pulmonary complications. Patient provides understanding and gave verbal consent to proceed. Continue current diet  NPO at midnight of 6/13 Continue anti-emetics and supportive care as needed. [facility] will follow.    Julia Hernandez Nithin Demeo PA-C 03/23/2022, 10:12 AM  Contact #  [phone]

## 2022-03-24 ENCOUNTER — Inpatient Hospital Stay (HOSPITAL_COMMUNITY): Payer: Medicare HMO | Admitting: Certified Registered"

## 2022-03-24 ENCOUNTER — Encounter (HOSPITAL_COMMUNITY)
Admission: EM | Disposition: A | Discharge status: Skilled Nursing Facility | Payer: Self-pay | Source: Home / Self Care | Attending: Internal Medicine

## 2022-03-24 ENCOUNTER — Encounter (HOSPITAL_COMMUNITY): Payer: Self-pay | Admitting: Family Medicine

## 2022-03-24 DIAGNOSIS — D509 Iron deficiency anemia, unspecified: Principal | ICD-10-CM

## 2022-03-24 DIAGNOSIS — I340 Nonrheumatic mitral (valve) insufficiency: Secondary | ICD-10-CM

## 2022-03-24 DIAGNOSIS — T18128A Food in esophagus causing other injury, initial encounter: Secondary | ICD-10-CM

## 2022-03-24 DIAGNOSIS — N1831 Chronic kidney disease, stage 3a: Secondary | ICD-10-CM | POA: Diagnosis not present

## 2022-03-24 DIAGNOSIS — K222 Esophageal obstruction: Secondary | ICD-10-CM

## 2022-03-24 DIAGNOSIS — E440 Moderate protein-calorie malnutrition: Secondary | ICD-10-CM | POA: Insufficient documentation

## 2022-03-24 DIAGNOSIS — Z955 Presence of coronary angioplasty implant and graft: Secondary | ICD-10-CM | POA: Diagnosis not present

## 2022-03-24 DIAGNOSIS — J449 Chronic obstructive pulmonary disease, unspecified: Secondary | ICD-10-CM | POA: Diagnosis not present

## 2022-03-24 HISTORY — PX: BALLOON DILATION: SHX5330

## 2022-03-24 HISTORY — PX: ESOPHAGOGASTRODUODENOSCOPY: SHX5428

## 2022-03-24 LAB — COMPREHENSIVE METABOLIC PANEL
ALT: 13 U/L (ref 0–44)
AST: 21 U/L (ref 15–41)
Albumin: 3 g/dL — ABNORMAL LOW (ref 3.5–5.0)
Alkaline Phosphatase: 73 U/L (ref 38–126)
Anion gap: 7 (ref 5–15)
BUN: 17 mg/dL (ref 8–23)
CO2: 30 mmol/L (ref 22–32)
Calcium: 8.9 mg/dL (ref 8.9–10.3)
Chloride: 104 mmol/L (ref 98–111)
Creatinine, Ser: 0.99 mg/dL (ref 0.44–1.00)
GFR, Estimated: 57 mL/min — ABNORMAL LOW (ref >60)
Glucose, Bld: 88 mg/dL (ref 70–99)
Potassium: 3.8 mmol/L (ref 3.5–5.1)
Sodium: 141 mmol/L (ref 135–145)
Total Bilirubin: 0.6 mg/dL (ref 0.3–1.2)
Total Protein: 6.2 g/dL — ABNORMAL LOW (ref 6.5–8.1)

## 2022-03-24 LAB — CBC
HCT: 25.1 % — ABNORMAL LOW (ref 36.0–46.0)
Hemoglobin: 7.1 g/dL — ABNORMAL LOW (ref 12.0–15.0)
MCH: 22.8 pg — ABNORMAL LOW (ref 26.0–34.0)
MCHC: 28.3 g/dL — ABNORMAL LOW (ref 30.0–36.0)
MCV: 80.4 fL (ref 80.0–100.0)
Platelets: 167 10*3/uL (ref 150–400)
RBC: 3.12 MIL/uL — ABNORMAL LOW (ref 3.87–5.11)
RDW: 15.1 % (ref 11.5–15.5)
WBC: 5.9 10*3/uL (ref 4.0–10.5)
nRBC: 0 % (ref 0.0–0.2)

## 2022-03-24 LAB — PREPARE RBC (CROSSMATCH)

## 2022-03-24 SURGERY — EGD (ESOPHAGOGASTRODUODENOSCOPY)
Anesthesia: Monitor Anesthesia Care

## 2022-03-24 MED ORDER — MORPHINE SULFATE (PF) 2 MG/ML IV SOLN
1.0000 mg | Freq: Once | INTRAVENOUS | Status: AC | PRN
  Administered 2022-03-24: 1 mg via INTRAVENOUS
  Filled 2022-03-24: qty 1

## 2022-03-24 MED ORDER — LIDOCAINE 2% (20 MG/ML) 5 ML SYRINGE
INTRAMUSCULAR | Status: DC | PRN
  Administered 2022-03-24: 60 mg via INTRAVENOUS

## 2022-03-24 MED ORDER — ASPIRIN 81 MG PO TBEC
81.0000 mg | DELAYED_RELEASE_TABLET | Freq: Every day | ORAL | Status: DC
  Administered 2022-03-25 – 2022-03-30 (×6): 81 mg via ORAL
  Filled 2022-03-24 (×6): qty 1

## 2022-03-24 MED ORDER — SODIUM CHLORIDE 0.9 % IV SOLN: INTRAVENOUS | Status: DC

## 2022-03-24 MED ORDER — PHENOL 1.4 % MT LIQD
1.0000 | OROMUCOSAL | Status: DC | PRN
Start: 2022-03-24 — End: 2022-03-30
  Administered 2022-03-24: 1 via OROMUCOSAL
  Filled 2022-03-24: qty 177

## 2022-03-24 MED ORDER — PROPOFOL 500 MG/50ML IV EMUL
INTRAVENOUS | Status: AC
  Filled 2022-03-24: qty 50

## 2022-03-24 MED ORDER — PROPOFOL 500 MG/50ML IV EMUL
INTRAVENOUS | Status: DC | PRN
  Administered 2022-03-24: 100 ug/kg/min via INTRAVENOUS

## 2022-03-24 MED ORDER — LACTATED RINGERS IV SOLN: INTRAVENOUS | Status: DC | PRN

## 2022-03-24 MED ORDER — PROPOFOL 10 MG/ML IV BOLUS
INTRAVENOUS | Status: AC
  Filled 2022-03-24: qty 20

## 2022-03-24 MED ORDER — PANTOPRAZOLE SODIUM 40 MG PO TBEC
40.0000 mg | DELAYED_RELEASE_TABLET | Freq: Every day | ORAL | Status: DC
  Administered 2022-03-24 – 2022-03-30 (×7): 40 mg via ORAL
  Filled 2022-03-24 (×7): qty 1

## 2022-03-24 MED ORDER — PROPOFOL 10 MG/ML IV BOLUS
INTRAVENOUS | Status: DC | PRN
  Administered 2022-03-24: 10 mg via INTRAVENOUS

## 2022-03-24 NOTE — Progress Notes (Signed)
Physical Therapy Treatment Patient Details Name: Julia Hernandez MRN: [id number] DOB: [birth year] Today's Date: 03/24/2022   History of Present Illness Pt is an 86 y.o. female admitted for anemia and with complaints of weakness, chest pain rating to the back.  This makes her fourth ER visit in the last 4 weeks. Patient had a CTPA done on May the 16, 2023 at the [facility] -  She had T8/T9 and L2 compression fractures.  PMH significant for COPD, osteoporosis, coronary disease, CKD stage IIIa, and mitral valve regurgitation.    PT Comments    Noted Hgb 7.1 today. Pt ambulated 5' with RW this session, no loss of balance, but pt reported BLE weakness and felt her legs were "about to give out", so assisted pt to recliner, HR 115 with walking. She does not have 24/7 assistance available at home and is not functioning at a level that would allow her to DC independently to home. PT now recommending ST-SNF. Pt is agreeable to this plan.    Recommendations for follow up therapy are one component of a multi-disciplinary discharge planning process, led by the attending physician.  Recommendations may be updated based on patient status, additional functional criteria and insurance authorization.  Follow Up Recommendations  Skilled nursing-short term rehab (<3 hours/day)     Assistance Recommended at Discharge Intermittent Supervision/Assistance  Patient can return home with the following A little help with walking and/or transfers;Assist for transportation;Assistance with cooking/housework;A little help with bathing/dressing/bathroom   Equipment Recommendations  None recommended by PT    Recommendations for Other Services OT consult     Precautions / Restrictions Precautions Precautions: Fall Restrictions Weight Bearing Restrictions: No     Mobility  Bed Mobility Overal bed mobility: Modified Independent             General bed mobility comments: HOB elevated, min use of bed  rails    Transfers Overall transfer level: Needs assistance Equipment used: Rolling walker (2 wheels) Transfers: Sit to/from Stand Sit to Stand: Min guard   Step pivot transfers: Min guard       General transfer comment: supervision for safety, increased time to rise to full stand due to discomfort, VCs hand placement;    Ambulation/Gait Ambulation/Gait assistance: Min guard Gait Distance (Feet): 5 Feet Assistive device: Rolling walker (2 wheels) Gait Pattern/deviations: Step-through pattern, Decreased stride length Gait velocity: decr     General Gait Details: distance limited by pt feeling her legs were weak and "about to give out"; she denied dizziness in standing, HR 115 with short walk   Stairs             Wheelchair Mobility    Modified Rankin (Stroke Patients Only)       Balance Overall balance assessment: Needs assistance Sitting-balance support: Feet supported Sitting balance-Leahy Scale: Fair     Standing balance support: Bilateral upper extremity supported, During functional activity Standing balance-Leahy Scale: Fair                              Cognition Arousal/Alertness: Awake/alert Behavior During Therapy: WFL for tasks assessed/performed Overall Cognitive Status: Within Functional Limits for tasks assessed                                          Exercises      General Comments  Pertinent Vitals/Pain Pain Assessment Pain Score: 5  Pain Location: bilateral rib cage region and radiating around back (pt reports this is chronic) Pain Descriptors / Indicators: Aching Pain Intervention(s): Limited activity within patient's tolerance, Heat applied, Monitored during session, Premedicated before session    Home Living                          Prior Function            PT Goals (current goals can now be found in the care plan section) Acute Rehab PT Goals Patient Stated Goal: feel  better and get back home PT Goal Formulation: With patient Time For Goal Achievement: 04/03/22 Potential to Achieve Goals: Good Progress towards PT goals: Progressing toward goals    Frequency    Min 3X/week      PT Plan Discharge plan needs to be updated    Co-evaluation              AM-PAC PT "6 Clicks" Mobility   Outcome Measure  Help needed turning from your back to your side while in a flat bed without using bedrails?: None Help needed moving from lying on your back to sitting on the side of a flat bed without using bedrails?: A Little Help needed moving to and from a bed to a chair (including a wheelchair)?: A Little Help needed standing up from a chair using your arms (e.g., wheelchair or bedside chair)?: A Little Help needed to walk in hospital room?: A Lot Help needed climbing 3-5 steps with a railing? : A Lot 6 Click Score: 17    End of Session   Activity Tolerance: Patient tolerated treatment well Patient left: in chair;with chair alarm set;with call bell/phone within reach Nurse Communication: Mobility status PT Visit Diagnosis: Unsteadiness on feet (R26.81);Muscle weakness (generalized) (M62.81);Pain     Time: [id number] PT Time Calculation (min) (ACUTE ONLY): 11 min  Charges:  $Therapeutic Activity: 8-22 mins                     Blondell Reveal Kistler PT 03/24/2022  Acute Rehabilitation Services Pager [phone] Office [phone]

## 2022-03-24 NOTE — Anesthesia Postprocedure Evaluation (Signed)
Anesthesia Post Note  Patient: Julia Hernandez  Procedure(s) Performed: ESOPHAGOGASTRODUODENOSCOPY (EGD) BALLOON DILATION     Patient location during evaluation: PACU Anesthesia Type: MAC Level of consciousness: awake and alert Pain management: pain level controlled Vital Signs Assessment: post-procedure vital signs reviewed and stable Respiratory status: spontaneous breathing, nonlabored ventilation, respiratory function stable and patient connected to nasal cannula oxygen Cardiovascular status: stable and blood pressure returned to baseline Postop Assessment: no apparent nausea or vomiting Anesthetic complications: no   No notable events documented.  Last Vitals:  Vitals:   03/24/22 0709 03/24/22 0753  BP: (!) 167/64 (!) 170/72  Pulse: 92 91  Resp:  (!) 21  Temp: 36.6 C 36.9 C  SpO2: 94% 100%    Last Pain:  Vitals:   03/24/22 0753  TempSrc: Temporal  PainSc: 0-No pain                 Alexzandra Bilton S

## 2022-03-24 NOTE — Progress Notes (Signed)
[facility]  PROGRESS NOTE  Julia Hernandez [id number] DOB: Dec 15, 1935 DOA: 03/19/2022 PCP: Kathyrn Lass, MD   Brief HPI:    86 year old white female history of COPD, osteoporosis, CAD, CKD stage III, history of mitral valve regurgitation came to ER with complaints of generalized weakness, chest pain radiating to back.  She had a CTA done twice in the past 1 month which was negative for PE.  In the ED she was found to be hypotensive also found to have hemoglobin of 7.7. Patient admitted with anemia GI was consulted, recommended to stop Brilinta.  Cardiology was consulted and Brilinta was stopped. Patient underwent EGD which was essentially normal except for esophageal stenosis which was dilated.  GI recommends to restart Brilinta tomorrow at prior dose.  Subjective   Patient seen and examined, underwent EGD this morning.  Denies pain   Assessment/Plan:     Iron deficiency anemia -Patient states that she has history of intermittent black stools, has been on Brilinta for PCI in 2020 -Hemoglobin has dropped down to 7.1.  It was 7.7 yesterday.  Hemoglobin in February 2020 was 11.0 -Gastroenterology was consulted; GI recommends to stop Brilinta -Brilinta has been stopped after discussion with cardiology She has been on Brilinta and aspirin for past 3 years -Transfuse for hemoglobin less than 7 -B12 level 2500 -Patient's hemoglobin is 7.1 this morning -Follow CBC in a.m.   Melena -Patient gives intermittent history of black stool in the setting of taking Brilinta for past 3 years -Hemoglobin dropping in the hospital -Gastroenterology was consulted -EGD was essentially normal, except for esophageal stenosis which was dilated. -No plan for colonoscopy -We will restart Brilinta from tomorrow as per GI recommendation  CAD/hypertension -S/p angioplasty and graft in 2020 -Discussed with Dr. Virgina Jock; will discontinue Brilinta as patient has been on Brilinta for past 3 years  after stent placement in 2020 -She can be started on baby aspirin once stable as per cardiology -However EGD was essentially normal, GI recommends to restart Brilinta -Called and discussed with patient's daughter, patient may still have source of bleeding in small intestine or colon.  Putting her back on Brilinta is a high risk for bleeding.  Cardiology has recommended to stop Brilinta.  Daughter agrees, will start her on aspirin 81 mg daily from tomorrow morning.He -Echocardiogram showed EF 55% with grade 1 diastolic dysfunction -Continue metoprolol  Wedge compression fracture of unspecified lumbar vertebra -Continue pain management  COPD -Chronic -Not in exacerbation -Continue mometasone formoterol" twice daily  Acute kidney injury on CKD stage III a -HCTZ and Cozaar on hold -Serum creatinine is down to 0.88 today -At baseline  Hypokalemia -Replete     Medications     atorvastatin  40 mg Oral Daily   feeding supplement  1 Container Oral TID BM   feeding supplement  237 mL Oral BID BM   heparin injection (subcutaneous)  5,000 Units Subcutaneous Q8H   metoprolol succinate  12.5 mg Oral Daily   mometasone-formoterol  2 puff Inhalation BID   multivitamin with minerals  1 tablet Oral Daily   pantoprazole  40 mg Oral Daily   sertraline  100 mg Oral QHS     Data Reviewed:   CBG:  No results for input(s): "GLUCAP" in the last 168 hours.  SpO2: 97 % O2 Flow Rate (L/min): 10 L/min    Vitals:   03/24/22 0918 03/24/22 1025 03/24/22 1120 03/24/22 1313  BP: (!) 157/55 (!) 144/53 (!) 144/57 (!) 156/57  Pulse: 90 88  86 86  Resp: 16 16 16 16   Temp: 98 F (36.7 C) 98 F (36.7 C) 98.5 F (36.9 C) 98 F (36.7 C)  TempSrc: Oral Oral Oral Oral  SpO2: 96% 95% 96% 97%  Weight:      Height:          Data Reviewed:  Basic Metabolic Panel: Recent Labs  Lab 03/19/22 1749 03/20/22 1124 03/21/22 0533 03/22/22 0508 03/24/22 0421  NA 142 141 141 139 141  K 3.2* 2.9*  3.2* 3.5 3.8  CL 93* 96* 99 99 104  CO2 38* 34* 34* 32 30  GLUCOSE 106* 104* 100* 89 88  BUN 35* 29* 19 12 17   CREATININE 1.36* 1.20* 0.88 0.81 0.99  CALCIUM 9.8 9.3 9.0 8.6* 8.9    CBC: Recent Labs  Lab 03/19/22 1749 03/20/22 0446 03/21/22 0533 03/22/22 0508 03/23/22 0417 03/23/22 0825 03/24/22 0421  WBC 7.8 6.8 7.7 5.5 4.4  --  5.9  NEUTROABS 6.2 5.2  --   --   --   --   --   HGB 7.7* 7.6* 7.5* 7.1* 6.9* 7.7* 7.1*  HCT 26.5* 27.6* 26.5* 24.8* 23.8* 26.5* 25.1*  MCV 79.1* 85.7 81.5 80.8 80.1  --  80.4  PLT 214 187 195 173 165  --  167    LFT Recent Labs  Lab 03/19/22 1749 03/20/22 1124 03/21/22 0533 03/24/22 0421  AST 18 23 24 21   ALT 11 14 15 13   ALKPHOS 83 87 85 73  BILITOT 0.5 0.7 0.7 0.6  PROT 7.3 7.0 6.9 6.2*  ALBUMIN 3.8 3.3* 3.2* 3.0*     Antibiotics: Anti-infectives (From admission, onward)    None        DVT prophylaxis: Heparin started on 03/23/2022  Code Status: Full code  Family Communication: No family at bedside   CONSULTS    Objective    Physical Examination:  General-appears in no acute distress Heart-S1-S2, regular, no murmur auscultated Lungs-clear to auscultation bilaterally, no wheezing or crackles auscultated Abdomen-soft, mild distention, nontender, no organomegaly Extremities-no edema in the lower extremities Neuro-alert, oriented x3, no focal deficit noted  Status is: Inpatient: Iron-deficiency anemia           [place]   [facility] If 7PM-7AM, please contact night-coverage at [email or web address], Office  [phone]   03/24/2022, 4:37 PM  LOS: 2 days

## 2022-03-24 NOTE — Addendum Note (Signed)
Addendum  created 03/24/22 1015 by Sindy Guadeloupe, CRNA   Flowsheet accepted

## 2022-03-24 NOTE — Op Note (Signed)
[facility] Patient Name: Julia Hernandez Procedure Date: 03/24/2022 MRN: [id number] Attending MD: Ronnette Juniper , MD Date of Birth: 1936-01-18 CSN: [id number] Age: 86 Admit Type: Inpatient Procedure:                Upper GI endoscopy Indications:              Unexplained iron deficiency anemia, Dysphagia Providers:                Ronnette Juniper, MD, William Dalton, Technician, Darliss Cheney, Technician, Ladoris Gene, RN Referring MD:             [facility] Medicines:                Monitored Anesthesia Care Complications:            No immediate complications. Estimated blood loss:                            Minimal. Estimated Blood Loss:     Estimated blood loss was minimal. Procedure:                Pre-Anesthesia Assessment:                           - Prior to the procedure, a History and Physical                            was performed, and patient medications and                            allergies were reviewed. The patient's tolerance of                            previous anesthesia was also reviewed. The risks                            and benefits of the procedure and the sedation                            options and risks were discussed with the patient.                            All questions were answered, and informed consent                            was obtained. Prior Anticoagulants: The patient has                            taken Brilinta, last dose was 3 days prior to                            procedure. ASA Grade Assessment: III - A patient  with severe systemic disease. After reviewing the                            risks and benefits, the patient was deemed in                            satisfactory condition to undergo the procedure.                           After obtaining informed consent, the endoscope was                            passed under direct vision. Throughout the                             procedure, the patient's blood pressure, pulse, and                            oxygen saturations were monitored continuously. The                            [id number] Olympus endoscope was introduced                            through the mouth, and advanced to the second part                            of duodenum. The upper GI endoscopy was                            accomplished without difficulty. The patient                            tolerated the procedure well. Scope In: Scope Out: Findings:      Food was found in the entire esophagus. There was small amount of food       residue noted in her mouth and the entire esophagus, likely related to       esophageal dysmotility.      One benign-appearing, intrinsic severe (stenosis; an endoscope cannot       pass) stenosis was found 35 cm from the incisors. The stenosis was       traversed after dilation. A TTS dilator was passed through the scope.       Dilation with a 12-13.5-15 mm balloon dilator was performed to 12 mm.       The dilation site was examined following endoscope reinsertion and       showed moderate mucosal disruption and complete resolution of luminal       narrowing.      The entire examined stomach was normal.      The cardia and gastric fundus were normal on retroflexion.      The examined duodenum was normal. Impression:               - Food in the esophagus.                           -  Benign-appearing esophageal stenosis. Dilated.                           - Normal stomach.                           - Normal examined duodenum.                           - No specimens collected. Moderate Sedation:      Patient did not receive moderate sedation for this procedure, but       instead received monitored anesthesia care. Recommendation:           - Mechanical soft diet.                           - Continue present medications.                           - Resume brilinta at prior dose  tomorrow. Procedure Code(s):        --- Professional ---                           [phone], Esophagogastroduodenoscopy, flexible,                            transoral; with transendoscopic balloon dilation of                            esophagus (less than 30 mm diameter) Diagnosis Code(s):        --- Professional ---                           [id number], Food in esophagus causing other injury,                            initial encounter                           K22.2, Esophageal obstruction                           D50.9, Iron deficiency anemia, unspecified                           R13.10, Dysphagia, unspecified CPT copyright 2019 American Medical Association. All rights reserved. The codes documented in this report are preliminary and upon coder review may  be revised to meet current compliance requirements. Ronnette Juniper, MD 03/24/2022 7:55:32 AM This report has been signed electronically. Number of Addenda: 0

## 2022-03-24 NOTE — Anesthesia Procedure Notes (Signed)
Procedure Name: MAC Date/Time: 03/24/2022 7:37 AM  Performed by: Eben Burow, CRNAPre-anesthesia Checklist: Patient identified, Emergency Drugs available, Suction available, Patient being monitored and Timeout performed Oxygen Delivery Method: Simple face mask Placement Confirmation: positive ETCO2

## 2022-03-24 NOTE — Interval H&P Note (Signed)
History and Physical Interval Note: 85/female with anemia, intermittent dysphagia, was on brilinta, last dose likely on 03/21/22,for an EGD with possible balloon dilation.  03/24/2022 7:31 AM  Julia Hernandez  has presented today for EGD with possible balloon dilation with the diagnosis of Anemia, chronic anticoagulation.  The various methods of treatment have been discussed with the patient and family. After consideration of risks, benefits and other options for treatment, the patient has consented to  Procedure(s): ESOPHAGOGASTRODUODENOSCOPY (EGD) (N/A) as a surgical intervention.  The patient's history has been reviewed, patient examined, no change in status, stable for surgery.  I have reviewed the patient's chart and labs.  Questions were answered to the patient's satisfaction.     Ronnette Juniper

## 2022-03-24 NOTE — Progress Notes (Signed)
Endo nurse gave report to primary nurse Darl Pikes, RN. Patient had a change in her diet please see  post procedure report in chart.

## 2022-03-24 NOTE — TOC Progression Note (Signed)
Transition of Care [facility]) - Progression Note   Patient Details  Name: Tylynn Braniff MRN: [id number] Date of Birth: Jul 06, 1936  Transition of Care [facility]) CM/SW [place], LCSW Phone Number: 03/24/2022, 2:30 PM  Clinical Narrative: PT evaluation recommended HHPT. CSW met with patient in the room to discuss recommendations. Per patient, she is agreeable to going home with [facility] and wants [facility], but her daughter would prefer if she went to "rehab for a few days." CSW explained that SNF had not been recommended and, per chart review, patient declined to work with OT several times. CSW explained patient can be set up with [facility] unless PT's recommendations change, but would wait until PT sees the patient again.   Expected Discharge Plan: [place] Barriers to Discharge: Continued Medical Work up  Expected Discharge Plan and Services Expected Discharge Plan: [place] In-house Referral: Clinical Social Work Discharge Planning Services: CM Consult Post Acute Care Choice: [place] arrangements for the past 2 months: Apartment           DME Arranged: N/A DME Agency: NA  Readmission Risk Interventions     No data to display

## 2022-03-24 NOTE — Transfer of Care (Signed)
Immediate Anesthesia Transfer of Care Note  Patient: Julia Hernandez  Procedure(s) Performed: ESOPHAGOGASTRODUODENOSCOPY (EGD) BALLOON DILATION  Patient Location: PACU and Endoscopy Unit  Anesthesia Type:MAC  Level of Consciousness: awake, alert  and patient cooperative  Airway & Oxygen Therapy: Patient Spontanous Breathing and Patient connected to face mask oxygen  Post-op Assessment: Report given to RN and Post -op Vital signs reviewed and stable  Post vital signs: Reviewed and stable  Last Vitals:  Vitals Value Taken Time  BP    Temp    Pulse    Resp    SpO2      Last Pain:  Vitals:   03/24/22 0709  TempSrc: Tympanic  PainSc: 0-No pain      Patients Stated Pain Goal: 2 (02/27/32 5825)  Complications: No notable events documented.

## 2022-03-24 NOTE — Anesthesia Preprocedure Evaluation (Signed)
Anesthesia Evaluation  Patient identified by MRN, date of birth, ID band Patient awake    Reviewed: Allergy & Precautions, NPO status , Patient's Chart, lab work & pertinent test results  Airway Mallampati: II  TM Distance: >3 FB Neck ROM: Full    Dental no notable dental hx.    Pulmonary asthma , Current Smoker,    Pulmonary exam normal breath sounds clear to auscultation       Cardiovascular hypertension, Pt. on medications + Valvular Problems/Murmurs MR  Rhythm:Regular Rate:Normal + Systolic murmurs . Left ventricular ejection fraction, by estimation, is 55%. The left  ventricle has normal function. The left ventricle has no regional wall  motion abnormalities. Left ventricular diastolic parameters are consistent  with Grade I diastolic dysfunction  (impaired relaxation).  2. Right ventricular systolic function is normal. The right ventricular  size is normal. There is normal pulmonary artery systolic pressure. The  estimated right ventricular systolic pressure is 76.7 mmHg.  3. The mitral valve is normal in structure. Trivial mitral valve  regurgitation. No evidence of mitral stenosis.  4. The aortic valve is tricuspid. There is mild calcification of the  aortic valve. Aortic valve regurgitation is not visualized. No aortic  stenosis is present.  5. The inferior vena cava is normal in size with greater than 50%  respiratory variability, suggesting right atrial pressure of 3 mmHg.   Neuro/Psych negative neurological ROS  negative psych ROS   GI/Hepatic negative GI ROS, Neg liver ROS,   Endo/Other  negative endocrine ROS  Renal/GU negative Renal ROS  negative genitourinary   Musculoskeletal negative musculoskeletal ROS (+)   Abdominal   Peds negative pediatric ROS (+)  Hematology  (+) Blood dyscrasia, anemia ,   Anesthesia Other Findings   Reproductive/Obstetrics negative OB ROS                              Anesthesia Physical Anesthesia Plan  ASA: 3  Anesthesia Plan: MAC   Post-op Pain Management: Minimal or no pain anticipated   Induction: Intravenous  PONV Risk Score and Plan: 2 and Propofol infusion and Treatment may vary due to age or medical condition  Airway Management Planned: Simple Face Mask  Additional Equipment:   Intra-op Plan:   Post-operative Plan:   Informed Consent: I have reviewed the patients History and Physical, chart, labs and discussed the procedure including the risks, benefits and alternatives for the proposed anesthesia with the patient or authorized representative who has indicated his/her understanding and acceptance.     Dental advisory given  Plan Discussed with: CRNA and Surgeon  Anesthesia Plan Comments:         Anesthesia Quick Evaluation

## 2022-03-25 ENCOUNTER — Inpatient Hospital Stay (HOSPITAL_COMMUNITY): Payer: Medicare HMO

## 2022-03-25 DIAGNOSIS — D509 Iron deficiency anemia, unspecified: Secondary | ICD-10-CM | POA: Diagnosis not present

## 2022-03-25 LAB — CBC
HCT: 25 % — ABNORMAL LOW (ref 36.0–46.0)
Hemoglobin: 7 g/dL — ABNORMAL LOW (ref 12.0–15.0)
MCH: 22.4 pg — ABNORMAL LOW (ref 26.0–34.0)
MCHC: 28 g/dL — ABNORMAL LOW (ref 30.0–36.0)
MCV: 80.1 fL (ref 80.0–100.0)
Platelets: 193 10*3/uL (ref 150–400)
RBC: 3.12 MIL/uL — ABNORMAL LOW (ref 3.87–5.11)
RDW: 15.1 % (ref 11.5–15.5)
WBC: 6.3 10*3/uL (ref 4.0–10.5)
nRBC: 0 % (ref 0.0–0.2)

## 2022-03-25 LAB — BPAM RBC
Blood Product Expiration Date: 202307062359
Unit Type and Rh: 5100

## 2022-03-25 LAB — TYPE AND SCREEN
ABO/RH(D): O POS
Antibody Screen: NEGATIVE
Unit division: 0

## 2022-03-25 MED ORDER — CYANOCOBALAMIN 1000 MCG/ML IJ SOLN
1000.0000 ug | Freq: Once | INTRAMUSCULAR | Status: DC
  Filled 2022-03-25: qty 1

## 2022-03-25 MED ORDER — FERROUS SULFATE 325 (65 FE) MG PO TABS
325.0000 mg | ORAL_TABLET | Freq: Every day | ORAL | Status: DC
  Administered 2022-03-26 – 2022-03-30 (×5): 325 mg via ORAL
  Filled 2022-03-25 (×5): qty 1

## 2022-03-25 MED ORDER — ENSURE ENLIVE PO LIQD
237.0000 mL | Freq: Three times a day (TID) | ORAL | Status: DC
  Administered 2022-03-26 – 2022-03-29 (×6): 237 mL via ORAL

## 2022-03-25 MED ORDER — SODIUM CHLORIDE 0.9 % IV SOLN
250.0000 mg | Freq: Once | INTRAVENOUS | Status: AC
  Administered 2022-03-25: 250 mg via INTRAVENOUS
  Filled 2022-03-25: qty 20

## 2022-03-25 MED ORDER — B COMPLEX-C PO TABS
1.0000 | ORAL_TABLET | Freq: Every day | ORAL | Status: DC
  Administered 2022-03-26 – 2022-03-29 (×4): 1 via ORAL
  Filled 2022-03-25 (×5): qty 1

## 2022-03-25 NOTE — Progress Notes (Signed)
Progress Note Patient: Margia Schmidt [id number] DOB: 1936/05/23 DOA: 03/19/2022  DOS: the patient was seen and examined on 03/25/2022  Brief hospital course: Past medical history of CAD, CKD stage IIIa, HTN, COPD, compression fracture presents to the hospital with complaints of chronic chest pain, generalized weakness as well as back pain. Found to have iron deficiency anemia and melena for which GI was consulted and underwent EGD which was negative. Cardiology was consulted and patient was taken off of the [place]. Patient continues to report chest pain and back pain as well as constipation.  Assessment and Plan: Acute on chronic back pain as well as chest pain. Multiple compression fractures Primary reason for patient's presentation. This pain has been ongoing for almost 4 years but worsened for last 1 month. Patient had 4 ER visit with this complaint. CT angio chest negative for PE.  CT abdomen pelvis negative for any acute intra-abdominal pathology. Patient appears to have age-indeterminate severe compression fracture at T8 and T9, chronic moderate compression fracture at L2.  Also minor compression fracture seen on T11-L1. Patient continues to report severe pain but controlled with current regimen. We will continue oral oxycodone. PT OT consulted as well.  Recommends SNF. We will consult IR to see if the patient will be a candidate for kyphoplasty.  Chronic chest pain. Work-up including troponins, EKG, CT PE protocol has been negative. Continue pain control.  Prior history of CAD Follows up with [facility] cardiology. SP angioplasty and graft in 2020. Patient was on Brilinta, based on conversation between cardiology and prior provider is now discontinued due to increased risk for bleeding. Continue baby aspirin. Echocardiogram showed EF of [id number] with diastolic dysfunction.  Iron deficiency anemia. Melena. Patient had history of intermittent black stool. Patient was on Brilinta  prior to admission. Baseline hemoglobin was around 11. Currently hemoglobin is around 7. No bleeding seen here in the hospital. FOBT was positive. B12 was adequate. Iron level was severely low. Underwent EGD which did not show any evidence of active bleeding. Continuing daily PPI. We will provide IV iron. Continue multivitamins. Monitor CBC. Transfuse for hemoglobin less than 7 or hemodynamic instability.  COPD. Appears to be stable without any exacerbation. Continuing inhalers. Suspect this is causing patient's chest tightness feeling.  Acute kidney injury on chronic kidney disease stage IIIa. Baseline appears to be around 0.8. On presentation serum creatinine was 1.36. Patient is on HCTZ and Cozaar both of which are on hold. Serum creatinine improving for now.  Hypokalemia. Replaced.  Nausea. Constipation. X-ray ordered, patient is unable to tolerate the x-ray. We will continue bowel regimen for now.  Anxiety./Mood disorder. Patient is on Zoloft which I will continue.  Moderate protein calorie malnutrition. Body mass index is 21.61 kg/m. Nutrition Problem: Moderate Malnutrition Etiology: chronic illness, dysphagia (abdominal pain) Nutrition Interventions: Interventions: Ensure Enlive (each supplement provides 350kcal and 20 grams of protein), Boost Breeze, MVI   Subjective: Reports nausea as well as chest tightness.  Reports back pain.  No fever no chills.  Passing gas but no BM.  Minimal oral intake.  Physical Exam: Vitals:   03/25/22 0507 03/25/22 0835 03/25/22 0839 03/25/22 1423  BP: (!) 144/56   (!) 155/61  Pulse: 86   90  Resp: 18   16  Temp: 98.3 F (36.8 C)   98.2 F (36.8 C)  TempSrc: Oral   Oral  SpO2: 95% 99% 99% 96%  Weight:      Height:       General: Appear in  moderate distress; no visible Abnormal Neck Mass Or lumps, Conjunctiva normal Cardiovascular: S1 and S2 Present, aortic systolic Murmur, Respiratory: good respiratory effort,  Bilateral Air entry present and CTA, no Crackles, Occasional  wheezes Abdomen: Bowel Sound present, Non tender Extremities: no Pedal edema Neurology: alert and oriented to time, place, and person  Gait not checked due to patient safety concerns   Data Reviewed: I have Reviewed nursing notes, Vitals, and Lab results since pt's last encounter. Pertinent lab results CBC and BMP I have ordered test including x-ray chest, x-ray abdomen, EKG, CBC, BMP    Family Communication: Daughter at bedside  Disposition: Status is: Inpatient Remains inpatient appropriate because: Ongoing pain control issues as well as poor p.o. intake requiring further work-up. Author: Berle Mull, MD 03/25/2022 6:56 PM  Please look on [email or web address] to find out who is on call.

## 2022-03-25 NOTE — Evaluation (Signed)
Occupational Therapy Evaluation Patient Details Name: Julia Hernandez MRN: [id number] DOB: 14-Sep-1936 Today's Date: 03/25/2022   History of Present Illness Pt is an 86 y.o. female admitted for anemia and with complaints of weakness, chest pain rating to the back.  This makes her fourth ER visit in the last 4 weeks. Patient had a CTPA done on May the 16, 2023 at the [facility] -  She had T8/T9 and L2 compression fractures.  PMH significant for COPD, osteoporosis, coronary disease, CKD stage IIIa, and mitral valve regurgitation.   Clinical Impression   Mrs. Julia Hernandez is an 86 year old woman normally independent at home alone who now presents with generalized weakness ,decreased activity tolerance and pain. On evaluation she is overall min guard for mobility with RW but exhibits poor tolerance - only able to ambulate around the bed due to pain around waist. She is over all min guard to setup for ADLs - demonstrating ability to don socks at edge of bed, feed herself and stand without physical assistance. Patient will benefit from skilled OT services while in hospital to improve deficits and learn compensatory strategies as needed in order to return to PLOF.        Recommendations for follow up therapy are one component of a multi-disciplinary discharge planning process, led by the attending physician.  Recommendations may be updated based on patient status, additional functional criteria and insurance authorization.   Follow Up Recommendations  Skilled nursing-short term rehab (<3 hours/day)    Assistance Recommended at Discharge Frequent or constant Supervision/Assistance  Patient can return home with the following A little help with walking and/or transfers;A little help with bathing/dressing/bathroom;Assistance with cooking/housework;Help with stairs or ramp for entrance    Functional Status Assessment  Patient has had a recent decline in their functional status and demonstrates  the ability to make significant improvements in function in a reasonable and predictable amount of time.  Equipment Recommendations  None recommended by OT    Recommendations for Other Services       Precautions / Restrictions Precautions Precautions: Fall Restrictions Weight Bearing Restrictions: No      Mobility Bed Mobility Overal bed mobility: Modified Independent             General bed mobility comments: HOB elevated, min use of bed rails    Transfers Overall transfer level: Needs assistance Equipment used: Rolling walker (2 wheels) Transfers: Sit to/from Stand Sit to Stand: Min guard     Step pivot transfers: Min guard     General transfer comment: min guard to stand and ambulate around bed. Poor activity tolerance due to pain - unable to make it to bathroom and returned to side of bed.      Balance Overall balance assessment: Needs assistance, Mild deficits observed, not formally tested Sitting-balance support: Feet supported, No upper extremity supported Sitting balance-Leahy Scale: Good     Standing balance support: Bilateral upper extremity supported, During functional activity Standing balance-Leahy Scale: Fair                             ADL either performed or assessed with clinical judgement   ADL Overall ADL's : Needs assistance/impaired Eating/Feeding: Independent   Grooming: Set up;Sitting   Upper Body Bathing: Set up;Sitting   Lower Body Bathing: Min guard;Sit to/from stand   Upper Body Dressing : Set up;Sitting   Lower Body Dressing: Min guard;Sit to/from stand   Toilet Transfer:  Min guard;BSC/3in1   Toileting- Water quality scientist and Hygiene: Min guard;Sit to/from stand       Functional mobility during ADLs: Min guard;Rolling walker (2 wheels)       Vision Patient Visual Report: No change from baseline       Perception     Praxis      Pertinent Vitals/Pain Pain Assessment Pain Assessment: 0-10 Pain  Score: 8  Pain Location: bilateral rib cage region and radiating around back (pt reports this is chronic) Pain Descriptors / Indicators: Aching, Grimacing Pain Intervention(s): Limited activity within patient's tolerance, Patient requesting pain meds-RN notified     Hand Dominance     Extremity/Trunk Assessment Upper Extremity Assessment Upper Extremity Assessment: Overall WFL for tasks assessed   Lower Extremity Assessment Lower Extremity Assessment: Defer to PT evaluation   Cervical / Trunk Assessment Cervical / Trunk Assessment: Kyphotic   Communication Communication Communication: No difficulties   Cognition Arousal/Alertness: Awake/alert Behavior During Therapy: WFL for tasks assessed/performed Overall Cognitive Status: Within Functional Limits for tasks assessed                                       General Comments       Exercises     Shoulder Instructions      Home Living Family/patient expects to be discharged to:: Skilled nursing facility Living Arrangements: Alone Available Help at Discharge: Family;Available PRN/intermittently Type of Home: House Home Access: Level entry     Home Layout: One level     Bathroom Shower/Tub: Tub/shower unit         Home Equipment: Conservation officer, nature (2 wheels);Rollator (4 wheels);Shower seat;Grab bars - tub/shower   Additional Comments: daughter comes by every day, sometimes multiple times/day when able. Pt was doing HHPT, only had 1 visit before coming to ED. Denis history of falls.      Prior Functioning/Environment Prior Level of Function : Independent/Modified Independent;Driving             Mobility Comments: uses RW in home lately. no AD prior. ADLs Comments: daughter has been bringing by all meals recenlty since not feeling well.        OT Problem List: Decreased activity tolerance;Impaired balance (sitting and/or standing);Decreased knowledge of use of DME or AE;Pain      OT  Treatment/Interventions: Self-care/ADL training;Therapeutic exercise;DME and/or AE instruction;Therapeutic activities;Balance training;Patient/family education    OT Goals(Current goals can be found in the care plan section) Acute Rehab OT Goals Patient Stated Goal: return to baseline - independent OT Goal Formulation: With patient Time For Goal Achievement: 04/08/22 Potential to Achieve Goals: Good  OT Frequency: Min 2X/week    Co-evaluation              AM-PAC OT "6 Clicks" Daily Activity     Outcome Measure Help from another person eating meals?: None Help from another person taking care of personal grooming?: A Little Help from another person toileting, which includes using toliet, bedpan, or urinal?: A Little Help from another person bathing (including washing, rinsing, drying)?: A Little Help from another person to put on and taking off regular upper body clothing?: A Little Help from another person to put on and taking off regular lower body clothing?: A Little 6 Click Score: 19   End of Session Equipment Utilized During Treatment: Rolling walker (2 wheels);Gait belt Nurse Communication: Mobility status;Patient requests pain meds  Activity Tolerance: Patient  limited by pain Patient left: in bed;with call bell/phone within reach;with bed alarm set;with family/visitor present  OT Visit Diagnosis: Pain                Time: [id number] OT Time Calculation (min): 17 min Charges:  OT General Charges $OT Visit: 1 Visit OT Evaluation $OT Eval Low Complexity: 1 Low  Milinda Sweeney, OTR/L [place]  Office [phone] Pager: [place] 03/25/2022, 3:51 PM

## 2022-03-25 NOTE — NC FL2 (Signed)
[place] LEVEL OF CARE SCREENING TOOL     IDENTIFICATION  Patient Name: Julia Hernandez Birthdate: 10-Jun-1936 Sex: female Admission Date (Current Location): 03/19/2022  [facility] and [place] Number:  Herbalist and Address:  [facility],  [place], [place]      Provider Number: [id number]  Attending Physician Name and Address:  Lavina Hamman, MD  Relative Name and Phone Number:  Gaylene Brooks (daughter) Ph: [phone]    Current Level of Care: Hospital Recommended Level of Care: [place] Prior Approval Number:    Date Approved/Denied:   PASRR Number: [id number]  Discharge Plan: SNF    Current Diagnoses: Patient Active Problem List   Diagnosis Date Noted   Malnutrition of moderate degree 03/24/2022   DNR (do not resuscitate)/DNI(Do Not Intubate) 03/20/2022   AKI (acute kidney injury) ([place]) 03/20/2022   Anemia 03/19/2022   Tobacco abuse 03/19/2022   Stage 3a chronic kidney disease (CKD) ([place]) 08/20/2021   Presence of coronary angioplasty implant and graft 08/20/2021   Wedge compression fracture of unspecified lumbar vertebra, initial encounter for closed fracture ([place]) 08/20/2021   Nonrheumatic mitral valve regurgitation 11/11/2018   Chronic obstructive pulmonary disease, unspecified ([place]) 11/10/2018    Orientation RESPIRATION BLADDER Height & Weight     Self, Time, Situation, Place  Normal Incontinent Weight: 99 lb 13.9 oz (45.3 kg) Height:  4\' 9"  (144.8 cm)  BEHAVIORAL SYMPTOMS/MOOD NEUROLOGICAL BOWEL NUTRITION STATUS   (N/A)  (N/A) Continent Diet (Dysphagia 3 diet)  AMBULATORY STATUS COMMUNICATION OF NEEDS Skin   Limited Assist Verbally Other (Comment) (Ecchymosis: bilateral arms, hands)                       Personal Care Assistance Level of Assistance  Bathing, Feeding, Dressing Bathing Assistance: Limited assistance Feeding assistance: Independent Dressing Assistance: Limited  assistance     Functional Limitations Info  Sight, Hearing, Speech Sight Info: Impaired Hearing Info: Adequate Speech Info: Adequate    SPECIAL CARE FACTORS FREQUENCY  PT (By licensed PT), OT (By licensed OT)     PT Frequency: 5x's/week OT Frequency: 5x's/week            Contractures Contractures Info: Not present    Additional Factors Info  Code Status, Allergies, Psychotropic Code Status Info: DNR Allergies Info: Influenza Vac Split Quad, Ditropan Xl (Oxybutynin), Simvastatin Psychotropic Info: Zoloft         Current Medications (03/25/2022):  This is the current hospital active medication list Current Facility-Administered Medications  Medication Dose Route Frequency Provider Last Rate Last Admin   acetaminophen (TYLENOL) tablet 650 mg  650 mg Oral Q6H PRN Ronnette Juniper, MD   650 mg at 03/23/22 [id number]   Or   acetaminophen (TYLENOL) suppository 650 mg  650 mg Rectal Q6H PRN Ronnette Juniper, MD       albuterol (PROVENTIL) (2.5 MG/3ML) 0.083% nebulizer solution 2.5 mg  2.5 mg Nebulization Q2H PRN Ronnette Juniper, MD       aspirin EC tablet 81 mg  81 mg Oral Daily Oswald Hillock, MD   81 mg at 03/25/22 1003   atorvastatin (LIPITOR) tablet 40 mg  40 mg Oral Daily Ronnette Juniper, MD   40 mg at 03/25/22 1003   [START ON 03/26/2022] B-complex with vitamin C tablet 1 tablet  1 tablet Oral Daily Lavina Hamman, MD       feeding supplement (ENSURE ENLIVE / ENSURE PLUS) liquid 237 mL  237 mL Oral TID BM Lavina Hamman, MD       [START ON 03/26/2022] ferrous sulfate tablet 325 mg  325 mg Oral Q breakfast Lavina Hamman, MD       melatonin tablet 10 mg  10 mg Oral QHS PRN Ronnette Juniper, MD   10 mg at 03/23/22 2034   metoprolol succinate (TOPROL-XL) 24 hr tablet 12.5 mg  12.5 mg Oral Daily Ronnette Juniper, MD   12.5 mg at 03/25/22 1003   mometasone-formoterol (DULERA) 200-5 MCG/ACT inhaler 2 puff  2 puff Inhalation BID Ronnette Juniper, MD   2 puff at 03/25/22 0834   multivitamin with minerals tablet 1 tablet   1 tablet Oral Daily Ronnette Juniper, MD   1 tablet at 03/23/22 1651   ondansetron (ZOFRAN) tablet 4 mg  4 mg Oral Q6H PRN Ronnette Juniper, MD       Or   ondansetron [facility]) injection 4 mg  4 mg Intravenous Q6H PRN Ronnette Juniper, MD       oxyCODONE (Oxy IR/ROXICODONE) immediate release tablet 2.5 mg  2.5 mg Oral Q4H PRN Ronnette Juniper, MD   2.5 mg at 03/25/22 1015   pantoprazole (PROTONIX) EC tablet 40 mg  40 mg Oral Daily Ronnette Juniper, MD   40 mg at 03/25/22 1003   phenol (CHLORASEPTIC) mouth spray 1 spray  1 spray Mouth/Throat PRN Oswald Hillock, MD   1 spray at 03/24/22 0907   sertraline (ZOLOFT) tablet 100 mg  100 mg Oral QHS Ronnette Juniper, MD   100 mg at 03/24/22 2008     Discharge Medications: Please see discharge summary for a list of discharge medications.  Relevant Imaging Results:  Relevant Lab Results:   Additional Information SSN: [id number]  Sherie Don, LCSW

## 2022-03-25 NOTE — Progress Notes (Signed)
Pt has poor effort at doing her inhaler.

## 2022-03-25 NOTE — Care Management Important Message (Signed)
Important Message  Patient Details IM Letter given to the Patient. Name: Julia Hernandez MRN: [id number] Date of Birth: 1935-10-15   Medicare Important Message Given:  Yes     Kerin Salen 03/25/2022, 11:58 AM

## 2022-03-25 NOTE — Hospital Course (Signed)
Past medical history of CAD, CKD stage IIIa, HTN, COPD, compression fracture presents to the hospital with complaints of chronic chest pain, generalized weakness as well as back pain. Found to have iron deficiency anemia and melena for which GI was consulted and underwent EGD which was negative. Cardiology was consulted and patient was taken off of the [place]. Patient continues to report chest pain and back pain as well as constipation.

## 2022-03-25 NOTE — TOC Progression Note (Signed)
Transition of Care [facility]) - Progression Note   Patient Details  Name: Julia Hernandez MRN: [id number] Date of Birth: 07-07-1936  Transition of Care [facility]) CM/SW [place], LCSW Phone Number: 03/25/2022, 1:43 PM  Clinical Narrative: PT evaluation is now recommending SNF. CSW spoke with patient's daughter, Gaylene Brooks, regarding recommendations. CSW explained that bed offers will be limited due to patient's insurance being OON with most SNFs and since patient decline to work with OT several times it may affect insurance approval. Daughter stated that OT "was coming at the wrong times," but per chart review, OT came back when patient requested that OT return and then continued to decline.  Daughter asked about options if SNF is denied. CSW explained a peer-to-peer and, lastly, an expedited appeal that the patient will have to do. Daughter reported the patient "won't do that" if an expedited appeal is needed. Daughter reported she is POA and CSW explained that since patient has capacity and is oriented x4, POA would not be relevant and TOC cannot compel patient to do an appeal.  Daughter asked if patient would have 24/7 nursing care if she went home with home health. CSW explained that would be private pay as Medicare will cover the [facility] visits, but not 24/7 care. Daughter asked, "what about Medicaid?" CSW explained the patient would have to meet criteria and then approval would take 30-60 days.  Daughter asked about pursuing ALF. CSW explained this option is also private pay. Daughter stated, "you're not being helpful right now." CSW reiterated that Medicare is not a catch-all for patient and family needs and family will need to consider other options if SNF is not approved and patient does not want to appeal.  FL2 done; PASRR verified. Initial referral faxed out. TOC awaiting bed offers.  Expected Discharge Plan: Skilled Nursing Facility Barriers to Discharge: Continued Medical Work up, SNF  Pending bed offer, Ship broker  Expected Discharge Plan and Services Expected Discharge Plan: [place] In-house Referral: Clinical Social Work Discharge Planning Services: CM Consult Post Acute Care Choice: [place] Living arrangements for the past 2 months: Apartment           DME Arranged: N/A DME Agency: NA  Readmission Risk Interventions     No data to display

## 2022-03-26 ENCOUNTER — Inpatient Hospital Stay (HOSPITAL_COMMUNITY): Payer: Medicare HMO

## 2022-03-26 DIAGNOSIS — D509 Iron deficiency anemia, unspecified: Secondary | ICD-10-CM | POA: Diagnosis not present

## 2022-03-26 LAB — CBC
HCT: 27 % — ABNORMAL LOW (ref 36.0–46.0)
Hemoglobin: 7.6 g/dL — ABNORMAL LOW (ref 12.0–15.0)
MCH: 22.9 pg — ABNORMAL LOW (ref 26.0–34.0)
MCHC: 28.1 g/dL — ABNORMAL LOW (ref 30.0–36.0)
MCV: 81.3 fL (ref 80.0–100.0)
Platelets: 200 10*3/uL (ref 150–400)
RBC: 3.32 MIL/uL — ABNORMAL LOW (ref 3.87–5.11)
RDW: 15.1 % (ref 11.5–15.5)
WBC: 5.4 10*3/uL (ref 4.0–10.5)
nRBC: 0 % (ref 0.0–0.2)

## 2022-03-26 LAB — MAGNESIUM: Magnesium: 1.9 mg/dL (ref 1.7–2.4)

## 2022-03-26 LAB — TYPE AND SCREEN
ABO/RH(D): O POS
Antibody Screen: NEGATIVE

## 2022-03-26 LAB — BASIC METABOLIC PANEL
Anion gap: 8 (ref 5–15)
BUN: 10 mg/dL (ref 8–23)
CO2: 29 mmol/L (ref 22–32)
Calcium: 8.7 mg/dL — ABNORMAL LOW (ref 8.9–10.3)
Chloride: 102 mmol/L (ref 98–111)
Creatinine, Ser: 0.89 mg/dL (ref 0.44–1.00)
GFR, Estimated: >60 mL/min (ref >60)
Glucose, Bld: 86 mg/dL (ref 70–99)
Potassium: 3.7 mmol/L (ref 3.5–5.1)
Sodium: 139 mmol/L (ref 135–145)

## 2022-03-26 MED ORDER — LOSARTAN POTASSIUM 50 MG PO TABS: 50.0000 mg | ORAL_TABLET | Freq: Every day | ORAL | Status: DC

## 2022-03-26 NOTE — TOC Progression Note (Signed)
Transition of Care [facility]) - Progression Note   Patient Details  Name: Julia Hernandez MRN: [id number] Date of Birth: 05-May-1936  Transition of Care [facility]) CM/SW [place], LCSW Phone Number: 03/26/2022, 1:59 PM  Clinical Narrative: CSW met with patient to review bed choices and patient chose [facility]. CSW explained to patient she would not be able to discharge to rehab until after the kyphoplasty. CSW contacted Teena in admissions at [place] to secure bed and is awaiting a response.  Expected Discharge Plan: Skilled Nursing Facility Barriers to Discharge: Continued Medical Work up, SNF Pending bed offer, Ship broker  Expected Discharge Plan and Services Expected Discharge Plan: [place] In-house Referral: Clinical Social Work Discharge Planning Services: CM Consult Post Acute Care Choice: [place] Living arrangements for the past 2 months: Apartment           DME Arranged: N/A DME Agency: NA  Readmission Risk Interventions     No data to display

## 2022-03-26 NOTE — Progress Notes (Signed)
Physical Therapy Treatment Patient Details Name: Julia Hernandez MRN: [id number] DOB: 07/01/1936 Today's Date: 03/26/2022   History of Present Illness Pt is an 86 y.o. female admitted for anemia and with complaints of weakness, chest pain rating to the back.  This makes her fourth ER visit in the last 4 weeks. Patient had a CTPA done on May the 16, 2023 at the [facility] -  She had T8/T9 and L2 compression fractures.  PMH significant for COPD, osteoporosis, coronary disease, CKD stage IIIa, and mitral valve regurgitation.    PT Comments    Pt tolerated increased activity level today, she ambulated 60' +24' with seated rest break, no loss of balance using RW. Performed seated BUE/LE strengthening exercises.    Recommendations for follow up therapy are one component of a multi-disciplinary discharge planning process, led by the attending physician.  Recommendations may be updated based on patient status, additional functional criteria and insurance authorization.  Follow Up Recommendations  Skilled nursing-short term rehab (<3 hours/day)     Assistance Recommended at Discharge    Patient can return home with the following A little help with walking and/or transfers;Assist for transportation;Assistance with cooking/housework;A little help with bathing/dressing/bathroom   Equipment Recommendations  None recommended by PT    Recommendations for Other Services       Precautions / Restrictions Precautions Precautions: Fall;Back Precaution Comments: compression fxs Restrictions Weight Bearing Restrictions: No     Mobility  Bed Mobility               General bed mobility comments: up in recliner    Transfers Overall transfer level: Needs assistance Equipment used: Rolling walker (2 wheels) Transfers: Sit to/from Stand Sit to Stand: Min guard           General transfer comment: VCs for hand placement    Ambulation/Gait   Gait Distance (Feet): 60  Feet Assistive device: Rolling walker (2 wheels) Gait Pattern/deviations: Step-through pattern, Decreased stride length Gait velocity: decr     General Gait Details: 75' + 24' with seated rest break, no loss of balance, HR 114   Stairs             Wheelchair Mobility    Modified Rankin (Stroke Patients Only)       Balance Overall balance assessment: Needs assistance, Mild deficits observed, not formally tested Sitting-balance support: Feet supported, No upper extremity supported Sitting balance-Leahy Scale: Good     Standing balance support: Bilateral upper extremity supported, During functional activity Standing balance-Leahy Scale: Fair                              Cognition Arousal/Alertness: Awake/alert Behavior During Therapy: WFL for tasks assessed/performed Overall Cognitive Status: Within Functional Limits for tasks assessed                                          Exercises General Exercises - Upper Extremity Shoulder Flexion: AROM, Both, 10 reps, Seated General Exercises - Lower Extremity Long Arc Quad: Both, 15 reps, Seated Hip Flexion/Marching: AROM, Both, 15 reps, Seated    General Comments        Pertinent Vitals/Pain Pain Assessment Pain Score: 5  Pain Location: bilateral rib cage region and radiating around back (pt reports this is chronic) Pain Descriptors / Indicators: Aching, Grimacing Pain Intervention(s): Limited activity  within patient's tolerance, Monitored during session, Premedicated before session    Home Living                          Prior Function            PT Goals (current goals can now be found in the care plan section) Acute Rehab PT Goals Patient Stated Goal: go to rehab to get stronger PT Goal Formulation: With patient Time For Goal Achievement: 04/03/22 Potential to Achieve Goals: Good Progress towards PT goals: Progressing toward goals    Frequency    Min  2X/week      PT Plan Current plan remains appropriate    Co-evaluation              AM-PAC PT "6 Clicks" Mobility   Outcome Measure  Help needed turning from your back to your side while in a flat bed without using bedrails?: None Help needed moving from lying on your back to sitting on the side of a flat bed without using bedrails?: A Little Help needed moving to and from a bed to a chair (including a wheelchair)?: A Little Help needed standing up from a chair using your arms (e.g., wheelchair or bedside chair)?: A Little Help needed to walk in hospital room?: A Little Help needed climbing 3-5 steps with a railing? : A Lot 6 Click Score: 18    End of Session Equipment Utilized During Treatment: Gait belt Activity Tolerance: Patient tolerated treatment well Patient left: in chair;with chair alarm set;with call bell/phone within reach Nurse Communication: Mobility status PT Visit Diagnosis: Unsteadiness on feet (R26.81);Muscle weakness (generalized) (M62.81);Pain     Time: 0272-5366 PT Time Calculation (min) (ACUTE ONLY): 18 min  Charges:  $Gait Training: 8-22 mins                     Ralene Bathe Kistler PT 03/26/2022  Acute Rehabilitation Services  Office [phone]

## 2022-03-26 NOTE — Progress Notes (Signed)
Progress Note Patient: Julia Hernandez NID:[id number] DOB: [birth year] DOA: 03/19/2022  DOS: the patient was seen and examined on 03/26/2022  Brief hospital course: Past medical history of CAD, CKD stage IIIa, HTN, COPD, compression fracture presents to the hospital with complaints of chronic chest pain, generalized weakness as well as back pain. Found to have iron deficiency anemia and melena for which GI was consulted and underwent EGD which was negative. Cardiology was consulted and patient was taken off of the Brilinta. Patient continues to report chest pain and back pain as well as constipation. Assessment and Plan: Acute on chronic back pain as well as chest pain. Multiple compression fractures Primary reason for patient's presentation. This pain has been ongoing for almost 4 years but worsened for last 1 month. Patient had 4 ER visit with this complaint. CT angio chest negative for PE.  CT abdomen pelvis negative for any acute intra-abdominal pathology. Patient appears to have age-indeterminate severe compression fracture at T8 and T9, chronic moderate compression fracture at L2.  Also minor compression fracture seen on T11-L1. Patient continues to report severe pain but controlled with current regimen. We will continue oral oxycodone. PT OT consulted as well.  Recommends SNF. IR consulted.  Recommend MRI without contrast.  Will discuss with patient as the patient has concern with MRI and has concern with claustrophobia.  Most likely will need outpatient work-up for this.   Chronic chest pain. Work-up including troponins, EKG, CT PE protocol has been negative. Continue pain control.  Repeat chest x-ray on 6/15 negative for any acute abnormality.  Prior history of CAD Follows up with [facility] cardiology. SP angioplasty and graft in 2020. Patient was on Brilinta, based on conversation between cardiology and prior provider is now discontinued due to increased risk for bleeding. Continue  baby aspirin. Echocardiogram showed EF of 55% with diastolic dysfunction.   Iron deficiency anemia. Melena. Patient had history of intermittent black stool. Patient was on Brilinta prior to admission. Baseline hemoglobin was around 11. Currently hemoglobin is around 7. No bleeding seen here in the hospital. FOBT was positive. B12 was adequate. Iron level was severely low. Underwent EGD which did not show any evidence of active bleeding. Continuing daily PPI. We will provide IV iron. Continue multivitamins. Monitor CBC. Transfuse for hemoglobin less than 7 or hemodynamic instability.   COPD. Appears to be stable without any exacerbation. Continuing inhalers. Suspect this is causing patient's chest tightness feeling.   Acute kidney injury on chronic kidney disease stage IIIa. Baseline appears to be around 0.8. On presentation serum creatinine was 1.36. Patient is on HCTZ and Cozaar both of which are on hold. Serum creatinine improving for now.   Hypokalemia. Replaced.   Nausea. Constipation. X-ray ordered, patient is unable to tolerate the x-ray. We will continue bowel regimen for now.   Anxiety./Mood disorder. Patient is on Zoloft which I will continue.   Moderate protein calorie malnutrition. Body mass index is 21.61 kg/m. Nutrition Problem: Moderate Malnutrition Etiology: chronic illness, dysphagia (abdominal pain) Nutrition Interventions: Interventions: Ensure Enlive (each supplement provides 350kcal and 20 grams of protein), Boost Breeze, MVI   Subjective: Continues to have back pain.  Continues to have chest pain.  No nausea or vomiting.  Continues to have shortness of breath.  Oral intake still minimal.  No BM.  Passing gas.  Physical Exam: Vitals:   03/26/22 0500 03/26/22 0540 03/26/22 0911 03/26/22 1300  BP:  (!) 164/77  (!) 116/58  Pulse:  94  92  Resp:  18  18  Temp:  98.2 F (36.8 C)  98.3 F (36.8 C)  TempSrc:  Oral  Oral  SpO2:  95% 94% 96%   Weight: 47.3 kg     Height:       General: Appear in moderate distress; no visible Abnormal Neck Mass Or lumps, Conjunctiva normal Cardiovascular: S1 and S2 Present, no Murmur, Respiratory: good respiratory effort, Bilateral Air entry present and  faint Crackles, Occasional  wheezes Abdomen: Bowel Sound present, Non tender  Extremities: no Pedal edema Neurology: alert and oriented to time, place, and person  Gait not checked due to patient safety concerns   Data Reviewed: I have Reviewed nursing notes, Vitals, and Lab results since pt's last encounter. Pertinent lab results CBC and BMP I have ordered test including CBC and BMP I have discussed pt's care plan and test results with IR.   Family Communication: None at bedside  Disposition: Status is: Inpatient Remains inpatient appropriate because: Needing pain control for compression fracture as well as most likely will need MRI.  Author: Lynden Oxford, MD 03/26/2022 6:41 PM  Please look on [email or web address] to find out who is on call.

## 2022-03-27 ENCOUNTER — Encounter (HOSPITAL_COMMUNITY): Payer: Self-pay | Admitting: Gastroenterology

## 2022-03-27 DIAGNOSIS — D509 Iron deficiency anemia, unspecified: Secondary | ICD-10-CM | POA: Diagnosis not present

## 2022-03-27 LAB — BASIC METABOLIC PANEL
Anion gap: 10 (ref 5–15)
BUN: 14 mg/dL (ref 8–23)
CO2: 29 mmol/L (ref 22–32)
Calcium: 8.7 mg/dL — ABNORMAL LOW (ref 8.9–10.3)
Chloride: 102 mmol/L (ref 98–111)
Creatinine, Ser: 0.92 mg/dL (ref 0.44–1.00)
GFR, Estimated: >60 mL/min (ref >60)
Glucose, Bld: 90 mg/dL (ref 70–99)
Potassium: 3.5 mmol/L (ref 3.5–5.1)
Sodium: 141 mmol/L (ref 135–145)

## 2022-03-27 LAB — CBC
HCT: 25.1 % — ABNORMAL LOW (ref 36.0–46.0)
Hemoglobin: 7.1 g/dL — ABNORMAL LOW (ref 12.0–15.0)
MCH: 22.8 pg — ABNORMAL LOW (ref 26.0–34.0)
MCHC: 28.3 g/dL — ABNORMAL LOW (ref 30.0–36.0)
MCV: 80.4 fL (ref 80.0–100.0)
Platelets: 202 10*3/uL (ref 150–400)
RBC: 3.12 MIL/uL — ABNORMAL LOW (ref 3.87–5.11)
RDW: 15.2 % (ref 11.5–15.5)
WBC: 4.8 10*3/uL (ref 4.0–10.5)
nRBC: 0 % (ref 0.0–0.2)

## 2022-03-27 LAB — MAGNESIUM: Magnesium: 1.9 mg/dL (ref 1.7–2.4)

## 2022-03-27 MED ORDER — SENNOSIDES-DOCUSATE SODIUM 8.6-50 MG PO TABS
2.0000 | ORAL_TABLET | Freq: Two times a day (BID) | ORAL | Status: DC
Start: 2022-03-27 — End: 2022-03-30
  Administered 2022-03-27 – 2022-03-30 (×6): 2 via ORAL
  Filled 2022-03-27 (×7): qty 2

## 2022-03-27 MED ORDER — POLYETHYLENE GLYCOL 3350 17 G PO PACK
17.0000 g | PACK | Freq: Every day | ORAL | Status: DC | PRN
Start: 2022-03-27 — End: 2022-03-30

## 2022-03-27 NOTE — Progress Notes (Signed)
Progress Note Patient: Julia Hernandez ASN:[id number] DOB: 11-18-1935 DOA: 03/19/2022  DOS: the patient was seen and examined on 03/27/2022  Brief hospital course: Past medical history of CAD, CKD stage IIIa, HTN, COPD, compression fracture presents to the hospital with complaints of chronic chest pain, generalized weakness as well as back pain. Found to have iron deficiency anemia and melena for which GI was consulted and underwent EGD which was negative. Cardiology was consulted and patient was taken off of the Brilinta. Patient continues to report chest pain and back pain as well as constipation. Assessment and Plan: Acute on chronic back pain as well as chest pain. Multiple compression fractures Primary reason for patient's presentation. This pain has been ongoing for almost 4 years but worsened for last 1 month. Patient had 4 ER visit with this complaint. CT angio chest negative for PE.  CT abdomen pelvis negative for any acute intra-abdominal pathology. Patient appears to have age-indeterminate severe compression fracture at T8 and T9, chronic moderate compression fracture at L2.  Also minor compression fracture seen on T11-L1. Patient continues to report severe pain but controlled with current regimen. We will continue oral oxycodone. PT OT consulted as well.  Recommends SNF. IR consulted for kyphoplasty.  IR recommend MRI without contrast to ensure acuteness of the fracture. Discussed with the patient.  Patient would like to avoid closed MRI. Also do not feel the patient is a good candidate for anesthesia or IV sedation. Therefore current plan is to continue pain control with physical therapy and plan for transition to rehab. If this will not improve patient will have option to follow-up with IR outpatient and consider open MRI outpatient.   Chronic chest pain. Work-up including troponins, EKG, CT PE protocol has been negative. Continue pain control.  Repeat chest x-ray on 6/15 negative  for any acute abnormality.  Prior history of CAD Follows up with [facility] cardiology. SP angioplasty and graft in 2020. Patient was on Brilinta, based on conversation between cardiology and prior provider is now discontinued due to increased risk for bleeding. Continue aspirin. Echocardiogram showed EF of 55% with diastolic dysfunction.   Iron deficiency anemia. Melena. Patient had history of intermittent black stool. Patient was on Brilinta prior to admission. Baseline hemoglobin was around 11. Currently hemoglobin is around 7. No bleeding seen here in the hospital. FOBT was positive. B12 was adequate. Iron level was severely low. Underwent EGD which did not show any evidence of active bleeding. Continuing daily PPI. Received 1 dose of IV iron. Continue multivitamins. Transfuse for hemoglobin less than 7 or hemodynamic instability.   COPD. Appears to be stable without any exacerbation. Continuing inhalers. Suspect this is causing patient's chest tightness feeling.   Acute kidney injury on chronic kidney disease stage IIIa. Baseline appears to be around 0.8. On presentation serum creatinine was 1.36. Patient is on HCTZ and Cozaar both of which are on hold. Serum creatinine improving for now.   Hypokalemia. Replaced.   Nausea. Constipation. X-ray abdomen ordered, patient is unable to tolerate the x-ray.,  Unable to perform or complete. We will continue bowel regimen for now.   Anxiety./Mood disorder. Patient is on Zoloft which I will continue.   Moderate protein calorie malnutrition. Body mass index is 21.61 kg/m. Nutrition Problem: Moderate Malnutrition Etiology: chronic illness, dysphagia (abdominal pain) Nutrition Interventions: Interventions: Ensure Enlive (each supplement provides 350kcal and 20 grams of protein), Boost Breeze, MVI   Esophageal stenosis. EGD on 6/13 showed esophageal stenosis dilated. Recommendation is for dysphagia 3 diet.  Will increase  greatly.  Subjective: No nausea no vomiting no fever no chills.  Minimal oral intake.  Continues to have back pain.  Continues to have chest tightness reported as 4 out of 10.  Continues to have shortness of breath as well.  Physical Exam: Vitals:   03/26/22 2137 03/27/22 0446 03/27/22 0448 03/27/22 1326  BP: 138/69 (!) 142/62  112/74  Pulse: 94 93  95  Resp: 18 18  16   Temp: 98.4 F (36.9 C) 98.4 F (36.9 C)  98 F (36.7 C)  TempSrc: Oral Oral    SpO2: 97% 93%  95%  Weight:   47.4 kg   Height:       General: Appear in mild distress; no visible Abnormal Neck Mass Or lumps, Conjunctiva normal Cardiovascular: S1 and S2 Present, no Murmur, Respiratory: good respiratory effort, Bilateral Air entry present and CTA, no Crackles, no wheezes Abdomen: Bowel Sound present, Non tender  Extremities: no Pedal edema Neurology: alert and oriented to time, place, and person  Gait not checked due to patient safety concerns   Data Reviewed: I have Reviewed nursing notes, Vitals, and Lab results since pt's last encounter. Pertinent lab results CBC and BMP I have ordered test including CBC I have discussed pt's care plan and test results with interventional radiology.   Family Communication: Discussed with daughter on the phone  Disposition: Status is: Inpatient Remains inpatient appropriate because: Unsafe discharge.  Medically stable.  Awaiting insurance authorization for placement.  Author: , MD 03/27/2022 5:58 PM  Please look on [email or web address] to find out who is on call.

## 2022-03-27 NOTE — TOC Progression Note (Signed)
Transition of Care [facility]) - Progression Note   Patient Details  Name: Julia Hernandez MRN: [id number] Date of Birth: 30-Jun-1936  [facility]) CM/SW Contact  Ewing Schlein, LCSW Phone Number: 03/27/2022, 1:41 PM  Clinical Narrative: CSW informed patient is ready to discharge to SNF and is declining the kyphoplasty at this time. CSW confirmed bed with Randa Lynn at [facility] pending insurance approval. CMA assisting with insurance authorization. TOC awaiting insurance approval for SNF.  Expected Discharge Plan: Skilled Nursing Facility Barriers to Discharge: Continued Medical Work up, SNF Pending bed offer, English as a second language teacher  Expected Discharge Plan and Services Expected Discharge Plan: Skilled Nursing Facility In-house Referral: Clinical Social Work Discharge Planning Services: CM Consult Post Acute Care Choice: Skilled Nursing Facility Living arrangements for the past 2 months: Apartment           DME Arranged: N/A DME Agency: NA  Readmission Risk Interventions     No data to display

## 2022-03-27 NOTE — Progress Notes (Signed)
Orthopedic Tech Progress Note Patient Details:  Julia Hernandez [birth year] [id number] TLSO has been ordered from [facility]  Patient ID: Julia Hernandez, female   DOB: 1935/10/30, 86 y.o.   MRN: [id number]  Smitty Pluck 03/27/2022, 11:24 AM

## 2022-03-28 DIAGNOSIS — D509 Iron deficiency anemia, unspecified: Secondary | ICD-10-CM | POA: Diagnosis not present

## 2022-03-28 NOTE — Progress Notes (Signed)
[facility] PROGRESS NOTE  Patient: Julia Hernandez ZES:[id number]   PCP: Sigmund Hazel, MD DOB: [birth year]   DOA: 03/19/2022   DOS: 03/28/2022    Subjective: Pain well controlled.  Passing gas but no BM.  Oral intake improving.  No nausea no vomiting.  Continues to have the chest tightness that she has reported having since her first admission today.  Objective:  Vitals:   03/28/22 0946 03/28/22 1315  BP:  139/89  Pulse:  90  Resp:  18  Temp:    SpO2: 94% 97%    S1-S2 present. Clear to auscultation. Bowel sound present. No tenderness. Trace edema in the lower extremities seen.  Assessment and plan: Ongoing back pain and chest pain. Continue pain control.  Iron deficiency anemia. We will recheck CBC. Initiate chemical DVT prophylaxis if the CBC is stable.  Disposition. Currently awaiting insurance authorization.  Medically stable.  Author: Lynden Oxford, MD [facility] 03/28/2022 7:01 PM   If 7PM-7AM, please contact night-coverage at [email or web address]

## 2022-03-28 NOTE — Plan of Care (Signed)

## 2022-03-29 DIAGNOSIS — D509 Iron deficiency anemia, unspecified: Secondary | ICD-10-CM | POA: Diagnosis not present

## 2022-03-29 LAB — CBC
HCT: 25.2 % — ABNORMAL LOW (ref 36.0–46.0)
Hemoglobin: 7 g/dL — ABNORMAL LOW (ref 12.0–15.0)
MCH: 22.8 pg — ABNORMAL LOW (ref 26.0–34.0)
MCHC: 27.8 g/dL — ABNORMAL LOW (ref 30.0–36.0)
MCV: 82.1 fL (ref 80.0–100.0)
Platelets: 249 10*3/uL (ref 150–400)
RBC: 3.07 MIL/uL — ABNORMAL LOW (ref 3.87–5.11)
RDW: 15.6 % — ABNORMAL HIGH (ref 11.5–15.5)
WBC: 5.3 10*3/uL (ref 4.0–10.5)
nRBC: 0 % (ref 0.0–0.2)

## 2022-03-29 NOTE — Progress Notes (Signed)
[facility] PROGRESS NOTE  Patient: Julia Hernandez NIO:[id number]   PCP: Sigmund Hazel, MD DOB: 10/19/1935   DOA: 03/19/2022   DOS: 03/29/2022    Subjective: No nausea no vomiting but no fever no chills.  Passing gas.  Had a bowel movement yesterday.  Objective:  Vitals:   03/29/22 2029 03/29/22 2055  BP: (!) 119/57   Pulse: 84   Resp: 18   Temp: 98.1 F (36.7 C)   SpO2: 93% 94%    S1-S2 present. Clear to auscultation. No edema in the lower extremity. Bowel sound present.  Assessment and plan: Anemia. We will check CBC again tomorrow. Transfuse for hemoglobin less than 7. We will check type and screen as well.  Back pain is improving.  Chest pain is improving.  Medically otherwise stable.  Author: Lynden Oxford, MD [facility] 03/29/2022 9:36 PM   If 7PM-7AM, please contact night-coverage at [email or web address]

## 2022-03-30 DIAGNOSIS — D509 Iron deficiency anemia, unspecified: Secondary | ICD-10-CM | POA: Diagnosis not present

## 2022-03-30 LAB — TYPE AND SCREEN
ABO/RH(D): O POS
Antibody Screen: NEGATIVE

## 2022-03-30 LAB — CBC
HCT: 25.4 % — ABNORMAL LOW (ref 36.0–46.0)
Hemoglobin: 7.1 g/dL — ABNORMAL LOW (ref 12.0–15.0)
MCH: 23.4 pg — ABNORMAL LOW (ref 26.0–34.0)
MCHC: 28 g/dL — ABNORMAL LOW (ref 30.0–36.0)
MCV: 83.6 fL (ref 80.0–100.0)
Platelets: 260 10*3/uL (ref 150–400)
RBC: 3.04 MIL/uL — ABNORMAL LOW (ref 3.87–5.11)
RDW: 17.3 % — ABNORMAL HIGH (ref 11.5–15.5)
WBC: 5.6 10*3/uL (ref 4.0–10.5)
nRBC: 0 % (ref 0.0–0.2)

## 2022-03-30 MED ORDER — POLYETHYLENE GLYCOL 3350 17 G PO PACK: 17.0000 g | PACK | Freq: Every day | ORAL | 0 refills | Status: DC | PRN

## 2022-03-30 MED ORDER — OXYCODONE HCL 5 MG PO TABS: 2.5000 mg | ORAL_TABLET | ORAL | 0 refills | Status: DC | PRN

## 2022-03-30 MED ORDER — B COMPLEX-C PO TABS: 1.0000 | ORAL_TABLET | Freq: Every day | ORAL | 0 refills | Status: DC

## 2022-03-30 MED ORDER — BOOST PLUS PO LIQD: 237.0000 mL | Freq: Three times a day (TID) | ORAL | 0 refills | Status: DC

## 2022-03-30 MED ORDER — PANTOPRAZOLE SODIUM 40 MG PO TBEC: 40.0000 mg | DELAYED_RELEASE_TABLET | Freq: Every day | ORAL | 0 refills | Status: DC

## 2022-03-30 MED ORDER — FERROUS SULFATE 325 (65 FE) MG PO TABS: 325.0000 mg | ORAL_TABLET | Freq: Two times a day (BID) | ORAL | 0 refills | Status: DC

## 2022-03-30 MED ORDER — FUROSEMIDE 20 MG PO TABS: 20.0000 mg | ORAL_TABLET | Freq: Every day | ORAL | 0 refills | Status: DC | PRN

## 2022-03-30 MED ORDER — ASPIRIN 81 MG PO TBEC: 81.0000 mg | DELAYED_RELEASE_TABLET | Freq: Every day | ORAL | 0 refills | Status: DC

## 2022-03-30 MED ORDER — BOOST PLUS PO LIQD
237.0000 mL | Freq: Three times a day (TID) | ORAL | Status: DC
  Filled 2022-03-30: qty 237

## 2022-03-30 MED ORDER — DOCUSATE SODIUM 100 MG PO CAPS: 100.0000 mg | ORAL_CAPSULE | Freq: Two times a day (BID) | ORAL | 2 refills | Status: AC

## 2022-03-30 MED ORDER — DIAZEPAM 5 MG PO TABS: 5.0000 mg | ORAL_TABLET | Freq: Every day | ORAL | 0 refills | Status: DC | PRN

## 2022-03-30 MED ORDER — MELATONIN 10 MG PO TABS: 5.0000 mg | ORAL_TABLET | Freq: Every evening | ORAL | 0 refills | Status: DC | PRN

## 2022-03-30 NOTE — Progress Notes (Signed)
Physical Therapy Treatment Patient Details Name: Julia Hernandez MRN: [id number] DOB: Dec 10, 1935 Today's Date: 03/30/2022   History of Present Illness Pt is an 86 y.o. female admitted for anemia and with complaints of weakness, chest pain rating to the back.  This makes her fourth ER visit in the last 4 weeks. Patient had a CTPA done on May the 16, 2023 at the [facility] -  She had T8/T9 and L2 compression fractures.  PMH significant for COPD, osteoporosis, coronary disease, CKD stage IIIa, and mitral valve regurgitation.    PT Comments    Pt is pleasant, cooperative and motivated. Improving balance and stability however fatigues rapidly this date with amb, requiring seated rest.  Pain improving however pt  feels TLSO exacerbates this. Pt is at risk for falls and  will benefit from SNF.    Recommendations for follow up therapy are one component of a multi-disciplinary discharge planning process, led by the attending physician.  Recommendations may be updated based on patient status, additional functional criteria and insurance authorization.  Follow Up Recommendations  Skilled nursing-short term rehab (<3 hours/day)     Assistance Recommended at Discharge Intermittent Supervision/Assistance  Patient can return home with the following A little help with walking and/or transfers;Assist for transportation;Assistance with cooking/housework;A little help with bathing/dressing/bathroom   Equipment Recommendations  None recommended by PT    Recommendations for Other Services       Precautions / Restrictions Precautions Precautions: Fall;Back Precaution Comments: compression fxs Required Braces or Orthoses: Spinal Brace Spinal Brace: Thoracolumbosacral orthotic;Other (comment) Spinal Brace Comments: donned EOB (not specified in orders except "on when amb") Restrictions Weight Bearing Restrictions: No Other Position/Activity Restrictions: TLSO is ill fitting, pt does not like  to wear it but she will     Mobility  Bed Mobility Overal bed mobility: Modified Independent                  Transfers Overall transfer level: Needs assistance Equipment used: Rolling walker (2 wheels) Transfers: Sit to/from Stand Sit to Stand: Min guard           General transfer comment: VCs for hand placement    Ambulation/Gait Ambulation/Gait assistance: Min guard Gait Distance (Feet): 18 Feet Assistive device: Rolling walker (2 wheels) Gait Pattern/deviations: Step-through pattern, Decreased stride length, Trunk flexed Gait velocity: decr     General Gait Details: pt fatigues quickly this session and requests seated rest. demonstrates improved stability and correct position of RW from self.   Stairs             Wheelchair Mobility    Modified Rankin (Stroke Patients Only)       Balance Overall balance assessment: Needs assistance Sitting-balance support: Feet supported, No upper extremity supported Sitting balance-Leahy Scale: Good     Standing balance support: Bilateral upper extremity supported, During functional activity Standing balance-Leahy Scale: Fair Standing balance comment: reliant on UE support for balance                            Cognition Arousal/Alertness: Awake/alert Behavior During Therapy: WFL for tasks assessed/performed Overall Cognitive Status: Within Functional Limits for tasks assessed                                          Exercises General Exercises - Lower Extremity Ankle Circles/Pumps: AROM, Both,  15 reps, Supine    General Comments        Pertinent Vitals/Pain Pain Assessment Pain Assessment: Faces Faces Pain Scale: Hurts a little bit Pain Location: mid and low back Pain Descriptors / Indicators: Discomfort Pain Intervention(s): Limited activity within patient's tolerance, Monitored during session, Repositioned    Home Living                           Prior Function            PT Goals (current goals can now be found in the care plan section) Acute Rehab PT Goals Patient Stated Goal: go to rehab to get stronger PT Goal Formulation: With patient Time For Goal Achievement: 04/03/22 Potential to Achieve Goals: Good Progress towards PT goals: Progressing toward goals    Frequency    Min 2X/week      PT Plan Current plan remains appropriate    Co-evaluation              AM-PAC PT "6 Clicks" Mobility   Outcome Measure  Help needed turning from your back to your side while in a flat bed without using bedrails?: None Help needed moving from lying on your back to sitting on the side of a flat bed without using bedrails?: None Help needed moving to and from a bed to a chair (including a wheelchair)?: A Little Help needed standing up from a chair using your arms (e.g., wheelchair or bedside chair)?: A Little Help needed to walk in hospital room?: A Little Help needed climbing 3-5 steps with a railing? : A Lot 6 Click Score: 19    End of Session Equipment Utilized During Treatment: Gait belt Activity Tolerance: Patient tolerated treatment well Patient left: in chair;with call bell/phone within reach;with chair alarm set Nurse Communication: Mobility status PT Visit Diagnosis: Unsteadiness on feet (R26.81);Muscle weakness (generalized) (M62.81);Pain Pain - part of body:  (back)     Time: 5397-6734 PT Time Calculation (min) (ACUTE ONLY): 21 min  Charges:  $Gait Training: 8-22 mins                     Delice Bison, PT  Acute Rehab Dept ([facility]) [phone] Pager [phone]  03/30/2022    [facility] 03/30/2022, 10:48 AM

## 2022-03-30 NOTE — TOC Transition Note (Signed)
Transition of Care [facility]) - CM/SW Discharge Note  Patient Details  Name: Julia Hernandez MRN: [id number] Date of Birth: September 18, 1936  Transition [facility]) CM/SW Contact:  Ewing Schlein, LCSW Phone Number: 03/30/2022, 11:25 AM  Clinical Narrative: Patient has been approved by her insurance for 6/19-6/21. Certification number is: [id number]. CSW confirmed bed with Teena in admissions at [facility]. Patient will go to room 118 bed A and the number for report is [phone]. Discharge summary, discharge orders, and SNF transfer report faxed to facility in hub.  Medical necessity form done; PTAR scheduled. Discharge packet completed. CSW notified patient of insurance approval and discharge and recommended patient call daughter to notify her of discharge. RN updated. TOC signing off.  Final next level of care: Skilled Nursing Facility Barriers to Discharge: Barriers Resolved  Patient Goals and CMS Choice Patient states their goals for this hospitalization and ongoing recovery are:: Go to rehab and then return home CMS Medicare.gov Compare Post Acute Care list provided to:: Patient Choice offered to / list presented to : Patient  Discharge Placement Existing PASRR number confirmed : 03/25/22          Patient chooses bed at: Other - please specify in the comment section below: ([facility]) Patient to be transferred to facility by: PTAR Patient and family notified of of transfer: 03/30/22  Discharge Plan and Services In-house Referral: Clinical Social Work Discharge Planning Services: CM Consult Post Acute Care Choice: Skilled Nursing Facility          DME Arranged: N/A DME Agency: NA  Readmission Risk Interventions     No data to display

## 2022-03-30 NOTE — Progress Notes (Signed)
Patient was picked up by PTAR and taken to [facility]. The packet was given to [facility]. Patient was stable for discharge.  I called twice to give report to the nurse. I waited 5 minutes with no answer after call was transferred. I then called and left my information for the nurse to call me when she was available.

## 2022-03-30 NOTE — Progress Notes (Signed)
CSW received call from patient's daughter, Ranelle Oyster, after patient had already discharged to SNF. Daughter stated, "I can't leave her there" and asked about other facilities. CSW explained this was the highest rated facility that made a bed offer and was the facility the patient chose. Daughter asked about why patient was denied by other SNFs. CSW referenced discussion with daughter last week when it was explained that due to patient having [employer] Medicare, there would be limited bed options as most SNFs are not in-network with [employer].  Daughter asked about nursing care at home. CSW again reiterated what was discussed last week regarding private duty care not being covered by insurance and [facility] will only cover short visits at home, not 24/7 care.  Daughter asked about patient's LTC insurance. CSW explained TOC does not set up LTC placement and family will need to follow up with the company patient is getting her policy through.

## 2022-03-30 NOTE — Discharge Summary (Signed)
Physician Discharge Summary   Patient: Julia Hernandez MRN: [id number] DOB: [birth year]  Admit date:     03/19/2022  Discharge date: 03/30/22  Discharge Physician: Berle Mull  PCP: Kathyrn Lass, MD  Recommendations at discharge: Follow-up with PCP in 1 week. Recheck CBC in 1 week.  Discharge Diagnoses: Principal Problem:   Anemia Active Problems:   Wedge compression fracture of unspecified lumbar vertebra, initial encounter for closed fracture (HCC)   Stage 3a chronic kidney disease (CKD) (HCC)   Chronic obstructive pulmonary disease, unspecified (HCC)   Nonrheumatic mitral valve regurgitation   Presence of coronary angioplasty implant and graft   Tobacco abuse   DNR (do not resuscitate)/DNI(Do Not Intubate)   AKI (acute kidney injury) ([place])   Malnutrition of moderate degree  Hospital Course: Past medical history of CAD, CKD stage IIIa, HTN, COPD, compression fracture presents to the hospital with complaints of chronic chest pain, generalized weakness as well as back pain. Found to have iron deficiency anemia and melena for which GI was consulted and underwent EGD which was negative. Cardiology was consulted and patient was taken off of the [place]. Patient continues to report chest pain and back pain as well as constipation.  Assessment and Plan: Acute on chronic back pain as well as chest pain. Multiple compression fractures Primary reason for patient's presentation. This pain has been ongoing for almost 4 years but worsened for last 1 month. Patient had 4 ER visit with this complaint. CT angio chest negative for PE.  CT abdomen pelvis negative for any acute intra-abdominal pathology. Patient appears to have age-indeterminate severe compression fracture at T8 and T9, chronic moderate compression fracture at L2.  Also minor compression fracture seen on T11-L1. Patient continues to report severe pain but controlled with current regimen. We will continue oral oxycodone. PT OT  consulted as well.  Recommends SNF. IR consulted for kyphoplasty.  IR recommend MRI without contrast to ensure acuteness of the fracture. Discussed with the patient.  Patient would like to avoid closed MRI. Also do not feel the patient is a good candidate for anesthesia or IV sedation. Therefore current plan is to continue pain control with physical therapy and plan for transition to rehab. If this will not improve patient will have option to follow-up with IR outpatient and consider open MRI outpatient. Continue TLSO brace as tolerated, only on exertion. We will continue pain control with oxycodone 2.5 mg every 4 hours as needed.   Chronic chest pain. Work-up including troponins, EKG, CT PE protocol has been negative. Continue pain control.  Repeat chest x-ray on 6/15 negative for any acute abnormality.  Prior history of CAD Follows up with [facility] cardiology. S/P angioplasty and graft in 2020. Patient was on Brilinta, based on conversation between cardiology and prior provider is now discontinued due to increased risk for bleeding. Continue aspirin. Echocardiogram showed EF of [id number] with diastolic dysfunction.   Iron deficiency anemia. Melena. Patient had history of intermittent black stool. Patient was on Brilinta prior to admission. Baseline hemoglobin was around 11. Currently hemoglobin is around 7. No bleeding seen here in the hospital. FOBT was positive. B12 was adequate. Iron level was severely low. Underwent EGD which did not show any evidence of active bleeding. Continuing daily PPI. Received 1 dose of IV iron. Continue multivitamins, iron, B complex Transfuse for hemoglobin less than 7 or hemodynamic instability.   COPD. Appears to be stable without any exacerbation. Continuing inhalers. Suspect this is causing patient's chest tightness. Continue as needed Valium  Acute kidney injury on chronic kidney disease stage IIIa. Baseline appears to be around 0.8. On  presentation serum creatinine was 1.36. Patient is on HCTZ and Cozaar both of which are on hold. Serum creatinine improving for now. Also switching Lasix to as needed only.   Hypokalemia. Replaced.   Nausea. Constipation. X-ray abdomen ordered, patient is unable to tolerate the x-ray.,  Unable to perform or complete. We will continue bowel regimen for now.   Anxiety./Mood disorder. Patient is on Zoloft which I will continue. Continue Valium as needed.   Moderate protein calorie malnutrition. Body mass index is 21.61 kg/m. Nutrition Problem: Moderate Malnutrition Etiology: chronic illness, dysphagia (abdominal pain) Nutrition Interventions: Interventions: Ensure Enlive (each supplement provides 350kcal and 20 grams of protein), Boost Breeze, MVI    Esophageal stenosis. EGD on 6/13 showed esophageal stenosis dilated. Recommendation is for dysphagia 3 diet. Will increase greatly.  Pain control - [employer] Controlled Substance Reporting System database was reviewed. and patient was instructed, not to drive, operate heavy machinery, perform activities at heights, swimming or participation in water activities or provide baby-sitting services while on Pain, Sleep and Anxiety Medications; until their outpatient Physician has advised to do so again. Also recommended to not to take more than prescribed Pain, Sleep and Anxiety Medications.   Consultants: GI, IR Procedures performed:  Echocardiogram  EGD DISCHARGE MEDICATION: Allergies as of 03/30/2022       Reactions   Influenza Vac Split Quad Shortness Of Breath   Ditropan Xl [oxybutynin] Other (See Comments)   Unknown reaction   Simvastatin Rash        Medication List     STOP taking these medications    Brilinta 60 MG Tabs tablet Generic drug: ticagrelor   chlorhexidine 0.12 % solution Commonly known as: PERIDEX   losartan 100 MG tablet Commonly known as: COZAAR       TAKE these medications     acetaminophen 500 MG tablet Commonly known as: TYLENOL Take 500 mg by mouth 5 (five) times daily.   acyclovir ointment 5 % Commonly known as: ZOVIRAX Apply 1 application topically 3 (three) times daily as needed (outbreaks).   aspirin EC 81 MG tablet Take 1 tablet (81 mg total) by mouth daily. Swallow whole. Start taking on: March 31, 2022   atorvastatin 40 MG tablet Commonly known as: LIPITOR Take 40 mg by mouth daily.   B-complex with vitamin C tablet Take 1 tablet by mouth daily. Start taking on: March 31, 2022   Combivent Respimat 20-100 MCG/ACT Aers respimat Generic drug: Ipratropium-Albuterol Inhale 1 puff into the lungs 4 (four) times daily as needed for shortness of breath.   diazepam 5 MG tablet Commonly known as: VALIUM Take 1 tablet (5 mg total) by mouth daily as needed for anxiety.   docusate sodium 100 MG capsule Commonly known as: Colace Take 1 capsule (100 mg total) by mouth 2 (two) times daily.   ferrous sulfate 325 (65 FE) MG tablet Take 1 tablet (325 mg total) by mouth 2 (two) times daily with a meal.   furosemide 20 MG tablet Commonly known as: LASIX Take 1 tablet (20 mg total) by mouth daily as needed (weight gain of >3 lbs in 1 day or >5 lbs in 1 week). What changed:  when to take this reasons to take this   lactose free nutrition Liqd Take 237 mLs by mouth 3 (three) times daily with meals.   magic mouthwash w/lidocaine Soln Take 5 mLs by mouth 4 (  four) times daily as needed for mouth pain.   Melatonin 10 MG Tabs Take 5 mg by mouth at bedtime as needed.   metoprolol succinate 25 MG 24 hr tablet Commonly known as: TOPROL-XL Take 12.5 mg by mouth at bedtime.   oxyCODONE 5 MG immediate release tablet Commonly known as: Oxy IR/ROXICODONE Take 0.5 tablets (2.5 mg total) by mouth every 4 (four) hours as needed for moderate pain.   pantoprazole 40 MG tablet Commonly known as: PROTONIX Take 1 tablet (40 mg total) by mouth daily. Start  taking on: March 31, 2022   polyethylene glycol 17 g packet Commonly known as: MIRALAX / GLYCOLAX Take 17 g by mouth daily as needed for mild constipation.   sertraline 100 MG tablet Commonly known as: ZOLOFT Take 100 mg by mouth at bedtime.   Symbicort 160-4.5 MCG/ACT inhaler Generic drug: budesonide-formoterol Inhale 2 puffs into the lungs 2 (two) times daily.        Contact information for follow-up providers     Sigmund Hazel, MD. Schedule an appointment as soon as possible for a visit in 1 week(s).   Specialty: Family Medicine Why: with CBC and BMP Contact information: [address] [place] [address] [phone]              Contact information for after-discharge care     Destination     [facility] Preferred SNF .   Service: Skilled Nursing Contact information: [address] [place] [address] [phone]                    Disposition: SNF Diet recommendation: Dysphagia type 3 thin liquid  Discharge Exam: Vitals:   03/29/22 2029 03/29/22 2055 03/30/22 0625 03/30/22 0731  BP: (!) 119/57  (!) 125/50   Pulse: 84  88   Resp: 18  18   Temp: 98.1 F (36.7 C)  98.1 F (36.7 C)   TempSrc: Oral  Oral   SpO2: 93% 94% 98% 98%  Weight:      Height:       General: Appear in no distress; no visible Abnormal Neck Mass Or lumps, Conjunctiva normal Cardiovascular: S1 and S2 Present, no Murmur, Respiratory: good respiratory effort, Bilateral Air entry present and faint basal crackles, no wheezes Abdomen: Bowel Sound present, Non tender  Extremities: no Pedal edema Neurology: alert and oriented to time, place, and person  Gait not checked due to patient safety concerns Filed Weights   03/27/22 0448 03/28/22 0500 03/29/22 0500  Weight: 47.4 kg 49.4 kg 49.7 kg   Condition at discharge: stable  The results of significant diagnostics from this hospitalization (including imaging, microbiology,  ancillary and laboratory) are listed below for reference.   Imaging Studies: DG CHEST PORT 1 VIEW  Result Date: 03/26/2022 CLINICAL DATA:  Nausea, vomiting, constipation chest pain EXAM: PORTABLE CHEST 1 VIEW COMPARISON:  03/07/2022 FINDINGS: Cardiac and mediastinal contours are within normal limits. No focal pulmonary opacity. No pleural effusion or pneumothorax. No acute osseous abnormality. IMPRESSION: No acute cardiopulmonary process. Electronically Signed   By: Wiliam Ke M.D.   On: 03/26/2022 17:00   ECHOCARDIOGRAM COMPLETE  Result Date: 03/20/2022    ECHOCARDIOGRAM REPORT   Patient Name:   YALDA HERD Date of Exam: 03/20/2022 Medical Rec #:  [id number]    Height:       57.0 in Accession #:    [id number]   Weight:       102.5 lb Date of  Birth:  1935/12/31     BSA:          1.354 m Patient Age:    85 years     BP:           131/50 mmHg Patient Gender: F            HR:           88 bpm. Exam Location:  Inpatient Procedure: 2D Echo, Cardiac Doppler and Color Doppler Indications:    Mitral Valve Disorder I05.9  History:        Patient has no prior history of Echocardiogram examinations.                 COPD; Risk Factors:Current Smoker and Hypertension.  Sonographer:    Eulah Pont RDCS Referring Phys: [id number] ERIC CHEN IMPRESSIONS  1. Left ventricular ejection fraction, by estimation, is 55%. The left ventricle has normal function. The left ventricle has no regional wall motion abnormalities. Left ventricular diastolic parameters are consistent with Grade I diastolic dysfunction (impaired relaxation).  2. Right ventricular systolic function is normal. The right ventricular size is normal. There is normal pulmonary artery systolic pressure. The estimated right ventricular systolic pressure is 21.0 mmHg.  3. The mitral valve is normal in structure. Trivial mitral valve regurgitation. No evidence of mitral stenosis.  4. The aortic valve is tricuspid. There is mild calcification of the aortic valve. Aortic  valve regurgitation is not visualized. No aortic stenosis is present.  5. The inferior vena cava is normal in size with greater than 50% respiratory variability, suggesting right atrial pressure of 3 mmHg. FINDINGS  Left Ventricle: Left ventricular ejection fraction, by estimation, is 55%. The left ventricle has normal function. The left ventricle has no regional wall motion abnormalities. The left ventricular internal cavity size was normal in size. There is no left ventricular hypertrophy. Left ventricular diastolic parameters are consistent with Grade I diastolic dysfunction (impaired relaxation). Right Ventricle: The right ventricular size is normal. No increase in right ventricular wall thickness. Right ventricular systolic function is normal. There is normal pulmonary artery systolic pressure. The tricuspid regurgitant velocity is 2.12 m/s, and  with an assumed right atrial pressure of 3 mmHg, the estimated right ventricular systolic pressure is 21.0 mmHg. Left Atrium: Left atrial size was normal in size. Right Atrium: Right atrial size was normal in size. Pericardium: There is no evidence of pericardial effusion. Mitral Valve: The mitral valve is normal in structure. Trivial mitral valve regurgitation. No evidence of mitral valve stenosis. MV peak gradient, 6.9 mmHg. The mean mitral valve gradient is 3.0 mmHg. Tricuspid Valve: The tricuspid valve is normal in structure. Tricuspid valve regurgitation is trivial. Aortic Valve: The aortic valve is tricuspid. There is mild calcification of the aortic valve. Aortic valve regurgitation is not visualized. No aortic stenosis is present. Pulmonic Valve: The pulmonic valve was normal in structure. Pulmonic valve regurgitation is not visualized. Aorta: The aortic root is normal in size and structure. Venous: The inferior vena cava is normal in size with greater than 50% respiratory variability, suggesting right atrial pressure of 3 mmHg. IAS/Shunts: No atrial level  shunt detected by color flow Doppler.  LEFT VENTRICLE PLAX 2D LVIDd:         4.00 cm     Diastology LVIDs:         2.90 cm     LV e' medial:    6.48 cm/s LV PW:  1.00 cm     LV E/e' medial:  14.4 LV IVS:        1.00 cm     LV e' lateral:   7.27 cm/s LVOT diam:     1.90 cm     LV E/e' lateral: 12.8 LV SV:         69 LV SV Index:   51 LVOT Area:     2.84 cm  LV Volumes (MOD) LV vol d, MOD A2C: 64.9 ml LV vol d, MOD A4C: 56.6 ml LV vol s, MOD A2C: 22.8 ml LV vol s, MOD A4C: 21.7 ml LV SV MOD A2C:     42.1 ml LV SV MOD A4C:     56.6 ml LV SV MOD BP:      37.9 ml RIGHT VENTRICLE RV S prime:     10.10 cm/s TAPSE (M-mode): 1.9 cm LEFT ATRIUM             Index        RIGHT ATRIUM           Index LA diam:        2.10 cm 1.55 cm/m   RA Area:     11.70 cm LA Vol (A2C):   26.9 ml 19.87 ml/m  RA Volume:   26.50 ml  19.57 ml/m LA Vol (A4C):   18.9 ml 13.96 ml/m LA Biplane Vol: 23.4 ml 17.28 ml/m  AORTIC VALVE LVOT Vmax:   129.00 cm/s LVOT Vmean:  83.900 cm/s LVOT VTI:    0.242 m  AORTA Ao Root diam: 2.90 cm Ao Asc diam:  3.00 cm MITRAL VALVE                TRICUSPID VALVE MV Area (PHT): 4.60 cm     TR Peak grad:   18.0 mmHg MV Area VTI:   1.88 cm     TR Vmax:        212.00 cm/s MV Peak grad:  6.9 mmHg MV Mean grad:  3.0 mmHg     SHUNTS MV Vmax:       1.31 m/s     Systemic VTI:  0.24 m MV Vmean:      73.5 cm/s    Systemic Diam: 1.90 cm MV Decel Time: 165 msec MV E velocity: 93.40 cm/s MV A velocity: 132.00 cm/s MV E/A ratio:  0.71 Dalton McleanMD Electronically signed by Franki Monte Signature Date/Time: 03/20/2022/5:16:36 PM    Final    CT Abdomen Pelvis W Contrast  Result Date: 03/07/2022 CLINICAL DATA:  Abdominal pain, acute, nonlocalized EXAM: CT ABDOMEN AND PELVIS WITH CONTRAST TECHNIQUE: Multidetector CT imaging of the abdomen and pelvis was performed using the standard protocol following bolus administration of intravenous contrast. RADIATION DOSE REDUCTION: This exam was performed according to the  departmental dose-optimization program which includes automated exposure control, adjustment of the mA and/or kV according to patient size and/or use of iterative reconstruction technique. CONTRAST:  110mL OMNIPAQUE IOHEXOL 350 MG/ML SOLN COMPARISON:  10/28/2018 FINDINGS: Lower chest: See chest CT report. Hepatobiliary: No focal hepatic abnormality. Gallbladder unremarkable. Pancreas: No focal abnormality or ductal dilatation. Spleen: No focal abnormality.  Normal size. Adrenals/Urinary Tract: Small cyst in the midpole of the right kidney, stable. No stones or hydronephrosis. Adrenal glands and urinary bladder unremarkable. Stomach/Bowel: Stomach, large and small bowel grossly unremarkable. Vascular/Lymphatic: Heavily calcified aorta and iliac vessels. No evidence of aneurysm or adenopathy. Reproductive: Prior hysterectomy.  No adnexal masses. Other: No free fluid or free  air. Musculoskeletal: No acute bony abnormality. Chronic moderate compression fracture at L2. IMPRESSION: No acute findings in the abdomen or pelvis. Heavily calcified aorta and iliac vessels. Electronically Signed   By: Rolm Baptise M.D.   On: 03/07/2022 23:30   CT Angio Chest PE W/Cm &/Or Wo Cm  Result Date: 03/07/2022 CLINICAL DATA:  Pulmonary embolism (PE) suspected, high prob EXAM: CT ANGIOGRAPHY CHEST WITH CONTRAST TECHNIQUE: Multidetector CT imaging of the chest was performed using the standard protocol during bolus administration of intravenous contrast. Multiplanar CT image reconstructions and MIPs were obtained to evaluate the vascular anatomy. RADIATION DOSE REDUCTION: This exam was performed according to the departmental dose-optimization program which includes automated exposure control, adjustment of the mA and/or kV according to patient size and/or use of iterative reconstruction technique. CONTRAST:  151mL OMNIPAQUE IOHEXOL 350 MG/ML SOLN COMPARISON:  08/28/2015 FINDINGS: Cardiovascular: No filling defects in the pulmonary  arteries to suggest pulmonary emboli. Heart is normal size. Aorta is normal caliber. Coronary artery and aortic calcifications. Mediastinum/Nodes: No mediastinal, hilar, or axillary adenopathy. Trachea and esophagus are unremarkable. Thyroid unremarkable. Lungs/Pleura: Lungs are clear. No focal airspace opacities or suspicious nodules. No effusions. Upper Abdomen: Imaging into the upper abdomen demonstrates no acute findings. Musculoskeletal: Chest wall soft tissues are unremarkable. Severe compression fractures at T8 and T9. Slight compression fractures through the superior endplates of 624THL through L1. Moderate compression fracture at L2 which is stable since 2020. The remainder of the compression fractures are age indeterminate, new since 2016. Review of the MIP images confirms the above findings. IMPRESSION: No evidence of pulmonary embolus. Coronary artery disease. Severe compression fractures at T8 and T9, early compression fractures T11 through L1. These are age indeterminate. Moderate compression fracture at L2, stable since 2020. Aortic Atherosclerosis (ICD10-I70.0). Electronically Signed   By: Rolm Baptise M.D.   On: 03/07/2022 23:28   DG Chest Port 1 View  Result Date: 03/07/2022 CLINICAL DATA:  Chest pain with shortness of breath. EXAM: PORTABLE CHEST 1 VIEW COMPARISON:  Chest x-ray 02/23/2018 FINDINGS: The heart size and mediastinal contours are within normal limits. Both lungs are clear. The visualized skeletal structures are unremarkable. IMPRESSION: No active disease. Electronically Signed   By: Ronney Asters M.D.   On: 03/07/2022 20:42    Microbiology: Results for orders placed or performed during the hospital encounter of 10/28/18  Urine culture     Status: Abnormal   Collection Time: 10/29/18  2:50 AM   Specimen: Urine, Random  Result Value Ref Range Status   Specimen Description   Final    URINE, RANDOM Performed at [facility] [address] [place],  [place] [address]    Special Requests   Final    NONE Performed at [facility], [place] [address] [place], [place] [address]    Culture MULTIPLE SPECIES PRESENT, SUGGEST RECOLLECTION (A)  Final   Report Status 10/30/2018 FINAL  Final    Labs: CBC: Recent Labs  Lab 03/25/22 0456 03/26/22 0441 03/27/22 0407 03/29/22 0429 03/30/22 0403  WBC 6.3 5.4 4.8 5.3 5.6  HGB 7.0* 7.6* 7.1* 7.0* 7.1*  HCT 25.0* 27.0* 25.1* 25.2* 25.4*  MCV 80.1 81.3 80.4 82.1 83.6  PLT 193 200 202 249 [id number]   Basic Metabolic Panel: Recent Labs  Lab 03/24/22 0421 03/26/22 0441 03/27/22 0407  NA 141 139 141  K 3.8 3.7 3.5  CL 104 102 102  CO2 30 29 29   GLUCOSE 88 86 90  BUN 17  10 14  CREATININE 0.99 0.89 0.92  CALCIUM 8.9 8.7* 8.7*  MG  --  1.9 1.9   Liver Function Tests: Recent Labs  Lab 03/24/22 0421  AST 21  ALT 13  ALKPHOS 73  BILITOT 0.6  PROT 6.2*  ALBUMIN 3.0*   CBG: No results for input(s): "GLUCAP" in the last 168 hours.  Discharge time spent: greater than 30 minutes.  Signed: Berle Mull, MD [facility]

## 2022-04-28 ENCOUNTER — Other Ambulatory Visit: Payer: Self-pay | Admitting: Family Medicine

## 2022-04-28 DIAGNOSIS — M810 Age-related osteoporosis without current pathological fracture: Secondary | ICD-10-CM

## 2022-06-24 ENCOUNTER — Inpatient Hospital Stay (HOSPITAL_COMMUNITY)
Admission: EM | Admit: 2022-06-24 | Discharge: 2022-06-26 | DRG: 291 | Disposition: A | Discharge status: Home Health | Payer: Medicare HMO | Attending: Internal Medicine | Admitting: Internal Medicine

## 2022-06-24 ENCOUNTER — Other Ambulatory Visit: Payer: Self-pay

## 2022-06-24 ENCOUNTER — Emergency Department (HOSPITAL_COMMUNITY): Payer: Medicare HMO

## 2022-06-24 ENCOUNTER — Encounter (HOSPITAL_COMMUNITY): Payer: Self-pay

## 2022-06-24 DIAGNOSIS — Z66 Do not resuscitate: Secondary | ICD-10-CM | POA: Diagnosis present

## 2022-06-24 DIAGNOSIS — R072 Precordial pain: Secondary | ICD-10-CM

## 2022-06-24 DIAGNOSIS — E876 Hypokalemia: Secondary | ICD-10-CM | POA: Diagnosis present

## 2022-06-24 DIAGNOSIS — F17200 Nicotine dependence, unspecified, uncomplicated: Secondary | ICD-10-CM

## 2022-06-24 DIAGNOSIS — Z20822 Contact with and (suspected) exposure to covid-19: Secondary | ICD-10-CM | POA: Diagnosis present

## 2022-06-24 DIAGNOSIS — F17210 Nicotine dependence, cigarettes, uncomplicated: Secondary | ICD-10-CM | POA: Diagnosis present

## 2022-06-24 DIAGNOSIS — I5043 Acute on chronic combined systolic (congestive) and diastolic (congestive) heart failure: Secondary | ICD-10-CM

## 2022-06-24 DIAGNOSIS — I10 Essential (primary) hypertension: Secondary | ICD-10-CM

## 2022-06-24 DIAGNOSIS — Z7951 Long term (current) use of inhaled steroids: Secondary | ICD-10-CM

## 2022-06-24 DIAGNOSIS — G8929 Other chronic pain: Secondary | ICD-10-CM | POA: Diagnosis present

## 2022-06-24 DIAGNOSIS — J9621 Acute and chronic respiratory failure with hypoxia: Secondary | ICD-10-CM | POA: Diagnosis present

## 2022-06-24 DIAGNOSIS — Z720 Tobacco use: Secondary | ICD-10-CM | POA: Diagnosis present

## 2022-06-24 DIAGNOSIS — F419 Anxiety disorder, unspecified: Secondary | ICD-10-CM | POA: Diagnosis present

## 2022-06-24 DIAGNOSIS — I130 Hypertensive heart and chronic kidney disease with heart failure and stage 1 through stage 4 chronic kidney disease, or unspecified chronic kidney disease: Principal | ICD-10-CM | POA: Diagnosis present

## 2022-06-24 DIAGNOSIS — R7989 Other specified abnormal findings of blood chemistry: Secondary | ICD-10-CM | POA: Diagnosis present

## 2022-06-24 DIAGNOSIS — R778 Other specified abnormalities of plasma proteins: Secondary | ICD-10-CM | POA: Diagnosis present

## 2022-06-24 DIAGNOSIS — M4850XA Collapsed vertebra, not elsewhere classified, site unspecified, initial encounter for fracture: Secondary | ICD-10-CM | POA: Diagnosis present

## 2022-06-24 DIAGNOSIS — N1831 Chronic kidney disease, stage 3a: Secondary | ICD-10-CM | POA: Diagnosis present

## 2022-06-24 DIAGNOSIS — J449 Chronic obstructive pulmonary disease, unspecified: Secondary | ICD-10-CM | POA: Diagnosis present

## 2022-06-24 DIAGNOSIS — J441 Chronic obstructive pulmonary disease with (acute) exacerbation: Secondary | ICD-10-CM | POA: Diagnosis present

## 2022-06-24 DIAGNOSIS — J9601 Acute respiratory failure with hypoxia: Secondary | ICD-10-CM | POA: Diagnosis present

## 2022-06-24 DIAGNOSIS — Z887 Allergy status to serum and vaccine status: Secondary | ICD-10-CM

## 2022-06-24 DIAGNOSIS — Z888 Allergy status to other drugs, medicaments and biological substances status: Secondary | ICD-10-CM

## 2022-06-24 DIAGNOSIS — D649 Anemia, unspecified: Secondary | ICD-10-CM | POA: Diagnosis present

## 2022-06-24 DIAGNOSIS — G4700 Insomnia, unspecified: Secondary | ICD-10-CM | POA: Diagnosis present

## 2022-06-24 DIAGNOSIS — I5033 Acute on chronic diastolic (congestive) heart failure: Secondary | ICD-10-CM | POA: Diagnosis present

## 2022-06-24 DIAGNOSIS — Z79899 Other long term (current) drug therapy: Secondary | ICD-10-CM

## 2022-06-24 DIAGNOSIS — M8008XA Age-related osteoporosis with current pathological fracture, vertebra(e), initial encounter for fracture: Secondary | ICD-10-CM | POA: Diagnosis present

## 2022-06-24 DIAGNOSIS — I252 Old myocardial infarction: Secondary | ICD-10-CM

## 2022-06-24 DIAGNOSIS — I5032 Chronic diastolic (congestive) heart failure: Secondary | ICD-10-CM | POA: Diagnosis present

## 2022-06-24 DIAGNOSIS — Z7982 Long term (current) use of aspirin: Secondary | ICD-10-CM

## 2022-06-24 LAB — CBC
HCT: 34.1 % — ABNORMAL LOW (ref 36.0–46.0)
Hemoglobin: 10.6 g/dL — ABNORMAL LOW (ref 12.0–15.0)
MCH: 27.6 pg (ref 26.0–34.0)
MCHC: 31.1 g/dL (ref 30.0–36.0)
MCV: 88.8 fL (ref 80.0–100.0)
Platelets: 302 10*3/uL (ref 150–400)
RBC: 3.84 MIL/uL — ABNORMAL LOW (ref 3.87–5.11)
RDW: 14 % (ref 11.5–15.5)
WBC: 9.9 10*3/uL (ref 4.0–10.5)
nRBC: 0 % (ref 0.0–0.2)

## 2022-06-24 LAB — BASIC METABOLIC PANEL
Anion gap: 10 (ref 5–15)
BUN: 13 mg/dL (ref 8–23)
CO2: 29 mmol/L (ref 22–32)
Calcium: 9 mg/dL (ref 8.9–10.3)
Chloride: 102 mmol/L (ref 98–111)
Creatinine, Ser: 0.9 mg/dL (ref 0.44–1.00)
GFR, Estimated: >60 mL/min (ref >60)
Glucose, Bld: 125 mg/dL — ABNORMAL HIGH (ref 70–99)
Potassium: 3 mmol/L — ABNORMAL LOW (ref 3.5–5.1)
Sodium: 141 mmol/L (ref 135–145)

## 2022-06-24 LAB — D-DIMER, QUANTITATIVE: D-Dimer, Quant: 0.44 ug/mL-FEU (ref 0.00–0.50)

## 2022-06-24 LAB — BRAIN NATRIURETIC PEPTIDE: B Natriuretic Peptide: 430.3 pg/mL — ABNORMAL HIGH (ref 0.0–100.0)

## 2022-06-24 LAB — TROPONIN I (HIGH SENSITIVITY): Troponin I (High Sensitivity): 26 ng/L — ABNORMAL HIGH (ref <18)

## 2022-06-24 LAB — SARS CORONAVIRUS 2 BY RT PCR: SARS Coronavirus 2 by RT PCR: NEGATIVE

## 2022-06-24 MED ORDER — POTASSIUM CHLORIDE 20 MEQ PO PACK
40.0000 meq | PACK | Freq: Once | ORAL | Status: AC
  Administered 2022-06-24: 40 meq via ORAL
  Filled 2022-06-24: qty 2

## 2022-06-24 MED ORDER — IPRATROPIUM BROMIDE 0.02 % IN SOLN
0.5000 mg | Freq: Once | RESPIRATORY_TRACT | Status: AC
  Administered 2022-06-24: 0.5 mg via RESPIRATORY_TRACT
  Filled 2022-06-24: qty 2.5

## 2022-06-24 MED ORDER — ACETAMINOPHEN 500 MG PO TABS
1000.0000 mg | ORAL_TABLET | Freq: Once | ORAL | Status: AC
  Administered 2022-06-24: 1000 mg via ORAL
  Filled 2022-06-24: qty 2

## 2022-06-24 MED ORDER — METHYLPREDNISOLONE SODIUM SUCC 125 MG IJ SOLR
80.0000 mg | Freq: Once | INTRAMUSCULAR | Status: AC
  Administered 2022-06-24: 80 mg via INTRAVENOUS
  Filled 2022-06-24: qty 2

## 2022-06-24 MED ORDER — ALBUTEROL SULFATE (2.5 MG/3ML) 0.083% IN NEBU
5.0000 mg | INHALATION_SOLUTION | Freq: Once | RESPIRATORY_TRACT | Status: AC
  Administered 2022-06-24: 5 mg via RESPIRATORY_TRACT
  Filled 2022-06-24: qty 6

## 2022-06-24 MED ORDER — ALUM & MAG HYDROXIDE-SIMETH 200-200-20 MG/5ML PO SUSP
30.0000 mL | Freq: Once | ORAL | Status: AC
  Administered 2022-06-24: 30 mL via ORAL
  Filled 2022-06-24: qty 30

## 2022-06-24 MED ORDER — FAMOTIDINE 20 MG PO TABS
20.0000 mg | ORAL_TABLET | Freq: Once | ORAL | Status: AC
  Administered 2022-06-24: 20 mg via ORAL
  Filled 2022-06-24: qty 1

## 2022-06-24 MED ORDER — FUROSEMIDE 10 MG/ML IJ SOLN
20.0000 mg | Freq: Once | INTRAMUSCULAR | Status: AC
  Administered 2022-06-24: 20 mg via INTRAVENOUS
  Filled 2022-06-24: qty 2

## 2022-06-24 NOTE — Discharge Instructions (Addendum)
It was our pleasure to provide your ER care today - we hope that you feel better.  Use albuterol treatment as need. Take prednisone as prescribed.  Avoid any smoking.   Follow up closely with primary care doctor in the coming week.  For chest discomfort, follow up closely with cardiologist in the coming week.   Your potassium level is low (3) - eat plenty of fruits and vegetables, take potassium supplement as prescribed, and follow up with your doctor in one week.   Return to ER if worse, new symptoms, fevers, recurrent/persistent chest pain, increased trouble breathing, or other concern.

## 2022-06-24 NOTE — ED Triage Notes (Signed)
Pt arrives by [facility] from home. Pt having "extremely heavy breathing" with high blood pressure.   Pt has not taken her lasix for the past week because she was afraid her potassium was low.

## 2022-06-24 NOTE — ED Provider Notes (Signed)
[facility] EMERGENCY DEPARTMENT Provider Note   CSN: [id number] Arrival date & time: 06/24/22  2110     History  Chief Complaint  Patient presents with   Chest Pain    Julia Hernandez is a 86 y.o. female.  Pt with hx copd, cad, c/o increased sob in past day. Symptoms acute onset, moderate, persistent. Had dull pain to left lower chest in past day as well. Denies new or worsening cough. No fever or chills. Symptoms at rest, constant. Denies exertional chest pain or discomfort. No pleuritic pain. +smoker. Denies leg pain or swelling. No recent surgery or immobility. No abd pain or nv.  Recently had held her lasix in past week as did not want her potassium to get low.   The history is provided by the patient, a relative and medical records.  Chest Pain Associated symptoms: shortness of breath   Associated symptoms: no abdominal pain, no back pain, no cough, no fever, no headache, no nausea, no palpitations and no vomiting        Home Medications Prior to Admission medications   Medication Sig Start Date End Date Taking? Authorizing Provider  acetaminophen (TYLENOL) 500 MG tablet Take 500 mg by mouth 5 (five) times daily.    [provider]  acyclovir ointment (ZOVIRAX) 5 % Apply 1 application topically 3 (three) times daily as needed (outbreaks).    [provider]  aspirin EC 81 MG tablet Take 1 tablet (81 mg total) by mouth daily. Swallow whole. 03/31/22   Rolly Salter, MD  atorvastatin (LIPITOR) 40 MG tablet Take 40 mg by mouth daily. 03/02/22   [provider]  B Complex-C (B-COMPLEX WITH VITAMIN C) tablet Take 1 tablet by mouth daily. 03/31/22   Rolly Salter, MD  diazepam (VALIUM) 5 MG tablet Take 1 tablet (5 mg total) by mouth daily as needed for anxiety. 03/30/22   Rolly Salter, MD  docusate sodium (COLACE) 100 MG capsule Take 1 capsule (100 mg total) by mouth 2 (two) times daily. 03/30/22 03/30/23  Rolly Salter, MD  ferrous  sulfate 325 (65 FE) MG tablet Take 1 tablet (325 mg total) by mouth 2 (two) times daily with a meal. 03/30/22   Rolly Salter, MD  furosemide (LASIX) 20 MG tablet Take 1 tablet (20 mg total) by mouth daily as needed (weight gain of >3 lbs in 1 day or >5 lbs in 1 week). 03/30/22   Rolly Salter, MD  Ipratropium-Albuterol (COMBIVENT RESPIMAT) 20-100 MCG/ACT AERS respimat Inhale 1 puff into the lungs 4 (four) times daily as needed for shortness of breath.    [provider]  lactose free nutrition (BOOST PLUS) LIQD Take 237 mLs by mouth 3 (three) times daily with meals. 03/30/22   Rolly Salter, MD  magic mouthwash w/lidocaine SOLN Take 5 mLs by mouth 4 (four) times daily as needed for mouth pain.    [provider]  melatonin 10 MG TABS Take 5 mg by mouth at bedtime as needed. 03/30/22   Rolly Salter, MD  metoprolol succinate (TOPROL-XL) 25 MG 24 hr tablet Take 12.5 mg by mouth at bedtime. 03/02/22   [provider]  oxyCODONE (OXY IR/ROXICODONE) 5 MG immediate release tablet Take 0.5 tablets (2.5 mg total) by mouth every 4 (four) hours as needed for moderate pain. 03/30/22   Rolly Salter, MD  pantoprazole (PROTONIX) 40 MG tablet Take 1 tablet (40 mg total) by mouth daily. 03/31/22  Rolly Salter, MD  polyethylene glycol (MIRALAX / GLYCOLAX) 17 g packet Take 17 g by mouth daily as needed for mild constipation. 03/30/22   Rolly Salter, MD  sertraline (ZOLOFT) 100 MG tablet Take 100 mg by mouth at bedtime.    [provider]  SYMBICORT 160-4.5 MCG/ACT inhaler Inhale 2 puffs into the lungs 2 (two) times daily. 03/02/22   [provider]      Allergies    Influenza vac split quad, Ditropan xl [oxybutynin], and Simvastatin    Review of Systems   Review of Systems  Constitutional:  Negative for chills and fever.  HENT:  Negative for sore throat.   Eyes:  Negative for redness.  Respiratory:  Positive for shortness of breath. Negative for cough.    Cardiovascular:  Positive for chest pain. Negative for palpitations and leg swelling.  Gastrointestinal:  Negative for abdominal pain, nausea and vomiting.  Genitourinary:  Negative for flank pain.  Musculoskeletal:  Negative for back pain and neck pain.  Skin:  Negative for rash.  Neurological:  Negative for headaches.  Hematological:  Does not bruise/bleed easily.  Psychiatric/Behavioral:  Negative for confusion.     Physical Exam Updated Vital Signs BP (!) 172/85 (BP Location: Left Arm)   Pulse (!) 102   Temp 98.5 F (36.9 C) (Oral)   Resp (!) 26   Ht 1.448 m (4\' 9" )   Wt 45.4 kg   SpO2 (S) (!) 87%   BMI 21.64 kg/m  Physical Exam Vitals and nursing note reviewed.  Constitutional:      Appearance: Normal appearance. She is well-developed.  HENT:     Head: Atraumatic.     Nose: Nose normal.     Mouth/Throat:     Mouth: Mucous membranes are moist.  Eyes:     General: No scleral icterus.    Conjunctiva/sclera: Conjunctivae normal.  Neck:     Trachea: No tracheal deviation.  Cardiovascular:     Rate and Rhythm: Regular rhythm. Tachycardia present.     Pulses: Normal pulses.     Heart sounds: Normal heart sounds. No murmur heard.    No friction rub. No gallop.  Pulmonary:     Effort: Pulmonary effort is normal. No respiratory distress.     Breath sounds: Wheezing present.  Abdominal:     General: Bowel sounds are normal. There is no distension.     Palpations: Abdomen is soft. There is no mass.     Tenderness: There is no abdominal tenderness.  Genitourinary:    Comments: No cva tenderness.  Musculoskeletal:        General: No swelling or tenderness.     Cervical back: Normal range of motion and neck supple. No rigidity. No muscular tenderness.     Right lower leg: No edema.     Left lower leg: No edema.  Skin:    General: Skin is warm and dry.     Findings: No rash.  Neurological:     Mental Status: She is alert.     Comments: Alert, speech normal.    Psychiatric:        Mood and Affect: Mood normal.     ED Results / Procedures / Treatments   Labs (all labs ordered are listed, but only abnormal results are displayed) Results for orders placed or performed during the hospital encounter of 06/24/22  CBC  Result Value Ref Range   WBC 9.9 4.0 - 10.5 K/uL   RBC 3.84 (L)  3.87 - 5.11 MIL/uL   Hemoglobin 10.6 (L) 12.0 - 15.0 g/dL   HCT 30.8 (L) 65.7 - 84.6 %   MCV 88.8 80.0 - 100.0 fL   MCH 27.6 26.0 - 34.0 pg   MCHC 31.1 30.0 - 36.0 g/dL   RDW 96.2 95.2 - 84.1 %   Platelets 302 150 - 400 K/uL   nRBC 0.0 0.0 - 0.2 %  Basic metabolic panel  Result Value Ref Range   Sodium 141 135 - 145 mmol/L   Potassium 3.0 (L) 3.5 - 5.1 mmol/L   Chloride 102 98 - 111 mmol/L   CO2 29 22 - 32 mmol/L   Glucose, Bld 125 (H) 70 - 99 mg/dL   BUN 13 8 - 23 mg/dL   Creatinine, Ser 3.24 0.44 - 1.00 mg/dL   Calcium 9.0 8.9 - 40.1 mg/dL   GFR, Estimated >02 >72 mL/min   Anion gap 10 5 - 15  D-dimer, quantitative  Result Value Ref Range   D-Dimer, Quant 0.44 0.00 - 0.50 ug/mL-FEU  Troponin I (High Sensitivity)  Result Value Ref Range   Troponin I (High Sensitivity) 26 (H) <18 ng/L      EKG EKG Interpretation  Date/Time:  Wednesday June 24 2022 21:16:59 EDT Ventricular Rate:  99 PR Interval:  129 QRS Duration: 118 QT Interval:  383 QTC Calculation: 492 R Axis:   245 Text Interpretation: Sinus rhythm Incomplete right bundle branch block Non-specific ST-t changes Confirmed by Cathren Laine ([id number]) on 06/24/2022 9:26:27 PM  Radiology No results found.  Procedures Procedures    Medications Ordered in ED Medications  methylPREDNISolone sodium succinate (SOLU-MEDROL) 125 mg/2 mL injection 80 mg (has no administration in time range)  albuterol (PROVENTIL) (2.5 MG/3ML) 0.083% nebulizer solution 5 mg (has no administration in time range)  ipratropium (ATROVENT) nebulizer solution 0.5 mg (has no administration in time range)    ED  Course/ Medical Decision Making/ A&P                           Medical Decision Making Problems Addressed: Acute on chronic combined systolic and diastolic CHF (congestive heart failure) (HCC): acute illness or injury with systemic symptoms that poses a threat to life or bodily functions Acute on chronic respiratory failure with hypoxia (HCC): acute illness or injury with systemic symptoms that poses a threat to life or bodily functions COPD exacerbation (HCC): acute illness or injury with systemic symptoms that poses a threat to life or bodily functions Essential hypertension: chronic illness or injury with exacerbation, progression, or side effects of treatment that poses a threat to life or bodily functions Hypokalemia: acute illness or injury Precordial chest pain: acute illness or injury with systemic symptoms that poses a threat to life or bodily functions  Amount and/or Complexity of Data Reviewed Independent Historian: EMS    Details: Ems/family, hx External Data Reviewed: notes. Labs: ordered. Decision-making details documented in ED Course. Radiology: ordered and independent interpretation performed. Decision-making details documented in ED Course. ECG/medicine tests: ordered and independent interpretation performed. Decision-making details documented in ED Course.  Risk OTC drugs. Prescription drug management. Decision regarding hospitalization.   Iv ns. Continuous pulse ox and cardiac monitoring. Labs ordered/sent. Imaging ordered.   Diff dx includes copd exacerbation, pna, acs, etc - dispo decision including potential need for admission considered - will give meds/tx, check labs and imaging and reassess.   Reviewed nursing notes and prior charts for additional history. External reports  reviewed. Additional history from: family, ems.   Cardiac monitor: sinus rhythm, rate 104.   Labs reviewed/interpreted by me - trop 26, c/w prior. K low, kcl po. Wbc normal.  Ddimer  normal. Bnp elevated, pt had been holding lasix, lasix iv.   Xrays reviewed/interpreted by me - no pna.   Albuterol and atrovent tx. Solumedrol iv. Pepcid po, acetaminophen po, maalox po.   Recheck, delta trop pending.   Wheezing improved. No cp. Room air pulse ox remains 87 %.    Given hypoxia, copd exacerbation, will admit.   Hospitalists consulted for admission.   CRITICAL CARE RE: acute on chronic resp failure w hypoxia, copd exacerbation, chest pain.  Performed by: Suzi Roots Total critical care time: 45 minutes Critical care time was exclusive of separately billable procedures and treating other patients. Critical care was necessary to treat or prevent imminent or life-threatening deterioration. Critical care was time spent personally by me on the following activities: development of treatment plan with patient and/or surrogate as well as nursing, discussions with consultants, evaluation of patient's response to treatment, examination of patient, obtaining history from patient or surrogate, ordering and performing treatments and interventions, ordering and review of laboratory studies, ordering and review of radiographic studies, pulse oximetry and re-evaluation of patient's condition.            Final Clinical Impression(s) / ED Diagnoses Final diagnoses:  None    Rx / DC Orders ED Discharge Orders     None         Cathren Laine, MD 06/25/22 [phone]

## 2022-06-25 DIAGNOSIS — Z66 Do not resuscitate: Secondary | ICD-10-CM | POA: Diagnosis present

## 2022-06-25 DIAGNOSIS — E876 Hypokalemia: Secondary | ICD-10-CM | POA: Diagnosis present

## 2022-06-25 DIAGNOSIS — I5032 Chronic diastolic (congestive) heart failure: Secondary | ICD-10-CM | POA: Diagnosis present

## 2022-06-25 DIAGNOSIS — I5033 Acute on chronic diastolic (congestive) heart failure: Secondary | ICD-10-CM | POA: Diagnosis present

## 2022-06-25 DIAGNOSIS — J441 Chronic obstructive pulmonary disease with (acute) exacerbation: Secondary | ICD-10-CM

## 2022-06-25 DIAGNOSIS — I252 Old myocardial infarction: Secondary | ICD-10-CM | POA: Diagnosis not present

## 2022-06-25 DIAGNOSIS — Z887 Allergy status to serum and vaccine status: Secondary | ICD-10-CM | POA: Diagnosis not present

## 2022-06-25 DIAGNOSIS — G8929 Other chronic pain: Secondary | ICD-10-CM | POA: Diagnosis present

## 2022-06-25 DIAGNOSIS — Z7951 Long term (current) use of inhaled steroids: Secondary | ICD-10-CM | POA: Diagnosis not present

## 2022-06-25 DIAGNOSIS — F419 Anxiety disorder, unspecified: Secondary | ICD-10-CM | POA: Diagnosis present

## 2022-06-25 DIAGNOSIS — M8008XA Age-related osteoporosis with current pathological fracture, vertebra(e), initial encounter for fracture: Secondary | ICD-10-CM | POA: Diagnosis present

## 2022-06-25 DIAGNOSIS — R7989 Other specified abnormal findings of blood chemistry: Secondary | ICD-10-CM | POA: Diagnosis present

## 2022-06-25 DIAGNOSIS — Z7982 Long term (current) use of aspirin: Secondary | ICD-10-CM | POA: Diagnosis not present

## 2022-06-25 DIAGNOSIS — J9601 Acute respiratory failure with hypoxia: Secondary | ICD-10-CM | POA: Diagnosis present

## 2022-06-25 DIAGNOSIS — D649 Anemia, unspecified: Secondary | ICD-10-CM

## 2022-06-25 DIAGNOSIS — N1831 Chronic kidney disease, stage 3a: Secondary | ICD-10-CM | POA: Diagnosis present

## 2022-06-25 DIAGNOSIS — M4850XA Collapsed vertebra, not elsewhere classified, site unspecified, initial encounter for fracture: Secondary | ICD-10-CM | POA: Diagnosis present

## 2022-06-25 DIAGNOSIS — Z888 Allergy status to other drugs, medicaments and biological substances status: Secondary | ICD-10-CM | POA: Diagnosis not present

## 2022-06-25 DIAGNOSIS — R778 Other specified abnormalities of plasma proteins: Secondary | ICD-10-CM | POA: Diagnosis present

## 2022-06-25 DIAGNOSIS — Z20822 Contact with and (suspected) exposure to covid-19: Secondary | ICD-10-CM | POA: Diagnosis present

## 2022-06-25 DIAGNOSIS — G4700 Insomnia, unspecified: Secondary | ICD-10-CM | POA: Diagnosis present

## 2022-06-25 DIAGNOSIS — F17210 Nicotine dependence, cigarettes, uncomplicated: Secondary | ICD-10-CM | POA: Diagnosis present

## 2022-06-25 DIAGNOSIS — I130 Hypertensive heart and chronic kidney disease with heart failure and stage 1 through stage 4 chronic kidney disease, or unspecified chronic kidney disease: Secondary | ICD-10-CM | POA: Diagnosis present

## 2022-06-25 DIAGNOSIS — S22000D Wedge compression fracture of unspecified thoracic vertebra, subsequent encounter for fracture with routine healing: Secondary | ICD-10-CM

## 2022-06-25 DIAGNOSIS — Z720 Tobacco use: Secondary | ICD-10-CM

## 2022-06-25 DIAGNOSIS — J9621 Acute and chronic respiratory failure with hypoxia: Secondary | ICD-10-CM | POA: Diagnosis present

## 2022-06-25 DIAGNOSIS — Z79899 Other long term (current) drug therapy: Secondary | ICD-10-CM | POA: Diagnosis not present

## 2022-06-25 LAB — TROPONIN I (HIGH SENSITIVITY): Troponin I (High Sensitivity): 29 ng/L — ABNORMAL HIGH (ref <18)

## 2022-06-25 MED ORDER — MAGIC MOUTHWASH W/LIDOCAINE: 5.0000 mL | Freq: Four times a day (QID) | ORAL | Status: DC | PRN

## 2022-06-25 MED ORDER — SODIUM CHLORIDE 0.9% FLUSH
3.0000 mL | Freq: Two times a day (BID) | INTRAVENOUS | Status: DC
  Administered 2022-06-25 – 2022-06-26 (×4): 3 mL via INTRAVENOUS

## 2022-06-25 MED ORDER — FUROSEMIDE 10 MG/ML IJ SOLN
20.0000 mg | Freq: Two times a day (BID) | INTRAMUSCULAR | Status: DC
  Administered 2022-06-25 – 2022-06-26 (×3): 20 mg via INTRAVENOUS
  Filled 2022-06-25 (×3): qty 2

## 2022-06-25 MED ORDER — IPRATROPIUM-ALBUTEROL 0.5-2.5 (3) MG/3ML IN SOLN
3.0000 mL | Freq: Two times a day (BID) | RESPIRATORY_TRACT | Status: DC
  Administered 2022-06-25: 3 mL via RESPIRATORY_TRACT
  Filled 2022-06-25: qty 3

## 2022-06-25 MED ORDER — OXYCODONE HCL 5 MG PO TABS: 5.0000 mg | ORAL_TABLET | ORAL | Status: DC | PRN

## 2022-06-25 MED ORDER — ONDANSETRON HCL 4 MG/2ML IJ SOLN: 4.0000 mg | Freq: Four times a day (QID) | INTRAMUSCULAR | Status: DC | PRN

## 2022-06-25 MED ORDER — FERROUS SULFATE 325 (65 FE) MG PO TABS
325.0000 mg | ORAL_TABLET | Freq: Every day | ORAL | Status: DC
  Administered 2022-06-25 – 2022-06-26 (×2): 325 mg via ORAL
  Filled 2022-06-25 (×2): qty 1

## 2022-06-25 MED ORDER — POTASSIUM CHLORIDE 10 MEQ/100ML IV SOLN
10.0000 meq | INTRAVENOUS | Status: DC
  Administered 2022-06-25: 10 meq via INTRAVENOUS
  Filled 2022-06-25: qty 100

## 2022-06-25 MED ORDER — DIAZEPAM 5 MG PO TABS: 5.0000 mg | ORAL_TABLET | Freq: Every day | ORAL | Status: DC | PRN

## 2022-06-25 MED ORDER — ASPIRIN 81 MG PO TBEC
81.0000 mg | DELAYED_RELEASE_TABLET | Freq: Every day | ORAL | Status: DC
  Administered 2022-06-25 – 2022-06-26 (×2): 81 mg via ORAL
  Filled 2022-06-25 (×2): qty 1

## 2022-06-25 MED ORDER — B COMPLEX-C PO TABS
1.0000 | ORAL_TABLET | Freq: Every day | ORAL | Status: DC
  Administered 2022-06-25 – 2022-06-26 (×2): 1 via ORAL
  Filled 2022-06-25 (×2): qty 1

## 2022-06-25 MED ORDER — METOPROLOL SUCCINATE ER 25 MG PO TB24
12.5000 mg | ORAL_TABLET | Freq: Every day | ORAL | Status: DC
  Administered 2022-06-25 (×2): 12.5 mg via ORAL
  Filled 2022-06-25 (×2): qty 1

## 2022-06-25 MED ORDER — ACETAMINOPHEN 650 MG RE SUPP: 650.0000 mg | Freq: Four times a day (QID) | RECTAL | Status: DC | PRN

## 2022-06-25 MED ORDER — ARFORMOTEROL TARTRATE 15 MCG/2ML IN NEBU
15.0000 ug | INHALATION_SOLUTION | Freq: Two times a day (BID) | RESPIRATORY_TRACT | Status: DC
  Administered 2022-06-25 – 2022-06-26 (×4): 15 ug via RESPIRATORY_TRACT
  Filled 2022-06-25 (×4): qty 2

## 2022-06-25 MED ORDER — PANTOPRAZOLE SODIUM 40 MG PO TBEC: 40.0000 mg | DELAYED_RELEASE_TABLET | Freq: Every day | ORAL | Status: DC | PRN

## 2022-06-25 MED ORDER — ACETAMINOPHEN 325 MG PO TABS: 650.0000 mg | ORAL_TABLET | Freq: Four times a day (QID) | ORAL | Status: DC | PRN

## 2022-06-25 MED ORDER — ENOXAPARIN SODIUM 40 MG/0.4ML IJ SOSY
40.0000 mg | PREFILLED_SYRINGE | INTRAMUSCULAR | Status: DC
  Administered 2022-06-25 – 2022-06-26 (×2): 40 mg via SUBCUTANEOUS
  Filled 2022-06-25 (×2): qty 0.4

## 2022-06-25 MED ORDER — ALBUTEROL SULFATE (2.5 MG/3ML) 0.083% IN NEBU: 5.0000 mg | INHALATION_SOLUTION | RESPIRATORY_TRACT | Status: DC | PRN

## 2022-06-25 MED ORDER — PREDNISONE 20 MG PO TABS
40.0000 mg | ORAL_TABLET | Freq: Every day | ORAL | Status: DC
  Administered 2022-06-25 – 2022-06-26 (×2): 40 mg via ORAL
  Filled 2022-06-25 (×2): qty 2

## 2022-06-25 MED ORDER — SERTRALINE HCL 100 MG PO TABS
100.0000 mg | ORAL_TABLET | Freq: Every day | ORAL | Status: DC
  Administered 2022-06-25 (×2): 100 mg via ORAL
  Filled 2022-06-25 (×2): qty 1

## 2022-06-25 MED ORDER — POTASSIUM CHLORIDE CRYS ER 20 MEQ PO TBCR
40.0000 meq | EXTENDED_RELEASE_TABLET | Freq: Once | ORAL | Status: AC
  Administered 2022-06-25: 40 meq via ORAL
  Filled 2022-06-25: qty 2

## 2022-06-25 MED ORDER — POTASSIUM CHLORIDE 20 MEQ PO PACK
40.0000 meq | PACK | Freq: Once | ORAL | Status: AC
  Administered 2022-06-25: 40 meq via ORAL
  Filled 2022-06-25: qty 2

## 2022-06-25 MED ORDER — ONDANSETRON HCL 4 MG PO TABS: 4.0000 mg | ORAL_TABLET | Freq: Four times a day (QID) | ORAL | Status: DC | PRN

## 2022-06-25 MED ORDER — POTASSIUM CHLORIDE CRYS ER 20 MEQ PO TBCR
40.0000 meq | EXTENDED_RELEASE_TABLET | Freq: Every day | ORAL | Status: DC
  Administered 2022-06-26: 40 meq via ORAL
  Filled 2022-06-25: qty 2

## 2022-06-25 MED ORDER — DOCUSATE SODIUM 100 MG PO CAPS: 100.0000 mg | ORAL_CAPSULE | Freq: Two times a day (BID) | ORAL | Status: DC | PRN

## 2022-06-25 MED ORDER — BUDESONIDE 0.5 MG/2ML IN SUSP
0.5000 mg | Freq: Two times a day (BID) | RESPIRATORY_TRACT | Status: DC
  Administered 2022-06-25 – 2022-06-26 (×4): 0.5 mg via RESPIRATORY_TRACT
  Filled 2022-06-25 (×4): qty 2

## 2022-06-25 NOTE — ED Notes (Signed)
Attempted to titrate pt off O2. Pt sating 87-89% on RA. Pt placed back on 2L [place]. MD notified.

## 2022-06-25 NOTE — Progress Notes (Signed)
PROGRESS NOTE  Julia Hernandez KGM:[id number] DOB: Jan 01, 1936 DOA: 06/24/2022 PCP: Sigmund Hazel, MD  HPI/Recap of past 24 hours:  Julia Hernandez is a 86 y.o. 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  She notes that her legs have been swelling over the last couple of weeks for which she had been taking Lasix 20 mg daily.  She was on this detox with fruits and vegetables for which she noticed the leg swelling had improved and therefore stopped her Lasix over the last week.  Also reported concern about her potassium levels getting low.  Labs significant for hemoglobin 10.6, potassium 3, BNP 430.3, and high-sensitivity troponin 26->29.  Chest x-ray revealed mild increased interstitial prominence and pulmonary vascular congestion suggestive of edema.  COVID-19 screening was negative.  TRH called to admit.  06/25/2022: The patient was seen and examined at bedside.  Reports she feels better with diuresing.  Currently on IV Lasix 20 mg twice daily with hypokalemia 3.0 which is being repleted orally.  She denies having any chest pain.    Assessment/Plan: Principal Problem:   Acute respiratory failure with hypoxia (HCC) Active Problems:   Acute on chronic diastolic CHF (congestive heart failure) (HCC)   COPD with acute exacerbation (HCC)   Elevated troponin   Hypokalemia   Chronic pain   Compression fracture of spine (HCC)   Normocytic anemia   Anxiety   Tobacco abuse   DNR (do not resuscitate)/DNI(Do Not Intubate)  Acute respiratory failure with hypoxia secondary to diastolic congestive heart failure exacerbation Patient presents with respiratory distress after reported not taking Lasix due to concern for low potassium levels.  On  admission patient was noted to be hypoxic with O2 saturations as low as 87% on room air.  Chest x-ray noted concern for pulmonary edema.  BNP elevated at 430.3.   Currently on IV Lasix 20 mg twice daily with p.o. KCl replacement.   Last echocardiogram noted EF of 55% with grade 1 diastolic dysfunction in June. -Continuous pulse oximetry with nasal cannula oxygen maintain O2 saturation greater than 92%. Wean off O2 supplementation as tolerated. Obtain home oxygen evaluation on 06/25/2022.   COPD with acute exacerbation Patient reported having wheezing with tickle like cough.   Wheezing is improving on current management. Continue bronchodilators and prednisone.   Elevated troponin Acute.  High-sensitivity troponin is 26-> 29.  EKG without significant ischemic changes.  Thought secondary to demand in setting of acute CHF. She denies any anginal symptoms at the time of this visit.   Hypokalemia Replace electrolytes as indicated. Serum potassium 3.0, magnesium  Repeat BMP and magnesium level.  Chronic pain on chronic use of opiates compression fracture Patient reported having chronic pain related to osteoporosis compression fractures -Continue oxycodone as needed for pain   Normocytic anemia Hemoglobin 10.6 which appears improved from patient's prior hemoglobin of 7 in June. Repeat CBC and continue to monitor No overt bleeding.   Anxiety Stable -Continue sertraline and Valium as needed   Tobacco abuse Patient reports smoking half pack cigarettes per day on average.  Declined need of nicotine patch -Continue to counsel on need of cessation of tobacco use  Generalized weakness PT OT to assess Fall precautions   DNR   DVT prophylaxis: Lovenox subcu daily  Advance Care Planning:   Code Status: DNR     Consults: None   Family Communication: None requested at this time   Status is: Inpatient The patient requires at least 2 midnights for further evaluation and treatment of  present condition.    Objective: Vitals:   06/25/22 0800 06/25/22 0900 06/25/22 1000 06/25/22 1100  BP: 135/65 127/72 (!) 149/76 107/86  Pulse: 75 82 86 84  Resp: (!) 26 (!) 30 (!) 29 (!) 25  Temp:    97.6 F (36.4 C)  TempSrc:    Oral  SpO2: 96% 93% 94% 97%  Weight:      Height:        Intake/Output Summary (Last 24 hours) at 06/25/2022 1408 Last data filed at 06/25/2022 1000 Gross per 24 hour  Intake --  Output 900 ml  Net -900 ml   Filed Weights   06/24/22 2120  Weight: 45.4 kg    Exam:  General: 86 y.o. year-old female well developed well nourished in no acute distress.  Alert and oriented x3. Cardiovascular: Regular rate and rhythm with no rubs or gallops.  No thyromegaly or JVD noted.   Respiratory: Mild rales at bases.  Mild wheezing noted bilaterally.  Good inspiration effort.   Abdomen: Soft nontender nondistended with normal bowel sounds x4 quadrants. Musculoskeletal: Trace lower extremity edema bilaterally.   Skin: No ulcerative lesions noted or rashes, Psychiatry: Mood is appropriate for condition and setting   Data Reviewed: CBC: Recent Labs  Lab 06/24/22 2136  WBC 9.9  HGB 10.6*  HCT 34.1*  MCV 88.8  PLT 302   Basic Metabolic Panel: Recent Labs  Lab 06/24/22 2136  NA 141  K 3.0*  CL 102  CO2 29  GLUCOSE 125*  BUN 13  CREATININE 0.90  CALCIUM 9.0   GFR: Estimated Creatinine Clearance: 28.4 mL/min (by C-G formula based on SCr of 0.9 mg/dL). Liver Function Tests: No results for input(s): "AST", "ALT", "ALKPHOS", "BILITOT", "PROT", "ALBUMIN" in the last 168 hours. No results for input(s): "LIPASE", "AMYLASE" in the last 168 hours. No results for input(s): "AMMONIA" in the last 168 hours. Coagulation Profile: No results for input(s): "INR", "PROTIME" in the last 168 hours. Cardiac Enzymes: No results for input(s): "CKTOTAL", "CKMB", "CKMBINDEX", "TROPONINI" in the last 168 hours. BNP (last 3 results) No results for input(s): "PROBNP"  in the last 8760 hours. HbA1C: No results for input(s): "HGBA1C" in the last 72 hours. CBG: No results for input(s): "GLUCAP" in the last 168 hours. Lipid Profile: No results for input(s): "CHOL", "HDL", "LDLCALC", "TRIG", "CHOLHDL", "LDLDIRECT" in the last 72 hours. Thyroid Function Tests: No results for input(s): "TSH", "T4TOTAL", "FREET4", "T3FREE", "THYROIDAB" in the last 72 hours. Anemia Panel: No results for input(s): "VITAMINB12", "FOLATE", "FERRITIN", "TIBC", "IRON", "RETICCTPCT" in the last 72 hours. Urine analysis:    Component Value Date/Time   COLORURINE YELLOW 03/19/2022 1749   APPEARANCEUR CLEAR 03/19/2022 1749   LABSPEC 1.025 03/19/2022 1749   PHURINE 5.5 03/19/2022 1749   GLUCOSEU NEGATIVE 03/19/2022 1749   HGBUR NEGATIVE 03/19/2022 1749   BILIRUBINUR NEGATIVE 03/19/2022 1749   KETONESUR NEGATIVE 03/19/2022 1749   PROTEINUR TRACE (A) 03/19/2022 1749   NITRITE NEGATIVE 03/19/2022 1749   LEUKOCYTESUR NEGATIVE 03/19/2022 1749   Sepsis Labs: @LABRCNTIP (procalcitonin:4,lacticidven:4)  ) Recent Results (from the past 240 hour(s))  SARS Coronavirus 2 by RT PCR (hospital order, performed in [facility] hospital lab) *cepheid single result test* Anterior Nasal Swab     Status: None  Collection Time: 06/24/22 10:33 PM   Specimen: Anterior Nasal Swab  Result Value Ref Range Status   SARS Coronavirus 2 by RT PCR NEGATIVE NEGATIVE Final    Comment: (NOTE) SARS-CoV-2 target nucleic acids are NOT DETECTED.  The SARS-CoV-2 RNA is generally detectable in upper and lower respiratory specimens during the acute phase of infection. The lowest concentration of SARS-CoV-2 viral copies this assay can detect is 250 copies / mL. A negative result does not preclude SARS-CoV-2 infection and should not be used as the sole basis for treatment or other patient management decisions.  A negative result may occur with improper specimen collection / handling, submission of specimen  other than nasopharyngeal swab, presence of viral mutation(s) within the areas targeted by this assay, and inadequate number of viral copies (<250 copies / mL). A negative result must be combined with clinical observations, patient history, and epidemiological information.  Fact Sheet for Patients:   https://www.patel.info/  Fact Sheet for Healthcare Providers: https://Serena Petterson.com/  This test is not yet approved or  cleared by the [place] FDA and has been authorized for detection and/or diagnosis of SARS-CoV-2 by FDA under an Emergency Use Authorization (EUA).  This EUA will remain in effect (meaning this test can be used) for the duration of the COVID-19 declaration under Section 564(b)(1) of the Act, 21 U.S.C. section 360bbb-3(b)(1), unless the authorization is terminated or revoked sooner.  Performed at [facility], [place] [address] [place], [place] [address]       Studies: DG Chest Port 1 View  Result Date: 06/24/2022 CLINICAL DATA:  Dyspnea and high blood pressure EXAM: PORTABLE CHEST 1 VIEW COMPARISON:  Radiographs 03/26/2022 FINDINGS: Since 03/26/2022, mild increase in interstitial prominence and pulmonary vascular congestion suggestive of edema. Question small left pleural effusion. No focal consolidation or pneumothorax. Stable cardiomediastinal silhouette given patient rotation. No acute osseous abnormality. IMPRESSION: Mild increase in interstitial prominence and pulmonary vascular congestion suggestive of edema. Electronically Signed   By: Placido Sou M.D.   On: 06/24/2022 22:05    Scheduled Meds:  arformoterol  15 mcg Nebulization BID   aspirin EC  81 mg Oral Daily   B-complex with vitamin C  1 tablet Oral Daily   budesonide (PULMICORT) nebulizer solution  0.5 mg Nebulization BID   enoxaparin (LOVENOX) injection  40 mg Subcutaneous Q24H   ferrous sulfate  325 mg Oral Q breakfast   furosemide  20 mg Intravenous  BID   ipratropium-albuterol  3 mL Nebulization BID   metoprolol succinate  12.5 mg Oral QHS   [START ON 06/26/2022] potassium chloride  40 mEq Oral Daily   predniSONE  40 mg Oral Q breakfast   sertraline  100 mg Oral QHS   sodium chloride flush  3 mL Intravenous Q12H    Continuous Infusions:   LOS: 0 days     Kayleen Memos, MD [facility] Pager [phone]  If 7PM-7AM, please contact night-coverage [email or web address] Password [facility] 06/25/2022, 2:08 PM

## 2022-06-25 NOTE — H&P (Addendum)
History and Physical    Patient: Julia Hernandez DOB: December 26, 1935 DOA: 06/24/2022 DOS: the patient was seen and examined on 06/25/2022 PCP: Kathyrn Lass, MD  Patient coming from: Home via EMS  Chief Complaint:  Chief Complaint  Patient presents with   Chest Pain   HPI: Julia Hernandez is a 86 y.o. female with medical history significant of diastolic CHF with EF [id number] with grade 1 diastolic, mitral valve regurgitation, CAD, COPD, CKD, osteoporosis with compression fractures of the thoracic spine, and tobacco use who presented with complaints of progressively worsening shortness of breath over the last 3 to 4 days.  She notes that her legs have been swelling over the last couple of weeks for which she had been taking Lasix 20 mg daily.  She was on this detox with fruits and vegetables for which she noticed the leg swelling had improved and therefore stopped her Lasix over the last week.  Also reported concern about her potassium levels getting low.  Patient complained of having orthopnea, wheezing, and inability to sleep at night due to breathing.  She reported having a "tickle in her throat" like cough that was nonproductive.  During this time she had noticed some of her leg swelling had come back.  Other associated symptoms included chest pains and back pains from stress fractures from her osteoporosis.  Patient admits that she smokes about half pack cigarettes per day on average.  She had previously quit after having a heart attack years ago but slowly it started again.  Normally she is not on oxygen at baseline.  Denies having any fever, abdominal pain, nausea, vomiting, or diarrhea symptoms.  Last night due to her work of breathing she became very concerned and EMS.  Upon admission into the emergency department patient was noted to be tachycardic, tachypneic, blood pressure elevated up to 191/91, and O2 saturations as low as 87% on room air with improvement on 2 L nasal cannula oxygen.  Labs  significant for hemoglobin 10.6, potassium 3, BNP 430.3, and high-sensitivity troponin 26->29.  Chest x-ray revealed mild increased interstitial prominence and pulmonary vascular congestion suggestive of edema.  COVID-19 screening was pending.  Patient has been given a total of 60 mEq of potassium, Solu-Medrol 80 mg IV, DuoNeb breathing treatments, Pepcid, Lasix 20 mg IV and Tylenol.  TRH called to admit.    Review of Systems: As mentioned in the history of present illness. All other systems reviewed and are negative. Past Medical History:  Diagnosis Date   Asthma    Hypertension    Past Surgical History:  Procedure Laterality Date   BALLOON DILATION N/A 03/24/2022   Procedure: BALLOON DILATION;  Surgeon: Ronnette Juniper, MD;  Location: [facility];  Service: Gastroenterology;  Laterality: N/A;   ESOPHAGOGASTRODUODENOSCOPY N/A 03/24/2022   Procedure: ESOPHAGOGASTRODUODENOSCOPY (EGD);  Surgeon: Ronnette Juniper, MD;  Location: [facility];  Service: Gastroenterology;  Laterality: N/A;   Social History:  reports that she has been smoking cigarettes. She has been smoking an average of 1 pack per day. She has never used smokeless tobacco. She reports that she does not drink alcohol and does not use drugs.  Allergies  Allergen Reactions   Influenza Vac Split Quad Shortness Of Breath   Ditropan Xl [Oxybutynin] Other (See Comments)    Unknown reaction   Simvastatin Rash    History reviewed. No pertinent family history.  Prior to Admission medications   Medication Sig Start Date End Date Taking? Authorizing Provider  acetaminophen (TYLENOL) 500 MG tablet  Take 500 mg by mouth 5 (five) times daily.    [provider]  acyclovir ointment (ZOVIRAX) 5 % Apply 1 application topically 3 (three) times daily as needed (outbreaks).    [provider]  aspirin EC 81 MG tablet Take 1 tablet (81 mg total) by mouth daily. Swallow whole. 03/31/22   Rolly Salter, MD  atorvastatin (LIPITOR) 40 MG  tablet Take 40 mg by mouth daily. 03/02/22   [provider]  B Complex-C (B-COMPLEX WITH VITAMIN C) tablet Take 1 tablet by mouth daily. 03/31/22   Rolly Salter, MD  diazepam (VALIUM) 5 MG tablet Take 1 tablet (5 mg total) by mouth daily as needed for anxiety. 03/30/22   Rolly Salter, MD  docusate sodium (COLACE) 100 MG capsule Take 1 capsule (100 mg total) by mouth 2 (two) times daily. 03/30/22 03/30/23  Rolly Salter, MD  ferrous sulfate 325 (65 FE) MG tablet Take 1 tablet (325 mg total) by mouth 2 (two) times daily with a meal. 03/30/22   Rolly Salter, MD  furosemide (LASIX) 20 MG tablet Take 1 tablet (20 mg total) by mouth daily as needed (weight gain of >3 lbs in 1 day or >5 lbs in 1 week). 03/30/22   Rolly Salter, MD  Ipratropium-Albuterol (COMBIVENT RESPIMAT) 20-100 MCG/ACT AERS respimat Inhale 1 puff into the lungs 4 (four) times daily as needed for shortness of breath.    [provider]  lactose free nutrition (BOOST PLUS) LIQD Take 237 mLs by mouth 3 (three) times daily with meals. 03/30/22   Rolly Salter, MD  magic mouthwash w/lidocaine SOLN Take 5 mLs by mouth 4 (four) times daily as needed for mouth pain.    [provider]  melatonin 10 MG TABS Take 5 mg by mouth at bedtime as needed. 03/30/22   Rolly Salter, MD  metoprolol succinate (TOPROL-XL) 25 MG 24 hr tablet Take 12.5 mg by mouth at bedtime. 03/02/22   [provider]  oxyCODONE (OXY IR/ROXICODONE) 5 MG immediate release tablet Take 0.5 tablets (2.5 mg total) by mouth every 4 (four) hours as needed for moderate pain. 03/30/22   Rolly Salter, MD  pantoprazole (PROTONIX) 40 MG tablet Take 1 tablet (40 mg total) by mouth daily. 03/31/22   Rolly Salter, MD  polyethylene glycol (MIRALAX / GLYCOLAX) 17 g packet Take 17 g by mouth daily as needed for mild constipation. 03/30/22   Rolly Salter, MD  sertraline (ZOLOFT) 100 MG tablet Take 100 mg by mouth at bedtime.    [provider]  SYMBICORT 160-4.5 MCG/ACT inhaler Inhale 2 puffs into the lungs 2 (two) times daily. 03/02/22   [provider]    Physical Exam: Vitals:   06/24/22 2130 06/24/22 2215 06/24/22 2300 06/25/22 0000  BP: (!) 191/91 (!) 172/80 (!) 157/73 (!) 166/80  Pulse: 95 92 94 92  Resp: (!) 23 (!) 21 19 (!) 23  Temp:      TempSrc:      SpO2: 97% 99% 96% 100%  Weight:      Height:       Constitutional: Thin frail elderly female in some mild respiratory distress Eyes: PERRL, lids and conjunctivae normal ENMT: Mucous membranes are moist. Posterior pharynx clear of any exudate or lesions.   Neck: normal, supple, with JVD present. Respiratory: Normal respiratory effort with crackles appreciated in expiratory wheezes.  Patient currently on 2 L nasal cannula oxygen with O2 saturations  maintained. Cardiovascular: Regular rate and rhythm, no murmurs / rubs / gallops.  +1 pitting bilateral lower extremity edema.   Abdomen: no tenderness, no masses palpated. Bowel sounds positive.  Musculoskeletal:    Thoracic kyphosis. Skin: no rashes, lesions, ulcers. No induration Neurologic: CN 2-12 grossly intact. Sensation intact, DTR normal. Strength 5/5 in all 4.  Psychiatric: Normal judgment and insight. Alert and oriented x 3. Normal mood.   Data Reviewed:  EKG reveals sinus rhythm at 99 bpm with incomplete right bundle branch block QTc 492  Assessment and Plan: Acute respiratory failure with hypoxia secondary to diastolic congestive heart failure exacerbation Patient presents with respiratory distress after reported not taking Lasix due to concern for low potassium levels.  On admission patient was noted to be hypoxic with O2 saturations as low as 87% on room air.  Chest x-ray noted concern for pulmonary edema.  BNP elevated at 430.3.  Patient has been given Lasix 20 mg IV.  Last echocardiogram noted EF of 55% with grade 1 diastolic dysfunction in June. -Admit to a cardiac telemetry  bed -Heart failure order set utilized -Continuous pulse oximetry with nasal cannula oxygen maintain O2 saturation greater than 92%. -Wean to room air as able -Continue Lasix 20 mg IV twice daily.  Adjust diuresis as needed -No need for repeat echo  COPD with acute exacerbation  Patient reported having wheezing with tickle like cough.  Patient has been given Solu-Medrol IV and breathing treatments while in the ED. -Continue DuoNebs twice daily and as needed -Brovana and budesonide nebs substitution for Symbicort -Prednisone 40 mg daily  Elevated troponin Acute.  High-sensitivity troponin is 26-> 29.  EKG without significant ischemic changes.  Thought secondary to demand in setting of respiratory distress. -Continue to monitor  Hypokalemia Acute.  Initial potassium 3.  Patient had been ordered a total of 60 mEq of potassium chloride by the ED provider.  She was unable to tolerate potassium chloride 20 mEq IV and was there for discontinued. -Given additional potassium chloride 40 mEq p.o. -Continue to monitor and replace as needed  Chronic pain on chronic use of opiates compression fracture Patient reported having chronic pain related to osteoporosis compression fractures -Continue oxycodone as needed for pain  Normocytic anemia Hemoglobin 10.6 which appears improved from patient's prior hemoglobin of 7 in June. -Continue to monitor  Anxiety -Continue sertraline and Valium as needed  Tobacco abuse Patient reports smoking half pack cigarettes per day on average.  Declined need of nicotine patch -Continue to counsel on need of cessation of tobacco use  DNR  DVT prophylaxis: Lovenox Advance Care Planning:   Code Status: DNR    Consults: None  Family Communication: None requested at this time Severity of Illness: The appropriate patient status for this patient is INPATIENT. Inpatient status is judged to be reasonable and necessary in order to provide the required intensity of  service to ensure the patient's safety. The patient's presenting symptoms, physical exam findings, and initial radiographic and laboratory data in the context of their chronic comorbidities is felt to place them at high risk for further clinical deterioration. Furthermore, it is not anticipated that the patient will be medically stable for discharge from the hospital within 2 midnights of admission.   * I certify that at the point of admission it is my clinical judgment that the patient will require inpatient hospital care spanning beyond 2 midnights from the point of admission due to high intensity of service, high risk for further deterioration and  high frequency of surveillance required.*  Author: Norval Morton, MD 06/25/2022 12:34 AM  For on call review [email or web address].

## 2022-06-26 ENCOUNTER — Encounter (HOSPITAL_COMMUNITY): Payer: Self-pay | Admitting: Internal Medicine

## 2022-06-26 DIAGNOSIS — J9601 Acute respiratory failure with hypoxia: Secondary | ICD-10-CM | POA: Diagnosis not present

## 2022-06-26 LAB — BASIC METABOLIC PANEL
Anion gap: 11 (ref 5–15)
BUN: 22 mg/dL (ref 8–23)
CO2: 33 mmol/L — ABNORMAL HIGH (ref 22–32)
Calcium: 9.3 mg/dL (ref 8.9–10.3)
Chloride: 98 mmol/L (ref 98–111)
Creatinine, Ser: 0.95 mg/dL (ref 0.44–1.00)
GFR, Estimated: 59 mL/min — ABNORMAL LOW (ref >60)
Glucose, Bld: 80 mg/dL (ref 70–99)
Potassium: 4.3 mmol/L (ref 3.5–5.1)
Sodium: 142 mmol/L (ref 135–145)

## 2022-06-26 LAB — MAGNESIUM: Magnesium: 1.9 mg/dL (ref 1.7–2.4)

## 2022-06-26 MED ORDER — POTASSIUM CHLORIDE CRYS ER 20 MEQ PO TBCR: 20.0000 meq | EXTENDED_RELEASE_TABLET | Freq: Every day | ORAL | 0 refills | Status: DC

## 2022-06-26 NOTE — Evaluation (Signed)
Physical Therapy Evaluation Patient Details Name: Julia Hernandez MRN: [id number] DOB: 1936-03-17 Today's Date: 06/26/2022  History of Present Illness  86 yo female with onset of SOB and LE edema had stopped her lasix, now admitted on 9/13 for management.  PMHx:  CHF, EF 55%, mitral valve regurg, CAD, COPD, CKD, osteoporosis, compression fractures Thoracic spine, chronic LE edema  Clinical Impression  Pt was seen for progression to walk with O2 needed at 1L today, as she desaturated to 85% on room air bedside.  Her plan is to ask for HHPT to strengthen and monitor sats, but per dc planning team pt is likely to decline this.  Pt is aware to use Rollator at home, and will continue to strengthen if she accepts help.  Follow acutely for PT goals as her stay permits.       Recommendations for follow up therapy are one component of a multi-disciplinary discharge planning process, led by the attending physician.  Recommendations may be updated based on patient status, additional functional criteria and insurance authorization.  Follow Up Recommendations Home health PT      Assistance Recommended at Discharge Set up Supervision/Assistance  Patient can return home with the following  A little help with walking and/or transfers;Assistance with cooking/housework;Assist for transportation    Equipment Recommendations None recommended by PT  Recommendations for Other Services       Functional Status Assessment Patient has had a recent decline in their functional status and demonstrates the ability to make significant improvements in function in a reasonable and predictable amount of time.     Precautions / Restrictions Precautions Precautions: Fall Precaution Comments: monitor O2 sats Restrictions Weight Bearing Restrictions: No      Mobility  Bed Mobility Overal bed mobility: Modified Independent                  Transfers Overall transfer level: Modified independent Equipment used:  None                    Ambulation/Gait Ambulation/Gait assistance: Min assist Gait Distance (Feet): 80 Feet (40 x 2) Assistive device: 1 person hand held assist Gait Pattern/deviations: Step-through pattern, Wide base of support, Trunk flexed Gait velocity: reduced Gait velocity interpretation: <1.31 ft/sec, indicative of household ambulator Pre-gait activities: standing sat ck General Gait Details: slow pace but with control of balance with min guard on PT  Stairs            Wheelchair Mobility    Modified Rankin (Stroke Patients Only)       Balance Overall balance assessment: Needs assistance Sitting-balance support: Feet supported Sitting balance-Leahy Scale: Fair                                       Pertinent Vitals/Pain Pain Assessment Pain Assessment: No/denies pain    Home Living Family/patient expects to be discharged to:: Private residence Living Arrangements: Alone Available Help at Discharge: Family;Available PRN/intermittently Type of Home: House Home Access: Level entry       Home Layout: One level Home Equipment: Agricultural consultant (2 wheels);Rollator (4 wheels);Shower seat;Grab bars - tub/shower;Cane - single point Additional Comments: declines HHPT, has family every day checking on her    Prior Function Prior Level of Function : Independent/Modified Independent;Driving             Mobility Comments: no AD recently per pt  Hand Dominance   Dominant Hand: Right    Extremity/Trunk Assessment   Upper Extremity Assessment Upper Extremity Assessment: Overall WFL for tasks assessed    Lower Extremity Assessment Lower Extremity Assessment: Generalized weakness    Cervical / Trunk Assessment Cervical / Trunk Assessment: Kyphotic  Communication   Communication: No difficulties  Cognition Arousal/Alertness: Awake/alert Behavior During Therapy: WFL for tasks assessed/performed Overall Cognitive Status:  Within Functional Limits for tasks assessed                                          General Comments General comments (skin integrity, edema, etc.): Pt was seen for progression to ck sats with mobility and note her sats dropped on room air to 85% pregait.    Exercises     Assessment/Plan    PT Assessment Patient needs continued PT services  PT Problem List Cardiopulmonary status limiting activity;Decreased strength       PT Treatment Interventions DME instruction;Gait training;Functional mobility training;Therapeutic activities;Therapeutic exercise;Balance training;Neuromuscular re-education;Patient/family education    PT Goals (Current goals can be found in the Care Plan section)  Acute Rehab PT Goals Patient Stated Goal: to go home with family PT Goal Formulation: With patient Time For Goal Achievement: 07/03/22 Potential to Achieve Goals: Good    Frequency Min 3X/week     Co-evaluation               AM-PAC PT "6 Clicks" Mobility  Outcome Measure Help needed turning from your back to your side while in a flat bed without using bedrails?: None Help needed moving from lying on your back to sitting on the side of a flat bed without using bedrails?: None Help needed moving to and from a bed to a chair (including a wheelchair)?: None Help needed standing up from a chair using your arms (e.g., wheelchair or bedside chair)?: None Help needed to walk in hospital room?: A Little Help needed climbing 3-5 steps with a railing? : A Little 6 Click Score: 22    End of Session Equipment Utilized During Treatment: Gait belt;Oxygen Activity Tolerance: Patient tolerated treatment well;Treatment limited secondary to medical complications (Comment) Patient left: in bed;with call bell/phone within reach;with bed alarm set Nurse Communication: Mobility status;Other (comment) (sats with mobility) PT Visit Diagnosis: Unsteadiness on feet (R26.81);Muscle weakness  (generalized) (M62.81);Difficulty in walking, not elsewhere classified (R26.2)    Time: 1018-1050 PT Time Calculation (min) (ACUTE ONLY): 32 min   Charges:   PT Evaluation $PT Eval Moderate Complexity: 1 Mod PT Treatments $Gait Training: 8-22 mins       Ivar Drape 06/26/2022, 11:12 AM  Samul Dada, PT PhD Acute Rehab Dept. Number: [facility] [id number] and [facility] [phone]

## 2022-06-26 NOTE — TOC Transition Note (Addendum)
Transition of Care [facility]) - CM/SW Discharge Note   Patient Details  Name: Julia Hernandez MRN: [id number] Date of Birth: 1936-01-30  [facility] CM/SW Contact:  Leone Haven, RN Phone Number: 06/26/2022, 9:14 AM   Clinical Narrative:    Patient is for dc today,she is set up with [facility] for disease management and med management.  Daughter will transport her home today, she will be checked by staff to see if she needs home oxygen. NCM ordered oyxgen thru [employer] per Dr. Margo Aye verbal order. The etank will be delivered to the room , she will be evaluated at home for poc.  And they will also set up concentrator at patient's home.    Final next level of care: Home w Home Health Services Barriers to Discharge: No Barriers Identified   Patient Goals and CMS Choice Patient states their goals for this hospitalization and ongoing recovery are:: return home CMS Medicare.gov Compare Post Acute Care list provided to:: Patient Choice offered to / list presented to : Patient  Discharge Placement                       Discharge Plan and Services   Discharge Planning Services: CM Consult Post Acute Care Choice: Home Health                    HH Arranged: RN, Disease Management HH Agency: [facility] Date [facility] Agency Contacted: 06/26/22 Time HH Agency Contacted: [phone] Representative spoke with at [facility] Agency: Tresa Endo  Social Determinants of Health (SDOH) Interventions     Readmission Risk Interventions    06/26/2022    8:54 AM  Readmission Risk Prevention Plan  Transportation Screening Complete  PCP or Specialist Appt within 3-5 Days Complete  HRI or Home Care Consult Complete  Social Work Consult for Recovery Care Planning/Counseling Complete  Palliative Care Screening Not Applicable  Medication Review [employer]) Complete

## 2022-06-26 NOTE — Progress Notes (Signed)
Heart Failure Nurse Navigator Progress Note  PCP: Sigmund Hazel, MD PCP-Cardiologist: Whitney Post Admission Diagnosis: None Admitted from: Home via EMS  Presentation:   Julia Hernandez presented with shortness of breath, chest pain, recently stopped taking her lasix for 1 week, because she was worried about her potassium level being too low. BP 172/85, HR 102, BNP 430, K+ 3.0, Troponin 26, EKG showed sinus rhythm incomplete right bundle branch block. Solu-medrol and nebs given.  Patient is very interactive in education on heart failure, the sign and symptoms, daily weights, when to call her doctor or go to the ER, Diet/ fluid restrictions ( she states she eats lots of soups, doesn't really watch her salt and loves pepsi). She usually takes all her medications, however over the last week she felt as if her potassium was low, so she stopped taking her lasix for 1 week, and then she started having edema to her legs. Patient reminded to keep all medical appointments. Patient voiced her understanding of the education and a hospital HF TOC appointment was made for 07/07/2022 @ 3 pm.   ECHO/ LVEF: 55% G1DD   Clinical Course:  Past Medical History:  Diagnosis Date   Asthma    Hypertension      Social History   Socioeconomic History   Marital status: Single    Spouse name: Not on file   Number of children: 1   Years of education: Not on file   Highest education level: High school graduate  Occupational History   Occupation: Retired  Tobacco Use   Smoking status: Every Day    Packs/day: 0.50    Years: 60.00    Total pack years: 30.00    Types: Cigarettes   Smokeless tobacco: Never  Vaping Use   Vaping Use: Never used  Substance and Sexual Activity   Alcohol use: No   Drug use: Never   Sexual activity: Not on file  Other Topics Concern   Not on file  Social History Narrative   Not on file   Social Determinants of Health   Financial Resource Strain: Low Risk  (06/26/2022)   Overall  Financial Resource Strain (CARDIA)    Difficulty of Paying Living Expenses: Not hard at all  Food Insecurity: No Food Insecurity (06/26/2022)   Hunger Vital Sign    Worried About Running Out of Food in the Last Year: Never true    Ran Out of Food in the Last Year: Never true  Transportation Needs: No Transportation Needs (06/26/2022)   PRAPARE - Administrator, Civil Service (Medical): No    Lack of Transportation (Non-Medical): No  Physical Activity: Not on file  Stress: Not on file  Social Connections: Not on file   Education Assessment and Provision:  Detailed education and instructions provided on heart failure disease management including the following:  Signs and symptoms of Heart Failure When to call the physician Importance of daily weights Low sodium diet Fluid restriction Medication management Anticipated future follow-up appointments  Patient education given on each of the above topics.  Patient acknowledges understanding via teach back method and acceptance of all instructions.  Education Materials:  "Living Better With Heart Failure" Booklet, HF zone tool, & Daily Weight Tracker Tool.  Patient has scale at home: yes Patient has pill box at home: NA    High Risk Criteria for Readmission and/or Poor Patient Outcomes: Heart failure hospital admissions (last 6 months): 1  No Show rate: 0 Difficult social situation: No Demonstrates medication  adherence: Yes Primary Language: English Literacy level: Reading, writing, and comprehension  Barriers of Care:   Diet/ fluids (salt, Pepsi, and soup) Daily weights Smoking cessation  Considerations/Referrals:   Referral made to Heart Failure Pharmacist Stewardship: Yes Referral made to Heart Failure CSW/NCM TOC: No Referral made to Heart & Vascular TOC clinic: Yes, 07/07/2022 @ 3 pm.   Items for Follow-up on DC/TOC: Diet/ fluid restrictions ( Salt, soup, and Pepsi) Daily weights Smoking cessation   Rhae Hammock, BSN, RN Heart Failure Teacher, adult education Only

## 2022-06-26 NOTE — Discharge Summary (Signed)
Discharge Summary  Julia Hernandez ZSW:[id number] DOB: April 26, 1936  PCP: Sigmund Hazel, MD  Admit date: 06/24/2022 Discharge date: 06/26/2022  Time spent: 35 minutes.  Recommendations for Outpatient Follow-up:  Follow-up with your primary care provider and cardiology within 1-2 weeks. Take your medications as prescribed. Completely abstain from tobacco use  Discharge Diagnoses:  Active Hospital Problems   Diagnosis Date Noted   Acute respiratory failure with hypoxia (HCC) 06/25/2022    Priority: 1.   Acute on chronic diastolic CHF (congestive heart failure) (HCC) 06/25/2022    Priority: 1.   COPD with acute exacerbation (HCC) 11/10/2018    Priority: 2.   Elevated troponin 06/25/2022    Priority: 3.   Hypokalemia 06/25/2022    Priority: 4.   Chronic pain 06/25/2022    Priority: 5.   Compression fracture of spine (HCC) 06/25/2022    Priority: 5.   Normocytic anemia 03/19/2022    Priority: 6.   Anxiety 06/25/2022    Priority: 7.   Tobacco abuse 03/19/2022    Priority: 8.   DNR (do not resuscitate)/DNI(Do Not Intubate) 03/20/2022    Priority: 9.    Resolved Hospital Problems  No resolved problems to display.    Discharge Condition: Stable.  Diet recommendation: Resume previous diet.  Vitals:   06/26/22 0730 06/26/22 1015  BP:    Pulse:    Resp:    Temp:    SpO2: 97% 91%    History of present illness:  Julia Hernandez is a 86 y.o. 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  She notes that her legs have been swelling over the last couple of weeks for which she had been taking Lasix 20 mg daily.  She was on this detox with fruits and vegetables for which she noticed the leg swelling had improved and  therefore stopped her Lasix over the last week.  Also reported concern about her potassium levels getting low.  Labs significant for hemoglobin 10.6, potassium 3, BNP 430.3, and high-sensitivity troponin 26->29.  Chest x-ray revealed mild increased interstitial prominence and pulmonary vascular congestion suggestive of edema.  COVID-19 screening was negative.  TRH called to admit.   06/26/2022: The patient was seen and examined at bedside.  There were no acute events overnight.  She has no new complaints.  She is eager to go home  Hospital Course:  Principal Problem:   Acute respiratory failure with hypoxia (HCC) Active Problems:   Acute on chronic diastolic CHF (congestive heart failure) (HCC)   COPD with acute exacerbation (HCC)   Elevated troponin   Hypokalemia   Chronic pain   Compression fracture of spine (HCC)   Normocytic anemia   Anxiety   Tobacco abuse   DNR (do not resuscitate)/DNI(Do Not Intubate)  Acute respiratory failure with hypoxia secondary to diastolic congestive heart failure exacerbation Patient presents with respiratory distress after reported not taking Lasix due to concern for low potassium levels.  On admission patient was noted to be hypoxic with O2 saturations as low as 87% on room air.  Chest x-ray noted concern for pulmonary edema.  BNP elevated at 430.3.   Currently on IV Lasix 20 mg twice daily with p.o. KCl replacement.   Last echocardiogram noted EF of 55% with grade 1  diastolic dysfunction in June. Requiring 1 to 2 L to maintain O2 saturation greater than 91% Follow-up with your PCP and cardiology   COPD with acute exacerbation Resolved   Elevated troponin Acute.  High-sensitivity troponin is 26-> 29.  EKG without significant ischemic changes.  Thought secondary to demand in setting of acute CHF. She denies any anginal symptoms at the time of this visit.   Resolved post repletion hypokalemia Serum potassium 4.3 from 3.0.   Chronic pain on chronic  use of opiates compression fracture Resume home regimen   Chronic normocytic anemia Hemoglobin 10.6 which appears improved from patient's prior hemoglobin of 7 in June. Follow-up with your PCP   Anxiety Stable -Continue sertraline and Valium as needed Follow-up with your primary care provider   Tobacco abuse Patient reports smoking half pack cigarettes per day on average.  Declined need of nicotine patch Tobacco cessation strongly recommended.   Generalized weakness Continue fall precautions.   DNR      Consults: None    Discharge Exam: BP 139/61 (BP Location: Left Arm)   Pulse 71   Temp 97.6 F (36.4 C) (Oral)   Resp 20   Ht 4\' 9"  (1.448 m)   Wt 46.2 kg   SpO2 91%   BMI 22.04 kg/m  General: 86 y.o. year-old female well developed well nourished in no acute distress.  Alert and oriented x3. Cardiovascular: Regular rate and rhythm with no rubs or gallops.  No thyromegaly or JVD noted.   Respiratory: Clear to auscultation with no wheezes or rales. Good inspiratory effort. Abdomen: Soft nontender nondistended with normal bowel sounds x4 quadrants. Musculoskeletal: No lower extremity edema. 2/4 pulses in all 4 extremities. Skin: No ulcerative lesions noted or rashes, Psychiatry: Mood is appropriate for condition and setting  Discharge Instructions You were cared for by a hospitalist during your hospital stay. If you have any questions about your discharge medications or the care you received while you were in the hospital after you are discharged, you can call the unit and asked to speak with the hospitalist on call if the hospitalist that took care of you is not available. Once you are discharged, your primary care physician will handle any further medical issues. Please note that NO REFILLS for any discharge medications will be authorized once you are discharged, as it is imperative that you return to your primary care physician (or establish a relationship with a primary  care physician if you do not have one) for your aftercare needs so that they can reassess your need for medications and monitor your lab values.   Allergies as of 06/26/2022       Reactions   Influenza Vac Split Quad Shortness Of Breath   Ditropan Xl [oxybutynin] Other (See Comments)   Unknown reaction   Simvastatin Rash        Medication List     TAKE these medications    acetaminophen 500 MG tablet Commonly known as: TYLENOL Take 500 mg by mouth 5 (five) times daily.   acyclovir ointment 5 % Commonly known as: ZOVIRAX Apply 1 application topically 3 (three) times daily as needed (outbreaks).   aspirin EC 81 MG tablet Take 1 tablet (81 mg total) by mouth daily. Swallow whole.   B-complex with vitamin C tablet Take 1 tablet by mouth daily.   Combivent Respimat 20-100 MCG/ACT Aers respimat Generic drug: Ipratropium-Albuterol Inhale 1 puff into the lungs 4 (four) times daily as needed for shortness of breath.   diazepam  5 MG tablet Commonly known as: VALIUM Take 1 tablet (5 mg total) by mouth daily as needed for anxiety.   docusate sodium 100 MG capsule Commonly known as: Colace Take 1 capsule (100 mg total) by mouth 2 (two) times daily. What changed:  when to take this reasons to take this   ferrous sulfate 325 (65 FE) MG tablet Take 1 tablet (325 mg total) by mouth 2 (two) times daily with a meal. What changed: when to take this   furosemide 20 MG tablet Commonly known as: LASIX Take 1 tablet (20 mg total) by mouth daily as needed (weight gain of >3 lbs in 1 day or >5 lbs in 1 week).   magic mouthwash w/lidocaine Soln Take 5 mLs by mouth 4 (four) times daily as needed for mouth pain.   metoprolol succinate 25 MG 24 hr tablet Commonly known as: TOPROL-XL Take 12.5 mg by mouth at bedtime.   oxyCODONE 5 MG immediate release tablet Commonly known as: Oxy IR/ROXICODONE Take 0.5 tablets (2.5 mg total) by mouth every 4 (four) hours as needed for moderate  pain. What changed: how much to take   pantoprazole 40 MG tablet Commonly known as: PROTONIX Take 1 tablet (40 mg total) by mouth daily. What changed:  when to take this reasons to take this   potassium chloride SA 20 MEQ tablet Commonly known as: KLOR-CON M Take 1 tablet (20 mEq total) by mouth daily for 14 days.   sertraline 100 MG tablet Commonly known as: ZOLOFT Take 100 mg by mouth at bedtime.   Symbicort 160-4.5 MCG/ACT inhaler Generic drug: budesonide-formoterol Inhale 2 puffs into the lungs 2 (two) times daily.               Durable Medical Equipment  (From admission, onward)           Start     Ordered   06/26/22 1120  For home use only DME oxygen  Once       Question Answer Comment  Length of Need 6 Months   Mode or (Route) Nasal cannula   Liters per Minute 2   Frequency Continuous (stationary and portable oxygen unit needed)   Oxygen conserving device Yes   Oxygen delivery system Gas      06/26/22 1120           Allergies  Allergen Reactions   Influenza Vac Split Quad Shortness Of Breath   Ditropan Xl [Oxybutynin] Other (See Comments)    Unknown reaction   Simvastatin Rash    Follow-up Information     [facility] Follow up.   Specialty: Home Health Services Why: Agency will call you to set up apt Contact information: [address] [place] [address] [phone]         Sigmund Hazel, MD. Go on 07/10/2022.   Specialty: Family Medicine Why: @11 : information: [address] [place] [phone]         [facility]. Go in 11 day(s).   Specialty: Cardiology Why: Hospital follow up PLEASE bring a current medication list to appointment FREE valet parking, Entrance C, off [phone] information: [address] [place] [phone]        [facility]  Follow up.   Why: home oxygen Contact information: [address] [place] [phone]  The results of significant diagnostics from this hospitalization (including imaging, microbiology, ancillary and laboratory) are listed below for reference.    Significant Diagnostic Studies: DG Chest Port 1 View  Result Date: 06/24/2022 CLINICAL DATA:  Dyspnea and high blood pressure EXAM: PORTABLE CHEST 1 VIEW COMPARISON:  Radiographs 03/26/2022 FINDINGS: Since 03/26/2022, mild increase in interstitial prominence and pulmonary vascular congestion suggestive of edema. Question small left pleural effusion. No focal consolidation or pneumothorax. Stable cardiomediastinal silhouette given patient rotation. No acute osseous abnormality. IMPRESSION: Mild increase in interstitial prominence and pulmonary vascular congestion suggestive of edema. Electronically Signed   By: Placido Sou M.D.   On: 06/24/2022 22:05    Microbiology: Recent Results (from the past 240 hour(s))  SARS Coronavirus 2 by RT PCR (hospital order, performed in [facility] hospital lab) *cepheid single result test* Anterior Nasal Swab     Status: None   Collection Time: 06/24/22 10:33 PM   Specimen: Anterior Nasal Swab  Result Value Ref Range Status   SARS Coronavirus 2 by RT PCR NEGATIVE NEGATIVE Final    Comment: (NOTE) SARS-CoV-2 target nucleic acids are NOT DETECTED.  The SARS-CoV-2 RNA is generally detectable in upper and lower respiratory specimens during the acute phase of infection. The lowest concentration of SARS-CoV-2 viral copies this assay can detect is 250 copies / mL. A negative result does not preclude SARS-CoV-2 infection and should not be used as the sole basis for treatment or other patient management decisions.  A negative result may occur with improper specimen collection / handling, submission of specimen other than nasopharyngeal swab, presence of viral mutation(s)  within the areas targeted by this assay, and inadequate number of viral copies (<250 copies / mL). A negative result must be combined with clinical observations, patient history, and epidemiological information.  Fact Sheet for Patients:   https://www.patel.info/  Fact Sheet for Healthcare Providers: https://Jorie Zee.com/  This test is not yet approved or  cleared by the [place] FDA and has been authorized for detection and/or diagnosis of SARS-CoV-2 by FDA under an Emergency Use Authorization (EUA).  This EUA will remain in effect (meaning this test can be used) for the duration of the COVID-19 declaration under Section 564(b)(1) of the Act, 21 U.S.C. section 360bbb-3(b)(1), unless the authorization is terminated or revoked sooner.  Performed at [facility], [place] [address] [place], [place] [address]      Labs: Basic Metabolic Panel: Recent Labs  Lab 06/24/22 2136 06/26/22 0740  NA 141 142  K 3.0* 4.3  CL 102 98  CO2 29 33*  GLUCOSE 125* 80  BUN 13 22  CREATININE 0.90 0.95  CALCIUM 9.0 9.3  MG  --  1.9   Liver Function Tests: No results for input(s): "AST", "ALT", "ALKPHOS", "BILITOT", "PROT", "ALBUMIN" in the last 168 hours. No results for input(s): "LIPASE", "AMYLASE" in the last 168 hours. No results for input(s): "AMMONIA" in the last 168 hours. CBC: Recent Labs  Lab 06/24/22 2136  WBC 9.9  HGB 10.6*  HCT 34.1*  MCV 88.8  PLT 302   Cardiac Enzymes: No results for input(s): "CKTOTAL", "CKMB", "CKMBINDEX", "TROPONINI" in the last 168 hours. BNP: BNP (last 3 results) Recent Labs    06/24/22 2136  BNP 430.3*    ProBNP (last 3 results) No results for input(s): "PROBNP" in the last 8760 hours.  CBG: No results for input(s): "GLUCAP" in the last 168 hours.     Signed:  Kayleen Memos, MD [facility] 06/26/2022,  4:58 PM

## 2022-06-26 NOTE — TOC Initial Note (Signed)
Transition of Care [facility]) - Initial/Assessment Note    Patient Details  Name: Julia Hernandez MRN: [id number] Date of Birth: 1936-08-31  Transition of Care [facility]) CM/SW Contact:    Leone Haven, RN Phone Number: 06/26/2022, 8:57 AM  Clinical Narrative:                 NCM spoke with patient at the bedside, she states she would like to have a HHRN , NCM offered choice, she does not have a preference,   NCM made referral to Amy with [facility], since patient said she thought she had them before, Amy states she is not sure she can take patient.  NCM made referral to [facility] with [facility] , she is able to take referral.  Soc will begin 24 to 48 hrs potst dc. Patient states her daughter will transport her home at dc.  Her daughter is her support person also.  NCM will cont to follow she may need home oxygen at dc, she states she does not have a preference for the DME company.    Expected Discharge Plan: Home w Home Health Services Barriers to Discharge: No Barriers Identified   Patient Goals and CMS Choice Patient states their goals for this hospitalization and ongoing recovery are:: return home CMS Medicare.gov Compare Post Acute Care list provided to:: Patient Choice offered to / list presented to : Patient  Expected Discharge Plan and Services Expected Discharge Plan: Home w Home Health Services   Discharge Planning Services: CM Consult Post Acute Care Choice: Home Health Living arrangements for the past 2 months: Apartment Expected Discharge Date: 06/26/22                         HH Arranged: RN, Disease Management HH Agency: [facility] Date [facility] Agency Contacted: 06/26/22 Time HH Agency Contacted: [phone] Representative spoke with at [facility] Agency: Tresa Endo  Prior Living Arrangements/Services Living arrangements for the past 2 months: Apartment Lives with:: Self Patient language and need for interpreter reviewed:: Yes Do you feel safe going back to the place where you  live?: Yes      Need for Family Participation in Patient Care: Yes (Comment) Care giver support system in place?: Yes (comment) Current home services: DME (has walker, rollator, and a cane) Criminal Activity/Legal Involvement Pertinent to Current Situation/Hospitalization: No - Comment as needed  Activities of Daily Living      Permission Sought/Granted                  Emotional Assessment Appearance:: Appears stated age Attitude/Demeanor/Rapport: Engaged Affect (typically observed): Appropriate Orientation: : Oriented to Self, Oriented to Place, Oriented to  Time, Oriented to Situation Alcohol / Substance Use: Not Applicable Psych Involvement: No (comment)  Admission diagnosis:  Hypokalemia [E87.6] Precordial chest pain [R07.2] Smoking [F17.200] COPD exacerbation (HCC) [J44.1] Acute respiratory failure with hypoxia (HCC) [J96.01] Essential hypertension [I10] Acute on chronic combined systolic and diastolic CHF (congestive heart failure) (HCC) [I50.43] Acute on chronic respiratory failure with hypoxia (HCC) [J96.21] Patient Active Problem List   Diagnosis Date Noted   Acute respiratory failure with hypoxia (HCC) 06/25/2022   Acute on chronic diastolic CHF (congestive heart failure) (HCC) 06/25/2022   Elevated troponin 06/25/2022   Hypokalemia 06/25/2022   Chronic pain 06/25/2022   Compression fracture of spine (HCC) 06/25/2022   Anxiety 06/25/2022   Malnutrition of moderate degree 03/24/2022   DNR (do not resuscitate)/DNI(Do Not Intubate) 03/20/2022   AKI (acute kidney  injury) (HCC) 03/20/2022   Normocytic anemia 03/19/2022   Tobacco abuse 03/19/2022   Stage 3a chronic kidney disease (CKD) (HCC) 08/20/2021   Presence of coronary angioplasty implant and graft 08/20/2021   Wedge compression fracture of unspecified lumbar vertebra, initial encounter for closed fracture (HCC) 08/20/2021   Nonrheumatic mitral valve regurgitation 11/11/2018   COPD with acute  exacerbation (HCC) 11/10/2018   PCP:  Sigmund Hazel, MD Pharmacy:   [facility] - [place], [place] - [address] [place] [address] Phone: [phone] Fax: [phone]     Social Determinants of Health (SDOH) Interventions    Readmission Risk Interventions    06/26/2022    8:54 AM  Readmission Risk Prevention Plan  Transportation Screening Complete  PCP or Specialist Appt within 3-5 Days Complete  HRI or Home Care Consult Complete  Social Work Consult for Recovery Care Planning/Counseling Complete  Palliative Care Screening Not Applicable  Medication Review Oceanographer) Complete

## 2022-06-26 NOTE — Plan of Care (Signed)

## 2022-06-26 NOTE — Progress Notes (Signed)
SATURATION QUALIFICATIONS: (This note is used to comply with regulatory documentation for home oxygen)  Patient Saturations on Room Air at Rest = 85%  Patient Saturations on Room Air while Ambulating = 87%  Patient Saturations on 1 Liters of oxygen while Ambulating = 91%  Please briefly explain why patient needs home oxygen: Desats

## 2022-06-26 NOTE — Evaluation (Signed)
Occupational Therapy Evaluation Patient Details Name: Julia Hernandez MRN: [id number] DOB: [birth year] Today's Date: 06/26/2022   History of Present Illness 86 yo female with onset of SOB and LE edema had stopped her lasix, now admitted on 9/13 for management.  PMHx:  CHF, EF 55%, mitral valve regurg, CAD, COPD, CKD, osteoporosis, compression fractures Thoracic spine, chronic LE edema   Clinical Impression   Pt in bed upon therapy arrival. See below regarding nasal cannula and SpO2 levels. Discussed current routines and habits at home in relation to needing home oxygen and change to a heart healthy diet. Patient is currently functioning at or very close to baseline for ADL tasks. She has family living close by and check on her frequently daily. Pt verbalizes understanding of having more assist when she returns home if needed until she feels stronger. At this time, no further OT services are needed.       Recommendations for follow up therapy are one component of a multi-disciplinary discharge planning process, led by the attending physician.  Recommendations may be updated based on patient status, additional functional criteria and insurance authorization.   Follow Up Recommendations  No OT follow up    Assistance Recommended at Discharge PRN  Patient can return home with the following Assist for transportation;Assistance with cooking/housework;Help with stairs or ramp for entrance    Functional Status Assessment  Patient has had a recent decline in their functional status and demonstrates the ability to make significant improvements in function in a reasonable and predictable amount of time.  Equipment Recommendations  None recommended by OT       Precautions / Restrictions Precautions Precautions: Fall Precaution Comments: monitor O2 sats. New oxygen user 1.5L Restrictions Weight Bearing Restrictions: No      Mobility Bed Mobility Overal bed mobility:  (see PT eval)     Patient  Response: Cooperative  Transfers Overall transfer level:  (See PT eval)        Balance Overall balance assessment: Needs assistance (see PT eval)         ADL either performed or assessed with clinical judgement   ADL Overall ADL's : Modified independent;At baseline         Vision Baseline Vision/History: 1 Wears glasses Ability to See in Adequate Light: 0 Adequate Patient Visual Report: No change from baseline Vision Assessment?: No apparent visual deficits     Perception Perception Perception: Within Functional Limits   Praxis Praxis Praxis: Intact    Pertinent Vitals/Pain Pain Assessment Pain Assessment: No/denies pain     Hand Dominance Right   Extremity/Trunk Assessment Upper Extremity Assessment Upper Extremity Assessment: Overall WFL for tasks assessed   Lower Extremity Assessment Lower Extremity Assessment: Defer to PT evaluation   Cervical / Trunk Assessment Cervical / Trunk Assessment: Kyphotic   Communication Communication Communication: No difficulties   Cognition Arousal/Alertness: Awake/alert Behavior During Therapy: WFL for tasks assessed/performed Overall Cognitive Status: Within Functional Limits for tasks assessed       General Comments  Upon arrival, pt's nasal cannula out of nose and SpO2 level 84% on room air. Nasal cannula placed on pt correctly and tightened to prevent slipping off. At 1L O2 pt's SpO2 only increased to 88%. increased to 1.5L O2 and pt's level increased to 92-93%.            Home Living Family/patient expects to be discharged to:: Private residence Living Arrangements: Alone Available Help at Discharge: Family;Available PRN/intermittently Type of Home: House Home Access: Level entry  Home Layout: One level     Bathroom Shower/Tub: Chief Strategy Officer: Standard     Home Equipment: Agricultural consultant (2 wheels);Rollator (4 wheels);Shower seat;Grab bars - tub/shower;Cane - single point    Additional Comments: declines HH therapy;  family available every day to check on her      Prior Functioning/Environment Prior Level of Function : Independent/Modified Independent;Driving             Mobility Comments: No AD needed for mobility ADLs Comments: Daughter has assisted with bringing meals recently. She loves to dance.        OT Problem List: Cardiopulmonary status limiting activity         OT Goals(Current goals can be found in the care plan section) Acute Rehab OT Goals Patient Stated Goal: to go home  OT Frequency:  1X visit       AM-PAC OT "6 Clicks" Daily Activity     Outcome Measure Help from another person eating meals?: None Help from another person taking care of personal grooming?: None Help from another person toileting, which includes using toliet, bedpan, or urinal?: None Help from another person bathing (including washing, rinsing, drying)?: None Help from another person to put on and taking off regular upper body clothing?: None Help from another person to put on and taking off regular lower body clothing?: None 6 Click Score: 24   End of Session Equipment Utilized During Treatment: Oxygen  Activity Tolerance: Patient tolerated treatment well Patient left: in bed;with call bell/phone within reach  OT Visit Diagnosis: Muscle weakness (generalized) (M62.81)                Time: 1130-1145 OT Time Calculation (min): 15 min Charges:  OT General Charges $OT Visit: 1 Visit OT Evaluation $OT Eval Moderate Complexity: 1 Mod  [employer], OTR/L,CBIS  Supplemental OT - [facility] and [facility]   Dencil Cayson, Charisse March 06/26/2022, 1:28 PM

## 2022-07-07 ENCOUNTER — Ambulatory Visit (HOSPITAL_COMMUNITY)
Admit: 2022-07-07 | Discharge: 2022-07-07 | Disposition: A | Discharge status: Home / Self Care | Payer: Medicare HMO | Attending: Adult Health | Admitting: Adult Health

## 2022-07-07 ENCOUNTER — Telehealth (HOSPITAL_COMMUNITY): Payer: Self-pay

## 2022-07-07 VITALS — BP 136/70 | HR 83 | Wt 95.2 lb

## 2022-07-07 DIAGNOSIS — J449 Chronic obstructive pulmonary disease, unspecified: Secondary | ICD-10-CM | POA: Diagnosis not present

## 2022-07-07 DIAGNOSIS — T148XXA Other injury of unspecified body region, initial encounter: Secondary | ICD-10-CM | POA: Diagnosis not present

## 2022-07-07 DIAGNOSIS — N1831 Chronic kidney disease, stage 3a: Secondary | ICD-10-CM | POA: Diagnosis not present

## 2022-07-07 DIAGNOSIS — I130 Hypertensive heart and chronic kidney disease with heart failure and stage 1 through stage 4 chronic kidney disease, or unspecified chronic kidney disease: Secondary | ICD-10-CM | POA: Diagnosis not present

## 2022-07-07 DIAGNOSIS — I5032 Chronic diastolic (congestive) heart failure: Secondary | ICD-10-CM | POA: Insufficient documentation

## 2022-07-07 DIAGNOSIS — Z66 Do not resuscitate: Secondary | ICD-10-CM | POA: Insufficient documentation

## 2022-07-07 DIAGNOSIS — M810 Age-related osteoporosis without current pathological fracture: Secondary | ICD-10-CM | POA: Insufficient documentation

## 2022-07-07 DIAGNOSIS — I2510 Atherosclerotic heart disease of native coronary artery without angina pectoris: Secondary | ICD-10-CM | POA: Insufficient documentation

## 2022-07-07 DIAGNOSIS — X58XXXA Exposure to other specified factors, initial encounter: Secondary | ICD-10-CM | POA: Insufficient documentation

## 2022-07-07 LAB — BASIC METABOLIC PANEL
Anion gap: 6 (ref 5–15)
BUN: 20 mg/dL (ref 8–23)
CO2: 30 mmol/L (ref 22–32)
Calcium: 9.6 mg/dL (ref 8.9–10.3)
Chloride: 102 mmol/L (ref 98–111)
Creatinine, Ser: 1.23 mg/dL — ABNORMAL HIGH (ref 0.44–1.00)
GFR, Estimated: 43 mL/min — ABNORMAL LOW (ref >60)
Glucose, Bld: 89 mg/dL (ref 70–99)
Potassium: 5.1 mmol/L (ref 3.5–5.1)
Sodium: 138 mmol/L (ref 135–145)

## 2022-07-07 MED ORDER — SPIRONOLACTONE 25 MG PO TABS: 12.5000 mg | ORAL_TABLET | Freq: Every day | ORAL | 0 refills | Status: DC

## 2022-07-07 NOTE — Telephone Encounter (Signed)
Call attempted to confirm HV TOC appt today at 3pm. HIPPA appropriate VM left with callback number.   Ophie Burrowes, MSN, RN Heart Failure Nurse Navigator  

## 2022-07-07 NOTE — Patient Instructions (Signed)
START Spironolactone 12.5 mg (1/2 tab) Daily  Labs done today, your results will be available in MyChart, we will contact you for abnormal readings.  Your physician recommends that you return for lab work in: 1 week, we have provided you a prescription to have this done by home health  Do the following things EVERYDAY: Weigh yourself in the morning before breakfast. Write it down and keep it in a log. Take your medicines as prescribed Eat low salt foods--Limit salt (sodium) to 2000 mg per day.  Stay as active as you can everyday Limit all fluids for the day to less than 2 liters  Thank you for allowing [place] to provider your heart failure care after your recent hospitalization. Please follow-up with Dr Sabra Heck as scheduled  If you have any questions, issues, or concerns before your next appointment please call our office at [phone], opt. [phone] and leave a message for the triage nurse.

## 2022-07-07 NOTE — Progress Notes (Signed)
HEART & VASCULAR TRANSITION OF CARE CONSULT NOTE     Referring Physician: Dr Nevada Crane Primary Care: Dr Lucile Shutters Primary Cardiologist: Dr Geanie Berlin  HPI: Referred to clinic by Dr Nevada Crane for heart failure consultation.   Julia Hernandez is a 86 year old with history of HFpEF, non rheumatic mitral regurgitation, osteoporosis, compression fracture, IDA, CAD, COPD, CKD IIIa, and DNR.   Admitted 06/24/22 with A/C HFpEF and Acute Hypoxic Respiratory Failure. Diuresed with IV lasix and transitioned to 20 mg po lasix as needed. Discharge weight 101 pounds.   She presents today with her daughter. Overall feeling fine. Mild exertional dyspnea. Denies PND/Orthopnea. Limited by back pain. Walks in her apartment. Appetite ok. No fever or chills. Weight at home 94-95 pounds. She has home health RN/OT a few days a week. Taking all medications. She tells me she has had problems with hypokalemia. She has not taken lasix since discharge. Her daughter drives her to appointments.   Cardiac Testing  Echo 03/20/22 EF 55% Grade IDD,  RV normal, and MV is normal.  Echo 2020 EF 55-60%   Cath 2020 - [facility]  proximal LAD 95% stenosis,  first diagonal 65% stenosis,  mid left circumflex 50% stenosis, mid to distal left circumflex 50% stenosis,  second marginal 50% stenosis.  The proximal LAD was stented with drug-eluting stent. Review of Systems: [y] = yes, [ ]  = no   General: Weight gain [ ] ; Weight loss [ ] ; Anorexia [ ] ; Fatigue [ ] ; Fever [ ] ; Chills [ ] ; Weakness [ ]   Cardiac: Chest pain/pressure [ ] ; Resting SOB [ ] ; Exertional SOB [ Y]; Orthopnea [ ] ; Pedal Edema [ ] ; Palpitations [ ] ; Syncope [ ] ; Presyncope [ ] ; Paroxysmal nocturnal dyspnea[ ]   Pulmonary: Cough [ ] ; Wheezing[ ] ; Hemoptysis[ ] ; Sputum [ ] ; Snoring [ ]   GI: Vomiting[ ] ; Dysphagia[ ] ; Melena[ ] ; Hematochezia [ ] ; Heartburn[ ] ; Abdominal pain [ ] ; Constipation [ ] ; Diarrhea [ ] ; BRBPR [ ]   GU: Hematuria[ ] ; Dysuria [ ] ; Nocturia[ ]   Vascular:  Pain in legs with walking [ ] ; Pain in feet with lying flat [ ] ; Non-healing sores [ ] ; Stroke [ ] ; TIA [ ] ; Slurred speech [ ] ;  Neuro: Headaches[ ] ; Vertigo[ ] ; Seizures[ ] ; Paresthesias[ ] ;Blurred vision [ ] ; Diplopia [ ] ; Vision changes [ ]   Ortho/Skin: Arthritis [ ] ; Joint pain [ Y]; Muscle pain [ ] ; Joint swelling [ ] ; Back Pain [Y ]; Rash [ ]   Psych: Depression[ ] ; Anxiety[ ]   Heme: Bleeding problems [ ] ; Clotting disorders [ ] ; Anemia [ ]   Endocrine: Diabetes [ ] ; Thyroid dysfunction[ ]    Past Medical History:  Diagnosis Date   Asthma    Hypertension     Current Outpatient Medications  Medication Sig Dispense Refill   acetaminophen (TYLENOL) 500 MG tablet Take 500 mg by mouth 5 (five) times daily.     acyclovir ointment (ZOVIRAX) 5 % Apply 1 application topically 3 (three) times daily as needed (outbreaks).     aspirin EC 81 MG tablet Take 1 tablet (81 mg total) by mouth daily. Swallow whole. 30 tablet 0   diazepam (VALIUM) 5 MG tablet Take 1 tablet (5 mg total) by mouth daily as needed for anxiety. 3 tablet 0   docusate sodium (COLACE) 100 MG capsule Take 1 capsule (100 mg total) by mouth 2 (two) times daily. (Patient taking differently: Take 100 mg by mouth 2 (two) times daily as needed for mild constipation.)  60 capsule 2   ferrous sulfate 325 (65 FE) MG tablet Take 1 tablet (325 mg total) by mouth 2 (two) times daily with a meal. (Patient taking differently: Take 325 mg by mouth daily with breakfast.) 60 tablet 0   furosemide (LASIX) 20 MG tablet Take 1 tablet (20 mg total) by mouth daily as needed (weight gain of >3 lbs in 1 day or >5 lbs in 1 week). 30 tablet 0   Ipratropium-Albuterol (COMBIVENT RESPIMAT) 20-100 MCG/ACT AERS respimat Inhale 1 puff into the lungs 4 (four) times daily as needed for shortness of breath.     magic mouthwash w/lidocaine SOLN Take 5 mLs by mouth 4 (four) times daily as needed for mouth pain.     metoprolol succinate (TOPROL-XL) 25 MG 24 hr tablet  Take 12.5 mg by mouth at bedtime.     oxyCODONE (OXY IR/ROXICODONE) 5 MG immediate release tablet Take 0.5 tablets (2.5 mg total) by mouth every 4 (four) hours as needed for moderate pain. (Patient taking differently: Take 5 mg by mouth every 4 (four) hours as needed for moderate pain.) 15 tablet 0   pantoprazole (PROTONIX) 40 MG tablet Take 1 tablet (40 mg total) by mouth daily. (Patient taking differently: Take 40 mg by mouth daily as needed (for acid reflux).) 30 tablet 0   potassium chloride SA (KLOR-CON M) 20 MEQ tablet Take 1 tablet (20 mEq total) by mouth daily for 14 days. 14 tablet 0   sertraline (ZOLOFT) 100 MG tablet Take 100 mg by mouth at bedtime.     SYMBICORT 160-4.5 MCG/ACT inhaler Inhale 2 puffs into the lungs 2 (two) times daily.     B Complex-C (B-COMPLEX WITH VITAMIN C) tablet Take 1 tablet by mouth daily. (Patient not taking: Reported on 07/07/2022) 30 tablet 0   No current facility-administered medications for this encounter.    Allergies  Allergen Reactions   Influenza Vac Split Quad Shortness Of Breath   Ditropan Xl [Oxybutynin] Other (See Comments)    Unknown reaction   Simvastatin Rash      Social History   Socioeconomic History   Marital status: Single    Spouse name: Not on file   Number of children: 1   Years of education: Not on file   Highest education level: High school graduate  Occupational History   Occupation: Retired  Tobacco Use   Smoking status: Every Day    Packs/day: 0.50    Years: 60.00    Total pack years: 30.00    Types: Cigarettes   Smokeless tobacco: Never  Vaping Use   Vaping Use: Never used  Substance and Sexual Activity   Alcohol use: No   Drug use: Never   Sexual activity: Not on file  Other Topics Concern   Not on file  Social History Narrative   Not on file   Social Determinants of Health   Financial Resource Strain: Low Risk  (06/26/2022)   Overall Financial Resource Strain (CARDIA)    Difficulty of Paying Living  Expenses: Not hard at all  Food Insecurity: No Food Insecurity (06/26/2022)   Hunger Vital Sign    Worried About Running Out of Food in the Last Year: Never true    [place] in the Last Year: Never true  Transportation Needs: No Transportation Needs (06/26/2022)   PRAPARE - Hydrologist (Medical): No    Lack of Transportation (Non-Medical): No  Physical Activity: Not on file  Stress: Not  on file  Social Connections: Not on file  Intimate Partner Violence: Not on file     No family history on file.  Vitals:   07/07/22 1450  BP: 136/70  Pulse: 83  SpO2: 92%  Weight: 43.2 kg (95 lb 3.2 oz)   Wt Readings from Last 3 Encounters:  07/07/22 43.2 kg (95 lb 3.2 oz)  06/26/22 46.2 kg (101 lb 13.6 oz)  03/29/22 49.7 kg (109 lb 9.1 oz)    PHYSICAL EXAM: General:  Walked in the clinic.No respiratory difficulty HEENT: normal Neck: supple. no JVD. Carotids 2+ bilat; no bruits. No lymphadenopathy or thryomegaly appreciated. Cor: PMI nondisplaced. Regular rate & rhythm. No rubs, gallops or murmurs. Lungs: clear Abdomen: soft, nontender, nondistended. No hepatosplenomegaly. No bruits or masses. Good bowel sounds. Extremities: no cyanosis, clubbing, rash, edema Neuro: alert & oriented x 3, cranial nerves grossly intact. moves all 4 extremities w/o difficulty. Affect pleasant.  ASSESSMENT & PLAN: 1. HFpEF  03/2022 Echo LVEF 55% grade I dd, RV normal.   NYHA III , chronically  GDMT  Diuretic- Appear euvolemic. Continue lasix 20 mg as needed for 3 pound weight gain or lower extremity edema.   BB- Continue current dose.  Ace/ARB/ARNI MRA-Add 12.5 mg spiro daily . May be able to stop K supplement.  SGLT2i- HOld off. Consider at f/u with Cardiology.   Check BMET today and in 7 days.   2. CAD -[facility]. Multivessel CAD. Had DES to proximal LAD.  - Off brillinta due to IDA - On aspirin + bb+ statin  - No chest pain.   3. CKD Stage IIIa Check  BMET   4. DNR    Referred to HFSW (PCP, Medications, Transportation, ETOH Abuse, Drug Abuse, Insurance, Financial ):  No Refer to Pharmacy:  No Refer to Home Health:  No Refer to Advanced Heart Failure Clinic: no  Refer to General Cardiology:  No  Follow up as needed.   Julia Osbourn NP-C  2:54 PM

## 2023-01-08 ENCOUNTER — Other Ambulatory Visit (HOSPITAL_COMMUNITY): Payer: Self-pay | Admitting: *Deleted

## 2023-01-12 ENCOUNTER — Encounter (HOSPITAL_COMMUNITY): Payer: Medicare HMO

## 2023-03-09 IMAGING — CT CT ABD-PELV W/ CM
2 of 5 series · 16 of 46 positions shown, 18 images · IV contrast (agent unspecified)
Comparison: 10/28/2018

CLINICAL DATA: Abdominal pain, acute, nonlocalized

EXAM:
CT ABDOMEN AND PELVIS WITH CONTRAST
TECHNIQUE: Multidetector CT imaging of the abdomen and pelvis was performed
using the standard protocol following bolus administration of
intravenous contrast.

[Series 3: a/p w/ 5mm · axial · 0.63mm/px · z∈[+1180,+1505]mm · 13 of 75 slices shown, 15 images]
[im 5/75  soft-tissue]
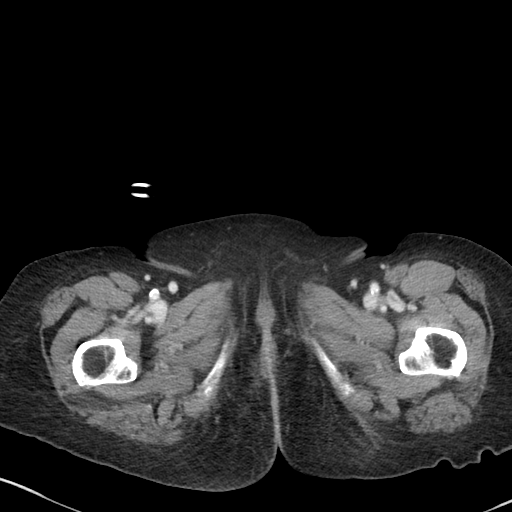
[im 5/75  bone]
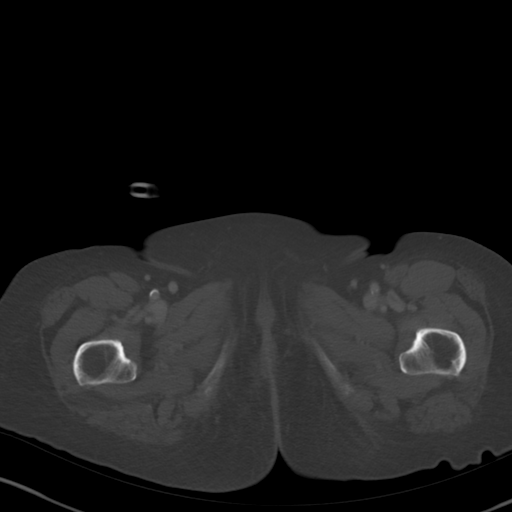
[im 9/75  soft-tissue]
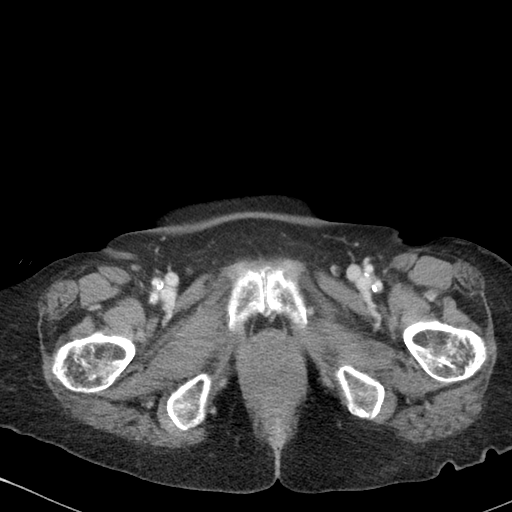
[im 17/75  soft-tissue]
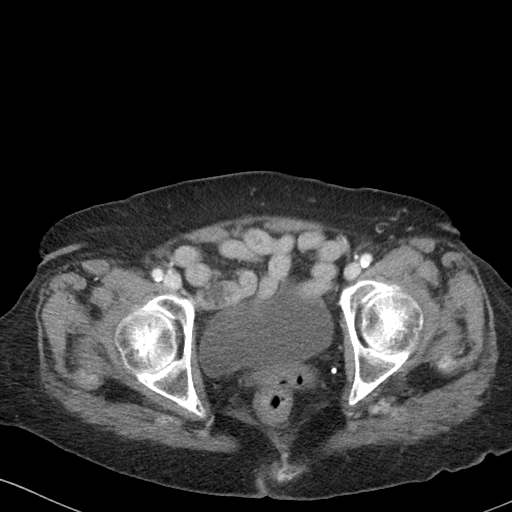
[im 21/75  soft-tissue]
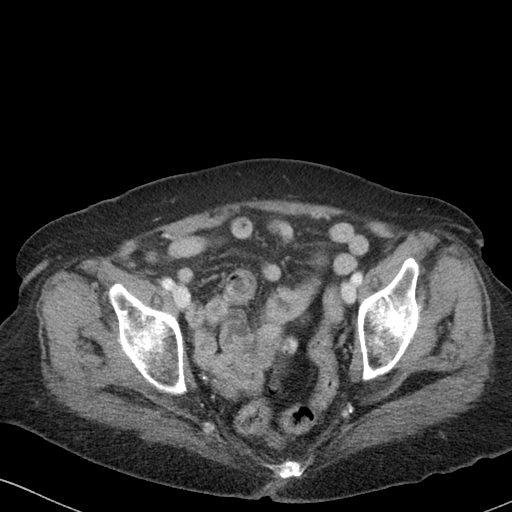
[im 25/75  soft-tissue]
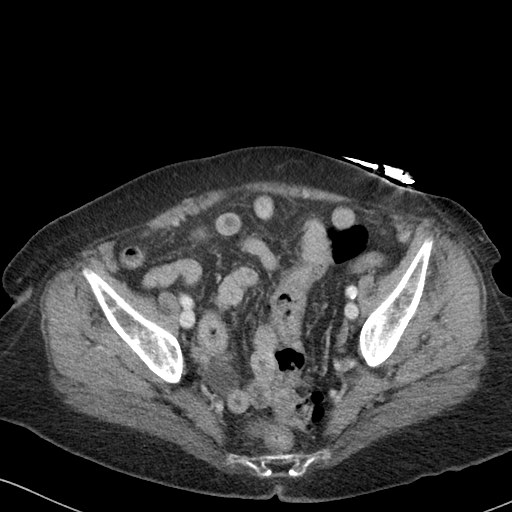
[im 33/75  soft-tissue]
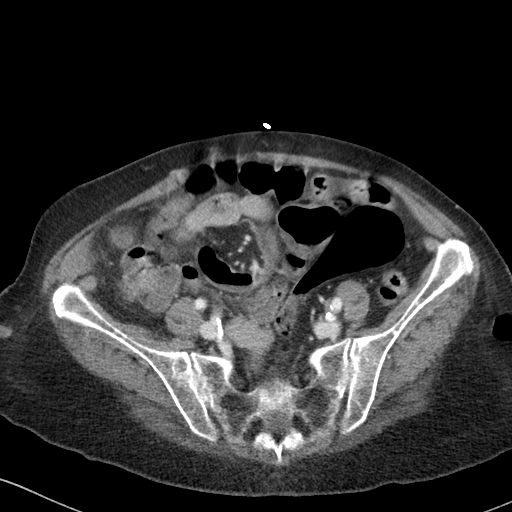
[im 38/75  soft-tissue]
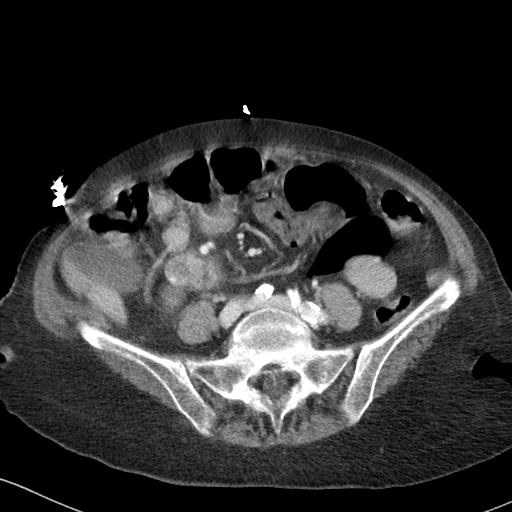
[im 42/75  soft-tissue]
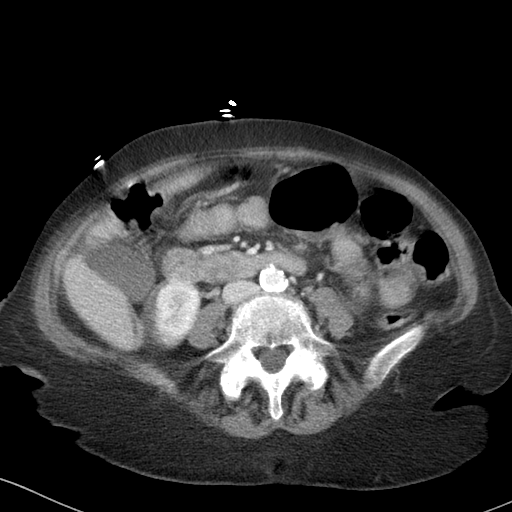
[im 50/75  soft-tissue]
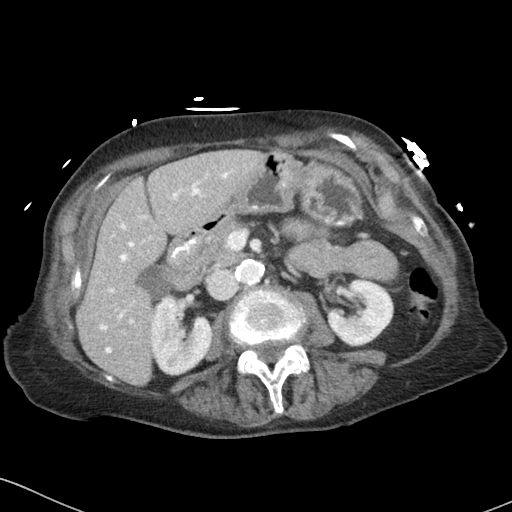
[im 50/75  bone]
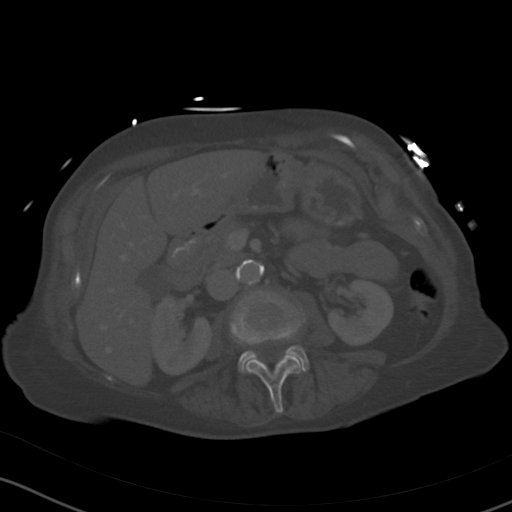
[im 54/75  soft-tissue]
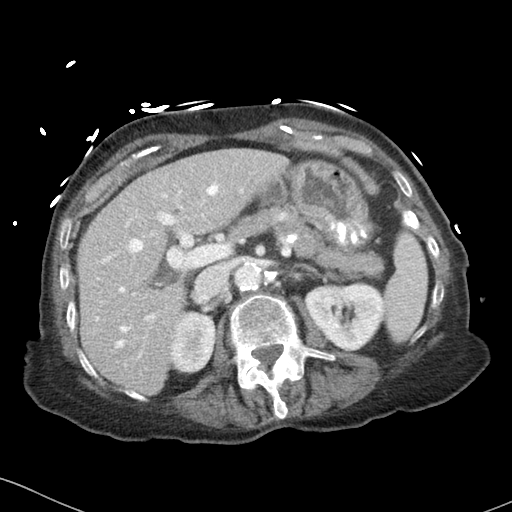
[im 58/75  soft-tissue]
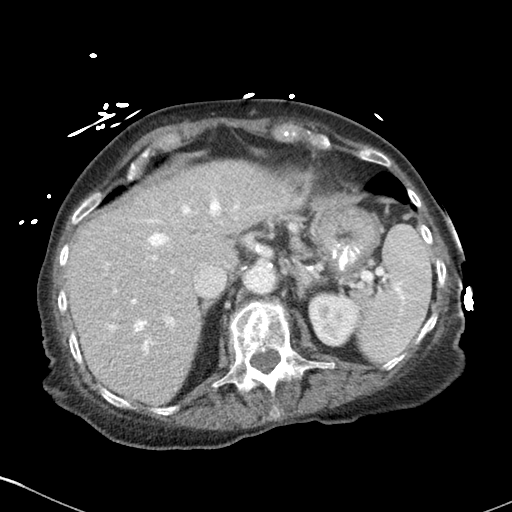
[im 66/75  soft-tissue]
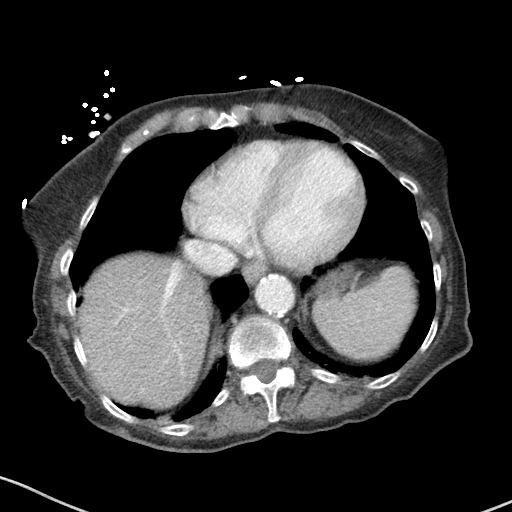
[im 70/75  soft-tissue]
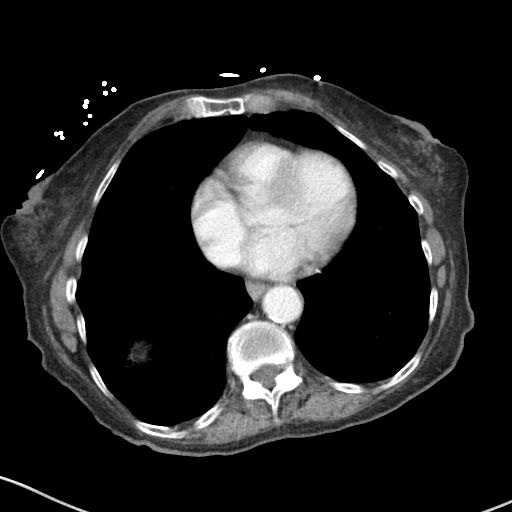

[Series 6: a/p w/ cor · coronal · 0.73mm/px · 3 of 149 slices shown]
[im 50/149  soft-tissue]
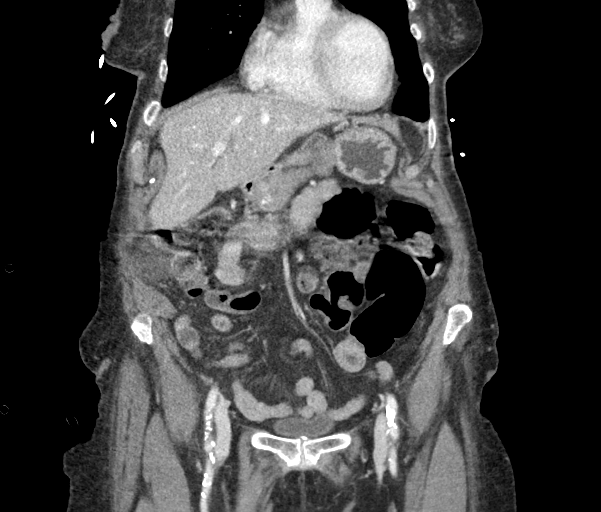
[im 66/149  soft-tissue]
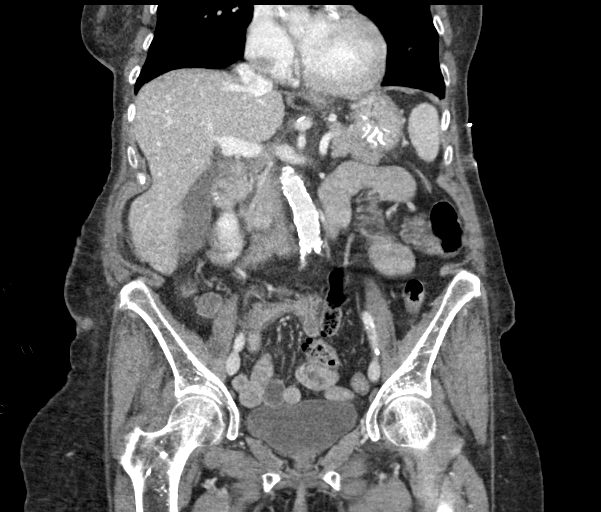
[im 83/149  soft-tissue]
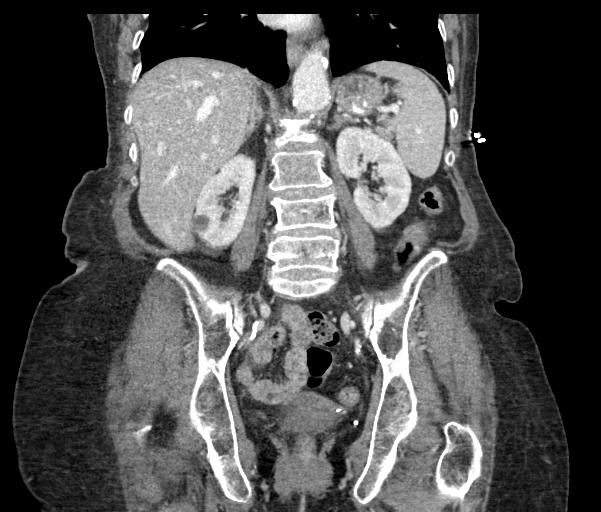

[16 of 46 positions shown; findings below may reference images not displayed]

RADIATION DOSE REDUCTION: This exam was performed according to the
departmental dose-optimization program which includes automated
exposure control, adjustment of the mA and/or kV according to
patient size and/or use of iterative reconstruction technique.

CONTRAST:  100mL OMNIPAQUE IOHEXOL 350 MG/ML SOLN
FINDINGS: Lower chest: See chest CT report.

Hepatobiliary: No focal hepatic abnormality. Gallbladder
unremarkable.

Pancreas: No focal abnormality or ductal dilatation.

Spleen: No focal abnormality.  Normal size.

Adrenals/Urinary Tract: Small cyst in the midpole of the right
kidney, stable. No stones or hydronephrosis. Adrenal glands and
urinary bladder unremarkable.

Stomach/Bowel: Stomach, large and small bowel grossly unremarkable.

Vascular/Lymphatic: Heavily calcified aorta and iliac vessels. No
evidence of aneurysm or adenopathy.

Reproductive: Prior hysterectomy.  No adnexal masses.

Other: No free fluid or free air.

Musculoskeletal: No acute bony abnormality. Chronic moderate
compression fracture at L2.
IMPRESSION: No acute findings in the abdomen or pelvis.

Heavily calcified aorta and iliac vessels.

## 2023-03-28 IMAGING — DX DG CHEST 1V PORT
1 series · 1 of 1 positions shown · non-contrast
Comparison: 03/07/2022

CLINICAL DATA: Nausea, vomiting, constipation chest pain

EXAM:
PORTABLE CHEST 1 VIEW

[chest ap]
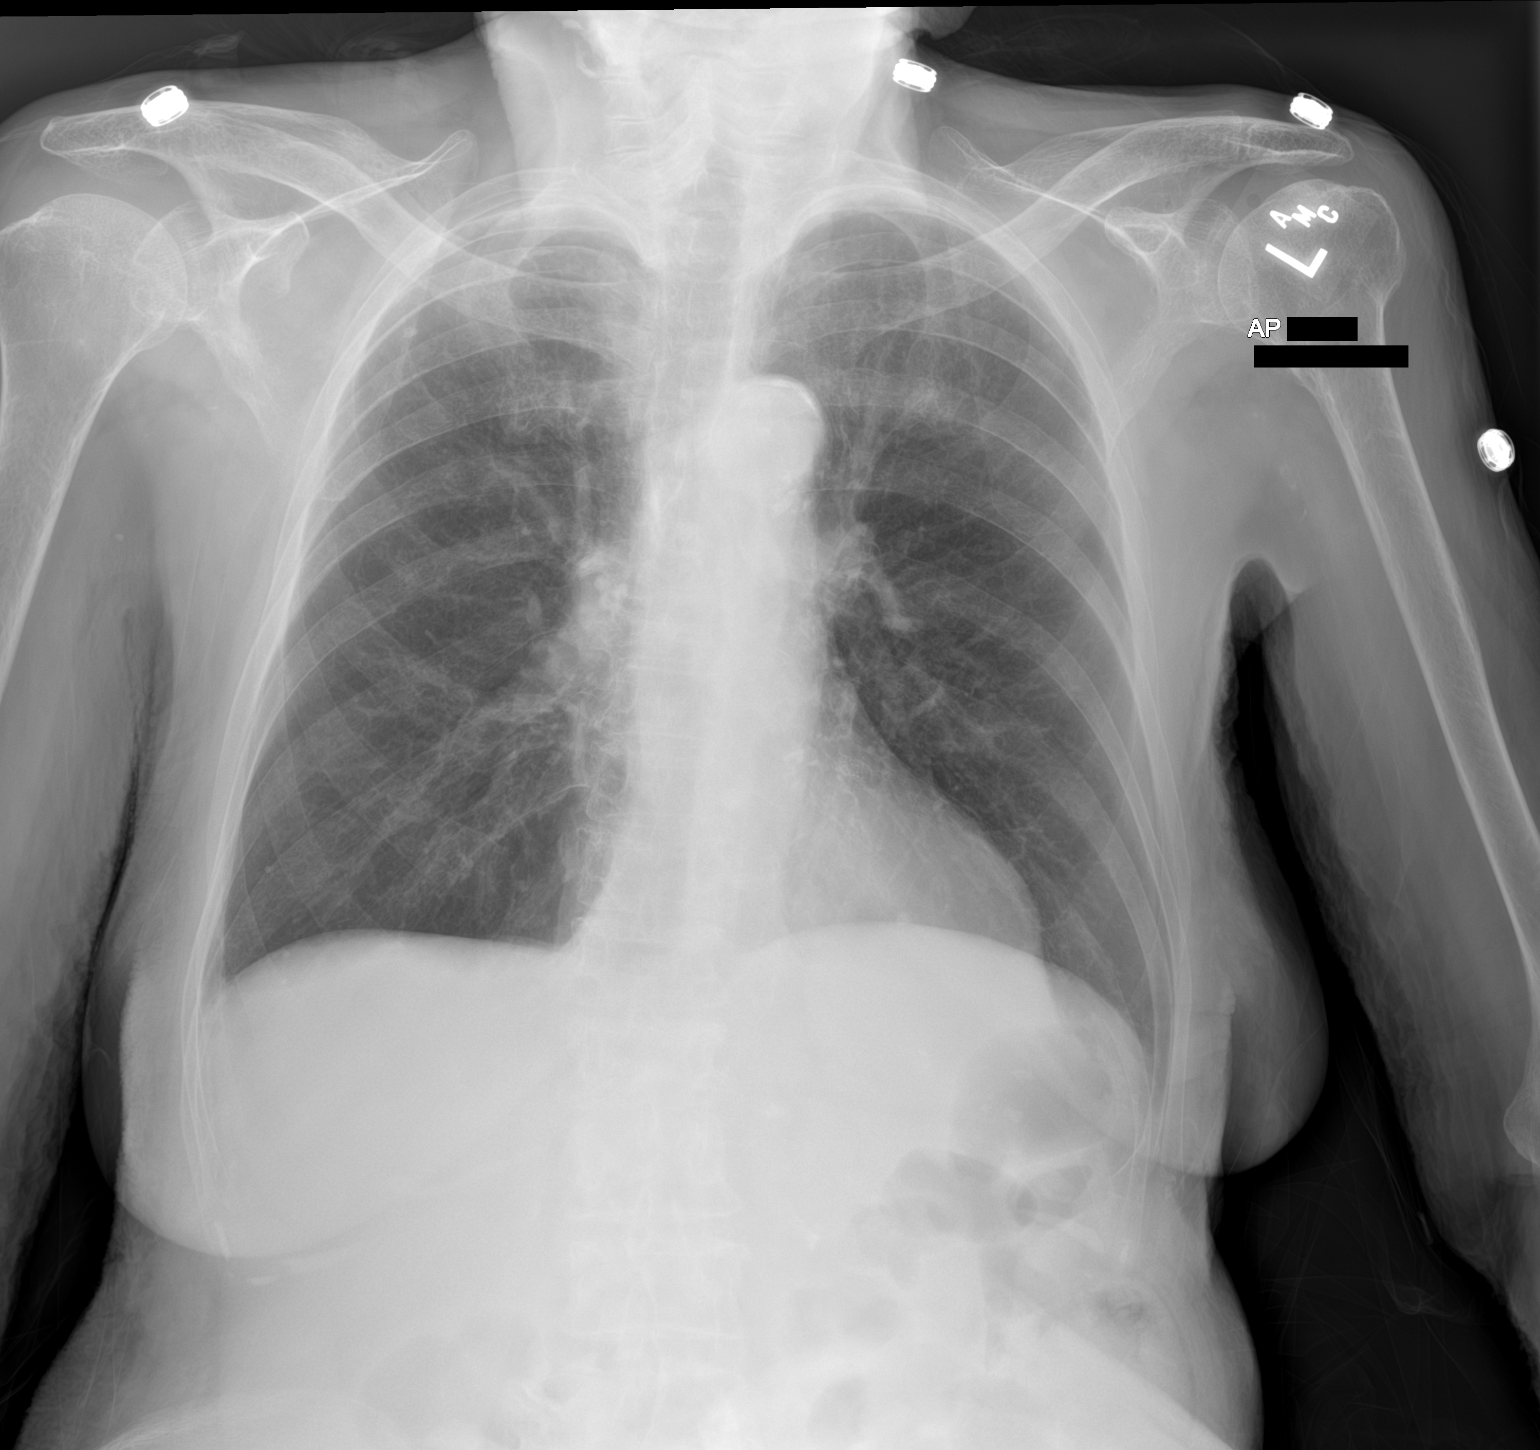

[1 of 1 positions shown; findings below may reference images not displayed]

FINDINGS: Cardiac and mediastinal contours are within normal limits. No focal
pulmonary opacity. No pleural effusion or pneumothorax. No acute
osseous abnormality.
IMPRESSION: No acute cardiopulmonary process.

## 2023-08-30 ENCOUNTER — Encounter (HOSPITAL_COMMUNITY): Payer: Self-pay

## 2023-08-30 ENCOUNTER — Other Ambulatory Visit: Payer: Self-pay

## 2023-08-30 ENCOUNTER — Inpatient Hospital Stay (HOSPITAL_COMMUNITY)
Admission: EM | Admit: 2023-08-30 | Discharge: 2023-09-04 | DRG: 189 | Disposition: A | Discharge status: Home / Self Care | Payer: Medicare HMO | Attending: Family Medicine | Admitting: Family Medicine

## 2023-08-30 ENCOUNTER — Emergency Department (HOSPITAL_COMMUNITY): Payer: Medicare HMO

## 2023-08-30 DIAGNOSIS — J690 Pneumonitis due to inhalation of food and vomit: Secondary | ICD-10-CM | POA: Diagnosis present

## 2023-08-30 DIAGNOSIS — J9601 Acute respiratory failure with hypoxia: Principal | ICD-10-CM | POA: Diagnosis present

## 2023-08-30 DIAGNOSIS — E440 Moderate protein-calorie malnutrition: Secondary | ICD-10-CM | POA: Diagnosis present

## 2023-08-30 DIAGNOSIS — N1831 Chronic kidney disease, stage 3a: Secondary | ICD-10-CM | POA: Diagnosis present

## 2023-08-30 DIAGNOSIS — Z720 Tobacco use: Secondary | ICD-10-CM | POA: Diagnosis present

## 2023-08-30 DIAGNOSIS — J189 Pneumonia, unspecified organism: Secondary | ICD-10-CM

## 2023-08-30 DIAGNOSIS — K219 Gastro-esophageal reflux disease without esophagitis: Secondary | ICD-10-CM | POA: Diagnosis present

## 2023-08-30 DIAGNOSIS — R509 Fever, unspecified: Secondary | ICD-10-CM | POA: Diagnosis not present

## 2023-08-30 DIAGNOSIS — E876 Hypokalemia: Secondary | ICD-10-CM | POA: Diagnosis present

## 2023-08-30 DIAGNOSIS — B349 Viral infection, unspecified: Secondary | ICD-10-CM | POA: Diagnosis present

## 2023-08-30 DIAGNOSIS — I5032 Chronic diastolic (congestive) heart failure: Secondary | ICD-10-CM | POA: Diagnosis present

## 2023-08-30 DIAGNOSIS — Z7982 Long term (current) use of aspirin: Secondary | ICD-10-CM

## 2023-08-30 DIAGNOSIS — Z7951 Long term (current) use of inhaled steroids: Secondary | ICD-10-CM

## 2023-08-30 DIAGNOSIS — Z79899 Other long term (current) drug therapy: Secondary | ICD-10-CM

## 2023-08-30 DIAGNOSIS — J9621 Acute and chronic respiratory failure with hypoxia: Secondary | ICD-10-CM | POA: Diagnosis not present

## 2023-08-30 DIAGNOSIS — I5033 Acute on chronic diastolic (congestive) heart failure: Secondary | ICD-10-CM

## 2023-08-30 DIAGNOSIS — E441 Mild protein-calorie malnutrition: Secondary | ICD-10-CM | POA: Diagnosis present

## 2023-08-30 DIAGNOSIS — M4850XA Collapsed vertebra, not elsewhere classified, site unspecified, initial encounter for fracture: Secondary | ICD-10-CM | POA: Diagnosis present

## 2023-08-30 DIAGNOSIS — E873 Alkalosis: Secondary | ICD-10-CM | POA: Diagnosis present

## 2023-08-30 DIAGNOSIS — R54 Age-related physical debility: Secondary | ICD-10-CM | POA: Diagnosis present

## 2023-08-30 DIAGNOSIS — F329 Major depressive disorder, single episode, unspecified: Secondary | ICD-10-CM | POA: Diagnosis present

## 2023-08-30 DIAGNOSIS — Z66 Do not resuscitate: Secondary | ICD-10-CM | POA: Diagnosis present

## 2023-08-30 DIAGNOSIS — Z888 Allergy status to other drugs, medicaments and biological substances status: Secondary | ICD-10-CM

## 2023-08-30 DIAGNOSIS — K222 Esophageal obstruction: Secondary | ICD-10-CM | POA: Diagnosis present

## 2023-08-30 DIAGNOSIS — J441 Chronic obstructive pulmonary disease with (acute) exacerbation: Secondary | ICD-10-CM | POA: Diagnosis present

## 2023-08-30 DIAGNOSIS — I10 Essential (primary) hypertension: Secondary | ICD-10-CM | POA: Insufficient documentation

## 2023-08-30 DIAGNOSIS — Q399 Congenital malformation of esophagus, unspecified: Secondary | ICD-10-CM

## 2023-08-30 DIAGNOSIS — D509 Iron deficiency anemia, unspecified: Secondary | ICD-10-CM | POA: Diagnosis present

## 2023-08-30 DIAGNOSIS — E785 Hyperlipidemia, unspecified: Secondary | ICD-10-CM | POA: Diagnosis present

## 2023-08-30 DIAGNOSIS — F419 Anxiety disorder, unspecified: Secondary | ICD-10-CM | POA: Diagnosis present

## 2023-08-30 DIAGNOSIS — Z1152 Encounter for screening for COVID-19: Secondary | ICD-10-CM

## 2023-08-30 DIAGNOSIS — I130 Hypertensive heart and chronic kidney disease with heart failure and stage 1 through stage 4 chronic kidney disease, or unspecified chronic kidney disease: Secondary | ICD-10-CM | POA: Diagnosis present

## 2023-08-30 DIAGNOSIS — Z681 Body mass index (BMI) 19 or less, adult: Secondary | ICD-10-CM

## 2023-08-30 DIAGNOSIS — J449 Chronic obstructive pulmonary disease, unspecified: Secondary | ICD-10-CM | POA: Diagnosis present

## 2023-08-30 DIAGNOSIS — I2510 Atherosclerotic heart disease of native coronary artery without angina pectoris: Secondary | ICD-10-CM | POA: Diagnosis present

## 2023-08-30 DIAGNOSIS — F17210 Nicotine dependence, cigarettes, uncomplicated: Secondary | ICD-10-CM | POA: Diagnosis present

## 2023-08-30 HISTORY — DX: Chronic obstructive pulmonary disease, unspecified: J44.9

## 2023-08-30 LAB — MAGNESIUM: Magnesium: 1.7 mg/dL (ref 1.7–2.4)

## 2023-08-30 LAB — COMPREHENSIVE METABOLIC PANEL
ALT: 10 U/L (ref 0–44)
AST: 13 U/L — ABNORMAL LOW (ref 15–41)
Albumin: 3.1 g/dL — ABNORMAL LOW (ref 3.5–5.0)
Alkaline Phosphatase: 76 U/L (ref 38–126)
Anion gap: 11 (ref 5–15)
BUN: 11 mg/dL (ref 8–23)
CO2: 32 mmol/L (ref 22–32)
Calcium: 9 mg/dL (ref 8.9–10.3)
Chloride: 96 mmol/L — ABNORMAL LOW (ref 98–111)
Creatinine, Ser: 0.8 mg/dL (ref 0.44–1.00)
GFR, Estimated: >60 mL/min (ref >60)
Glucose, Bld: 102 mg/dL — ABNORMAL HIGH (ref 70–99)
Potassium: 3.4 mmol/L — ABNORMAL LOW (ref 3.5–5.1)
Sodium: 139 mmol/L (ref 135–145)
Total Bilirubin: 0.7 mg/dL (ref <1.2)
Total Protein: 7.5 g/dL (ref 6.5–8.1)

## 2023-08-30 LAB — CBC WITH DIFFERENTIAL/PLATELET
Abs Immature Granulocytes: 0.12 10*3/uL — ABNORMAL HIGH (ref 0.00–0.07)
Basophils Absolute: 0 10*3/uL (ref 0.0–0.1)
Basophils Relative: 0 %
Eosinophils Absolute: 0.2 10*3/uL (ref 0.0–0.5)
Eosinophils Relative: 1 %
HCT: 40.8 % (ref 36.0–46.0)
Hemoglobin: 12.4 g/dL (ref 12.0–15.0)
Immature Granulocytes: 1 %
Lymphocytes Relative: 6 %
Lymphs Abs: 0.9 10*3/uL (ref 0.7–4.0)
MCH: 28.7 pg (ref 26.0–34.0)
MCHC: 30.4 g/dL (ref 30.0–36.0)
MCV: 94.4 fL (ref 80.0–100.0)
Monocytes Absolute: 0.7 10*3/uL (ref 0.1–1.0)
Monocytes Relative: 4 %
Neutro Abs: 15.1 10*3/uL — ABNORMAL HIGH (ref 1.7–7.7)
Neutrophils Relative %: 88 %
Platelets: 337 10*3/uL (ref 150–400)
RBC: 4.32 MIL/uL (ref 3.87–5.11)
RDW: 13.2 % (ref 11.5–15.5)
WBC: 17 10*3/uL — ABNORMAL HIGH (ref 4.0–10.5)
nRBC: 0 % (ref 0.0–0.2)

## 2023-08-30 LAB — TROPONIN I (HIGH SENSITIVITY)
Troponin I (High Sensitivity): 15 ng/L (ref <18)
Troponin I (High Sensitivity): 13 ng/L (ref <18)

## 2023-08-30 LAB — PROCALCITONIN: Procalcitonin: <0.1 ng/mL

## 2023-08-30 LAB — RESP PANEL BY RT-PCR (RSV, FLU A&B, COVID)  RVPGX2
Influenza A by PCR: NEGATIVE
Influenza B by PCR: NEGATIVE
Resp Syncytial Virus by PCR: NEGATIVE
SARS Coronavirus 2 by RT PCR: NEGATIVE

## 2023-08-30 LAB — I-STAT CG4 LACTIC ACID, ED: Lactic Acid, Venous: 0.7 mmol/L (ref 0.5–1.9)

## 2023-08-30 LAB — BRAIN NATRIURETIC PEPTIDE: B Natriuretic Peptide: 429.9 pg/mL — ABNORMAL HIGH (ref 0.0–100.0)

## 2023-08-30 MED ORDER — FUROSEMIDE 10 MG/ML IJ SOLN
20.0000 mg | Freq: Every day | INTRAMUSCULAR | Status: DC
  Administered 2023-08-31: 20 mg via INTRAVENOUS
  Filled 2023-08-30: qty 2

## 2023-08-30 MED ORDER — LISINOPRIL 5 MG PO TABS
2.5000 mg | ORAL_TABLET | Freq: Every day | ORAL | Status: DC
  Administered 2023-08-31 – 2023-09-04 (×5): 2.5 mg via ORAL
  Filled 2023-08-30 (×6): qty 1

## 2023-08-30 MED ORDER — METHYLPREDNISOLONE SODIUM SUCC 125 MG IJ SOLR
60.0000 mg | Freq: Once | INTRAMUSCULAR | Status: AC
  Administered 2023-08-30: 60 mg via INTRAVENOUS
  Filled 2023-08-30: qty 2

## 2023-08-30 MED ORDER — ENOXAPARIN SODIUM 40 MG/0.4ML IJ SOSY
40.0000 mg | PREFILLED_SYRINGE | INTRAMUSCULAR | Status: DC
  Administered 2023-08-30: 40 mg via SUBCUTANEOUS
  Filled 2023-08-30: qty 0.4

## 2023-08-30 MED ORDER — FERROUS SULFATE 325 (65 FE) MG PO TABS
325.0000 mg | ORAL_TABLET | Freq: Every day | ORAL | Status: DC
  Administered 2023-08-31 – 2023-09-04 (×5): 325 mg via ORAL
  Filled 2023-08-30 (×5): qty 1

## 2023-08-30 MED ORDER — MOMETASONE FURO-FORMOTEROL FUM 200-5 MCG/ACT IN AERO
2.0000 | INHALATION_SPRAY | Freq: Two times a day (BID) | RESPIRATORY_TRACT | Status: DC
  Administered 2023-08-30 – 2023-09-04 (×9): 2 via RESPIRATORY_TRACT
  Filled 2023-08-30: qty 8.8

## 2023-08-30 MED ORDER — SODIUM CHLORIDE 0.9 % IV SOLN
1.0000 g | INTRAVENOUS | Status: DC
  Filled 2023-08-30: qty 10

## 2023-08-30 MED ORDER — ONDANSETRON HCL 4 MG/2ML IJ SOLN: 4.0000 mg | Freq: Four times a day (QID) | INTRAMUSCULAR | Status: DC | PRN

## 2023-08-30 MED ORDER — METOPROLOL SUCCINATE ER 25 MG PO TB24
12.5000 mg | ORAL_TABLET | Freq: Every day | ORAL | Status: DC
  Administered 2023-08-30 – 2023-09-04 (×6): 12.5 mg via ORAL
  Filled 2023-08-30 (×6): qty 1

## 2023-08-30 MED ORDER — FUROSEMIDE 10 MG/ML IJ SOLN
40.0000 mg | Freq: Once | INTRAMUSCULAR | Status: AC
  Administered 2023-08-30: 40 mg via INTRAVENOUS
  Filled 2023-08-30: qty 4

## 2023-08-30 MED ORDER — ENSURE ENLIVE PO LIQD
237.0000 mL | Freq: Two times a day (BID) | ORAL | Status: DC
  Administered 2023-08-30 – 2023-09-04 (×9): 237 mL via ORAL

## 2023-08-30 MED ORDER — ONDANSETRON HCL 4 MG PO TABS: 4.0000 mg | ORAL_TABLET | Freq: Four times a day (QID) | ORAL | Status: DC | PRN

## 2023-08-30 MED ORDER — ACETAMINOPHEN 325 MG PO TABS
325.0000 mg | ORAL_TABLET | Freq: Four times a day (QID) | ORAL | Status: DC | PRN
  Administered 2023-09-02: 325 mg via ORAL
  Filled 2023-08-30: qty 1

## 2023-08-30 MED ORDER — SERTRALINE HCL 100 MG PO TABS
100.0000 mg | ORAL_TABLET | Freq: Every day | ORAL | Status: DC
  Administered 2023-08-30 – 2023-09-03 (×5): 100 mg via ORAL
  Filled 2023-08-30 (×5): qty 1

## 2023-08-30 MED ORDER — SPIRONOLACTONE 12.5 MG HALF TABLET
12.5000 mg | ORAL_TABLET | Freq: Every day | ORAL | Status: DC
  Administered 2023-08-30 – 2023-09-01 (×3): 12.5 mg via ORAL
  Filled 2023-08-30 (×3): qty 1

## 2023-08-30 MED ORDER — DEXTROSE 5 % IV SOLN
500.0000 mg | INTRAVENOUS | Status: DC
  Filled 2023-08-30: qty 5

## 2023-08-30 MED ORDER — OXYCODONE HCL 5 MG PO TABS: 5.0000 mg | ORAL_TABLET | ORAL | Status: DC | PRN

## 2023-08-30 MED ORDER — MAGNESIUM SULFATE 2 GM/50ML IV SOLN
2.0000 g | Freq: Once | INTRAVENOUS | Status: AC
  Administered 2023-08-30: 2 g via INTRAVENOUS
  Filled 2023-08-30: qty 50

## 2023-08-30 MED ORDER — IPRATROPIUM-ALBUTEROL 0.5-2.5 (3) MG/3ML IN SOLN
3.0000 mL | Freq: Once | RESPIRATORY_TRACT | Status: AC
  Administered 2023-08-30: 3 mL via RESPIRATORY_TRACT
  Filled 2023-08-30: qty 3

## 2023-08-30 MED ORDER — PANTOPRAZOLE SODIUM 40 MG PO TBEC: 40.0000 mg | DELAYED_RELEASE_TABLET | Freq: Every day | ORAL | Status: DC | PRN

## 2023-08-30 MED ORDER — POTASSIUM CHLORIDE CRYS ER 20 MEQ PO TBCR
40.0000 meq | EXTENDED_RELEASE_TABLET | Freq: Once | ORAL | Status: AC
  Administered 2023-08-30: 40 meq via ORAL
  Filled 2023-08-30: qty 2

## 2023-08-30 MED ORDER — MAGIC MOUTHWASH W/LIDOCAINE: 5.0000 mL | Freq: Four times a day (QID) | ORAL | Status: DC | PRN

## 2023-08-30 MED ORDER — ACETAMINOPHEN 650 MG RE SUPP: 650.0000 mg | Freq: Three times a day (TID) | RECTAL | Status: DC | PRN

## 2023-08-30 MED ORDER — ASPIRIN 81 MG PO TBEC
81.0000 mg | DELAYED_RELEASE_TABLET | Freq: Every day | ORAL | Status: DC
  Administered 2023-08-30 – 2023-09-04 (×6): 81 mg via ORAL
  Filled 2023-08-30 (×6): qty 1

## 2023-08-30 MED ORDER — DIAZEPAM 5 MG PO TABS: 2.5000 mg | ORAL_TABLET | Freq: Every day | ORAL | Status: DC | PRN

## 2023-08-30 MED ORDER — DEXTROSE 5 % IV SOLN
500.0000 mg | Freq: Once | INTRAVENOUS | Status: AC
  Administered 2023-08-30: 500 mg via INTRAVENOUS
  Filled 2023-08-30: qty 5

## 2023-08-30 MED ORDER — SODIUM CHLORIDE 0.9 % IV SOLN
1.0000 g | Freq: Once | INTRAVENOUS | Status: AC
  Administered 2023-08-30: 1 g via INTRAVENOUS
  Filled 2023-08-30: qty 10

## 2023-08-30 NOTE — ED Triage Notes (Signed)
Pt c/o fever, cough, body aches for about a week, felt short of breath this morning and called ems, ems placed pt 2L [place] hx of COPD

## 2023-08-30 NOTE — ED Notes (Signed)
ED TO INPATIENT HANDOFF REPORT  ED Nurse Name and Phone #: Jerelyn Trimarco N  S Name/Age/Gender Julia Hernandez 87 y.o. female Room/Bed: WA18/WA18  Code Status   Code Status: Prior  Home/SNF/Other Home Patient oriented to: self, place, time, and situation Is this baseline? Yes   Triage Complete: Triage complete  Chief Complaint Acute respiratory failure with hypoxia (HCC) [J96.01]  Triage Note Pt c/o fever, cough, body aches for about a week, felt short of breath this morning and called ems, ems placed pt 2L [place] hx of COPD   Allergies Allergies  Allergen Reactions   Influenza Vac Split Quad Shortness Of Breath   Ditropan Xl [Oxybutynin] Other (See Comments)    Unknown reaction   Simvastatin Rash    Level of Care/Admitting Diagnosis ED Disposition     ED Disposition  Admit   Condition  --   Comment  Hospital Area: [facility] [[id number]]  Level of Care: Telemetry [5]  Admit to tele based on following criteria: Acute CHF  May place patient in observation at [facility] or [facility] if equivalent level of care is available:: No  Covid Evaluation: Confirmed COVID Positive  Diagnosis: Acute respiratory failure with hypoxia [facility]) [[id number]]  Admitting Physician: Bobette Mo [[id number]]  Attending Physician: Bobette Mo [[id number]]          B Medical/Surgery History Past Medical History:  Diagnosis Date   Asthma    COPD (chronic obstructive pulmonary disease) (HCC)    Hypertension    Past Surgical History:  Procedure Laterality Date   BALLOON DILATION N/A 03/24/2022   Procedure: BALLOON DILATION;  Surgeon: Kerin Salen, MD;  Location: [facility];  Service: Gastroenterology;  Laterality: N/A;   ESOPHAGOGASTRODUODENOSCOPY N/A 03/24/2022   Procedure: ESOPHAGOGASTRODUODENOSCOPY (EGD);  Surgeon: Kerin Salen, MD;  Location: [facility];  Service: Gastroenterology;  Laterality: N/A;     A IV Location/Drains/Wounds Patient  Lines/Drains/Airways Status     Active Line/Drains/Airways     Name Placement date Placement time Site Days   Peripheral IV 08/30/23 20 G Left;Posterior Forearm 08/30/23  1000  Forearm  less than 1            Intake/Output Last 24 hours No intake or output data in the 24 hours ending 08/30/23 1322  Labs/Imaging Results for orders placed or performed during the hospital encounter of 08/30/23 (from the past 48 hour(s))  CBC with Differential     Status: Abnormal   Collection Time: 08/30/23 10:00 AM  Result Value Ref Range   WBC 17.0 (H) 4.0 - 10.5 K/uL   RBC 4.32 3.87 - 5.11 MIL/uL   Hemoglobin 12.4 12.0 - 15.0 g/dL   HCT 29.5 62.1 - 30.8 %   MCV 94.4 80.0 - 100.0 fL   MCH 28.7 26.0 - 34.0 pg   MCHC 30.4 30.0 - 36.0 g/dL   RDW 65.7 84.6 - 96.2 %   Platelets 337 150 - 400 K/uL   nRBC 0.0 0.0 - 0.2 %   Neutrophils Relative % 88 %   Neutro Abs 15.1 (H) 1.7 - 7.7 K/uL   Lymphocytes Relative 6 %   Lymphs Abs 0.9 0.7 - 4.0 K/uL   Monocytes Relative 4 %   Monocytes Absolute 0.7 0.1 - 1.0 K/uL   Eosinophils Relative 1 %   Eosinophils Absolute 0.2 0.0 - 0.5 K/uL   Basophils Relative 0 %   Basophils Absolute 0.0 0.0 - 0.1 K/uL   Immature Granulocytes 1 %   Abs  Immature Granulocytes 0.12 (H) 0.00 - 0.07 K/uL    Comment: Performed at [facility], [address] [place], [place] [address]  Comprehensive metabolic panel     Status: Abnormal   Collection Time: 08/30/23 10:00 AM  Result Value Ref Range   Sodium 139 135 - 145 mmol/L   Potassium 3.4 (L) 3.5 - 5.1 mmol/L   Chloride 96 (L) 98 - 111 mmol/L   CO2 32 22 - 32 mmol/L   Glucose, Bld 102 (H) 70 - 99 mg/dL    Comment: Glucose reference range applies only to samples taken after fasting for at least 8 hours.   BUN 11 8 - 23 mg/dL   Creatinine, Ser 2.13 0.44 - 1.00 mg/dL   Calcium 9.0 8.9 - 08.6 mg/dL   Total Protein 7.5 6.5 - 8.1 g/dL   Albumin 3.1 (L) 3.5 - 5.0 g/dL   AST 13 (L) 15 - 41 U/L   ALT 10 0  - 44 U/L   Alkaline Phosphatase 76 38 - 126 U/L   Total Bilirubin 0.7 <1.2 mg/dL   GFR, Estimated >57 >84 mL/min    Comment: (NOTE) Calculated using the CKD-EPI Creatinine Equation (2021)    Anion gap 11 5 - 15    Comment: Performed at [facility], [address] [place], [place] [address]  Brain natriuretic peptide     Status: Abnormal   Collection Time: 08/30/23 10:00 AM  Result Value Ref Range   B Natriuretic Peptide 429.9 (H) 0.0 - 100.0 pg/mL    Comment: Performed at [facility], [address], [place], [place] [address]  Troponin I (High Sensitivity)     Status: None   Collection Time: 08/30/23 10:00 AM  Result Value Ref Range   Troponin I (High Sensitivity) 15 <18 ng/L    Comment: (NOTE) Elevated high sensitivity troponin I (hsTnI) values and significant  changes across serial measurements may suggest ACS but many other  chronic and acute conditions are known to elevate hsTnI results.  Refer to the "Links" section for chest pain algorithms and additional  guidance. Performed at [facility], [address], [place], [place] [address]   Magnesium     Status: None   Collection Time: 08/30/23 10:00 AM  Result Value Ref Range   Magnesium 1.7 1.7 - 2.4 mg/dL    Comment: Performed at [facility] [address], [place], [place] [address]  Resp panel by RT-PCR (RSV, Flu A&B, Covid) Anterior Nasal Swab     Status: None   Collection Time: 08/30/23 10:00 AM   Specimen: Anterior Nasal Swab  Result Value Ref Range   SARS Coronavirus 2 by RT PCR NEGATIVE NEGATIVE    Comment: (NOTE) SARS-CoV-2 target nucleic acids are NOT DETECTED.  The SARS-CoV-2 RNA is generally detectable in upper respiratory specimens during the acute phase of infection. The lowest concentration of SARS-CoV-2 viral copies this assay can detect is 138 copies/mL. A negative result does not preclude SARS-Cov-2 infection and should  not be used as the sole basis for treatment or other patient management decisions. A negative result may occur with  improper specimen collection/handling, submission of specimen other than nasopharyngeal swab, presence of viral mutation(s) within the areas targeted by this assay, and inadequate number of viral copies(<138 copies/mL). A negative result must be combined with clinical observations, patient history, and epidemiological information. The expected result is Negative.  Fact Sheet for Patients:  BloggerCourse.com  Fact  Sheet for Healthcare Providers:  SeriousBroker.it  This test is no t yet approved or cleared by the [place] FDA and  has been authorized for detection and/or diagnosis of SARS-CoV-2 by FDA under an Emergency Use Authorization (EUA). This EUA will remain  in effect (meaning this test can be used) for the duration of the COVID-19 declaration under Section 564(b)(1) of the Act, 21 U.S.C.section 360bbb-3(b)(1), unless the authorization is terminated  or revoked sooner.       Influenza A by PCR NEGATIVE NEGATIVE   Influenza B by PCR NEGATIVE NEGATIVE    Comment: (NOTE) The Xpert Xpress SARS-CoV-2/FLU/RSV plus assay is intended as an aid in the diagnosis of influenza from Nasopharyngeal swab specimens and should not be used as a sole basis for treatment. Nasal washings and aspirates are unacceptable for Xpert Xpress SARS-CoV-2/FLU/RSV testing.  Fact Sheet for Patients: BloggerCourse.com  Fact Sheet for Healthcare Providers: SeriousBroker.it  This test is not yet approved or cleared by the [place] FDA and has been authorized for detection and/or diagnosis of SARS-CoV-2 by FDA under an Emergency Use Authorization (EUA). This EUA will remain in effect (meaning this test can be used) for the duration of the COVID-19 declaration under Section 564(b)(1)  of the Act, 21 U.S.C. section 360bbb-3(b)(1), unless the authorization is terminated or revoked.     Resp Syncytial Virus by PCR NEGATIVE NEGATIVE    Comment: (NOTE) Fact Sheet for Patients: BloggerCourse.com  Fact Sheet for Healthcare Providers: SeriousBroker.it  This test is not yet approved or cleared by the [place] FDA and has been authorized for detection and/or diagnosis of SARS-CoV-2 by FDA under an Emergency Use Authorization (EUA). This EUA will remain in effect (meaning this test can be used) for the duration of the COVID-19 declaration under Section 564(b)(1) of the Act, 21 U.S.C. section 360bbb-3(b)(1), unless the authorization is terminated or revoked.  Performed at [facility], [address], [place], [place] [address]   I-Stat CG4 Lactic Acid     Status: None   Collection Time: 08/30/23 10:13 AM  Result Value Ref Range   Lactic Acid, Venous 0.7 0.5 - 1.9 mmol/L  Troponin I (High Sensitivity)     Status: None   Collection Time: 08/30/23 11:46 AM  Result Value Ref Range   Troponin I (High Sensitivity) 13 <18 ng/L    Comment: (NOTE) Elevated high sensitivity troponin I (hsTnI) values and significant  changes across serial measurements may suggest ACS but many other  chronic and acute conditions are known to elevate hsTnI results.  Refer to the "Links" section for chest pain algorithms and additional  guidance. Performed at [facility], [address], [place], [place] [address]    DG Chest Portable 1 View  Result Date: 08/30/2023 CLINICAL DATA:  Cough.  Shortness of breath. EXAM: PORTABLE CHEST 1 VIEW COMPARISON:  03/12/2023. FINDINGS: There are diffuse reticulonodular opacities throughout bilateral lungs, which is nonspecific. No acute consolidation or lung collapse. Bilateral lateral costophrenic angles are clear. Stable cardio-mediastinal silhouette. No acute  osseous abnormalities. The soft tissues are within normal limits. IMPRESSION: *Diffuse reticulonodular opacities throughout bilateral lungs, which is nonspecific. Differential diagnosis includes underlying chronic interstitial lung disease, atypical infection, pulmonary edema, etc. Correlate clinically. No other acute cardiopulmonary abnormality. Electronically Signed   By: Jules Schick M.D.   On: 08/30/2023 12:06    Pending Labs Unresulted Labs (From admission, onward)     Start     Ordered   08/31/23  0500  Procalcitonin  Tomorrow morning,   R       References:    Procalcitonin Lower Respiratory Tract Infection AND Sepsis Procalcitonin Algorithm   08/30/23 1316   08/30/23 1315  Procalcitonin  Add-on,   AD       References:    Procalcitonin Lower Respiratory Tract Infection AND Sepsis Procalcitonin Algorithm   08/30/23 1316            Vitals/Pain Today's Vitals   08/30/23 1016 08/30/23 1200 08/30/23 1230 08/30/23 1300  BP: (!) 166/80 (!) 164/72 (!) 165/73 (!) 176/103  Pulse: 82 79 78 78  Resp: (!) 22 20 (!) 30 17  Temp:      TempSrc:      SpO2: 97% 96% 96% 95%  Weight:      Height:      PainSc:        Isolation Precautions No active isolations  Medications Medications  cefTRIAXone (ROCEPHIN) 1 g in sodium chloride 0.9 % 100 mL IVPB (1 g Intravenous New Bag/Given 08/30/23 1304)  azithromycin (ZITHROMAX) 500 mg in dextrose 5 % 250 mL IVPB (500 mg Intravenous New Bag/Given 08/30/23 1311)  methylPREDNISolone sodium succinate (SOLU-MEDROL) 125 mg/2 mL injection 60 mg (60 mg Intravenous Given 08/30/23 1015)  ipratropium-albuterol (DUONEB) 0.5-2.5 (3) MG/3ML nebulizer solution 3 mL (3 mLs Nebulization Given 08/30/23 1015)  furosemide (LASIX) injection 40 mg (40 mg Intravenous Given 08/30/23 1305)    Mobility walks     Focused Assessments Pulmonary Assessment Handoff:  Lung sounds:   O2 Device: Nasal Cannula O2 Flow Rate (L/min): 2 L/min    R Recommendations: See  Admitting Provider Note  Report given to:   Additional Notes:  Aox4, 2L not baseline, 20 L FA zithromax running now, continent ambulatory to bathroom

## 2023-08-30 NOTE — ED Provider Notes (Signed)
[place] EMERGENCY DEPARTMENT AT [facility] Provider Note   CSN: [id number] Arrival date & time: 08/30/23  4259     History  Chief Complaint  Patient presents with   Shortness of Breath    Julia Hernandez is a 87 y.o. female.  HPI     87 year old female with a history of congestive heart failure, coronary disease, COPD, CKD who presents with concern for shortness of breath.   Reports for about 4 days has had increasing dyspnea. Feels like extra fluid on, thinks it  is similar to what she has experienced in the past from fluid--has lasix to take as needed but could not find it and wanted to see if it would get better. Her shortness of breath continued to worsen. Her weight today is less than yesterday. She has not had increased leg swelling. Denies fevers, chills, nausea, vomiting or chest pain.  Has had cough, feels like can't raise anything.  Dyspnea became severe overnight but feels improved.   Home Medications Prior to Admission medications   Medication Sig Start Date End Date Taking? Authorizing Provider  acetaminophen (TYLENOL) 500 MG tablet Take 500 mg by mouth 5 (five) times daily.   Yes [provider]  acyclovir ointment (ZOVIRAX) 5 % Apply 1 application topically 3 (three) times daily as needed (outbreaks).   Yes [provider]  aspirin EC 81 MG tablet Take 1 tablet (81 mg total) by mouth daily. Swallow whole. 03/31/22  Yes Rolly Salter, MD  B Complex-C (B-COMPLEX WITH VITAMIN C) tablet Take 1 tablet by mouth daily. 03/31/22  Yes Rolly Salter, MD  diazepam (VALIUM) 5 MG tablet Take 1 tablet (5 mg total) by mouth daily as needed for anxiety. Patient taking differently: Take 2.5 mg by mouth daily as needed for anxiety. 03/30/22  Yes Rolly Salter, MD  ferrous sulfate 325 (65 FE) MG tablet Take 1 tablet (325 mg total) by mouth 2 (two) times daily with a meal. Patient taking differently: Take 325 mg by mouth daily with breakfast. 03/30/22   Yes Rolly Salter, MD  furosemide (LASIX) 20 MG tablet Take 1 tablet (20 mg total) by mouth daily as needed (weight gain of >3 lbs in 1 day or >5 lbs in 1 week). 03/30/22  Yes Rolly Salter, MD  Ipratropium-Albuterol (COMBIVENT RESPIMAT) 20-100 MCG/ACT AERS respimat Inhale 1 puff into the lungs 4 (four) times daily as needed for shortness of breath.   Yes [provider]  magic mouthwash w/lidocaine SOLN Take 5 mLs by mouth 4 (four) times daily as needed for mouth pain.   Yes [provider]  metoprolol succinate (TOPROL-XL) 25 MG 24 hr tablet Take 12.5 mg by mouth at bedtime. 03/02/22  Yes [provider]  oxyCODONE (OXY IR/ROXICODONE) 5 MG immediate release tablet Take 0.5 tablets (2.5 mg total) by mouth every 4 (four) hours as needed for moderate pain. Patient taking differently: Take 5 mg by mouth every 4 (four) hours as needed for moderate pain (pain score 4-6). 03/30/22  Yes Rolly Salter, MD  pantoprazole (PROTONIX) 40 MG tablet Take 1 tablet (40 mg total) by mouth daily. Patient taking differently: Take 40 mg by mouth daily as needed (for acid reflux). 03/31/22  Yes Rolly Salter, MD  sertraline (ZOLOFT) 100 MG tablet Take 100 mg by mouth at bedtime.   Yes [provider]  spironolactone (ALDACTONE) 25 MG tablet Take 0.5 tablets (12.5 mg total) by mouth daily. 07/07/22  Yes Clegg, Amy D, NP  SYMBICORT 160-4.5 MCG/ACT inhaler Inhale 2 puffs into the lungs 2 (two) times daily. 03/02/22  Yes [provider]      Allergies    Influenza vac split quad, Ditropan xl [oxybutynin], and Simvastatin    Review of Systems   Review of Systems  Physical Exam Updated Vital Signs BP (!) 159/72 (BP Location: Right Arm)   Pulse 68   Temp 97.6 F (36.4 C) (Oral)   Resp 16   Ht 4\' 8"  (1.422 m)   Wt 39 kg   SpO2 93%   BMI 19.28 kg/m  Physical Exam Vitals and nursing note reviewed.  Constitutional:      General: She is not in acute distress.     Appearance: She is well-developed. She is not diaphoretic.  HENT:     Head: Normocephalic and atraumatic.  Eyes:     Conjunctiva/sclera: Conjunctivae normal.  Neck:     Vascular: JVD present.  Cardiovascular:     Rate and Rhythm: Normal rate and regular rhythm.     Heart sounds: Normal heart sounds. No murmur heard.    No friction rub. No gallop.  Pulmonary:     Effort: Pulmonary effort is normal. Bradypnea present. No respiratory distress.     Breath sounds: Normal breath sounds. No rales.  Abdominal:     General: There is no distension.     Palpations: Abdomen is soft.     Tenderness: There is no abdominal tenderness. There is no guarding.  Musculoskeletal:        General: No tenderness.     Cervical back: Normal range of motion.  Skin:    General: Skin is warm and dry.     Findings: No erythema or rash.  Neurological:     Mental Status: She is alert and oriented to person, place, and time.     ED Results / Procedures / Treatments   Labs (all labs ordered are listed, but only abnormal results are displayed) Labs Reviewed  CBC WITH DIFFERENTIAL/PLATELET - Abnormal; Notable for the following components:      Result Value   WBC 17.0 (*)    Neutro Abs 15.1 (*)    Abs Immature Granulocytes 0.12 (*)    All other components within normal limits  COMPREHENSIVE METABOLIC PANEL - Abnormal; Notable for the following components:   Potassium 3.4 (*)    Chloride 96 (*)    Glucose, Bld 102 (*)    Albumin 3.1 (*)    AST 13 (*)    All other components within normal limits  BRAIN NATRIURETIC PEPTIDE - Abnormal; Notable for the following components:   B Natriuretic Peptide 429.9 (*)    All other components within normal limits  RESP PANEL BY RT-PCR (RSV, FLU A&B, COVID)  RVPGX2  MAGNESIUM  PROCALCITONIN  PROCALCITONIN  I-STAT CG4 LACTIC ACID, ED  TROPONIN I (HIGH SENSITIVITY)  TROPONIN I (HIGH SENSITIVITY)    EKG EKG Interpretation Date/Time:  Monday August 30 2023  09:02:44 EST Ventricular Rate:  83 PR Interval:  128 QRS Duration:  117 QT Interval:  404 QTC Calculation: 475 R Axis:   198  Text Interpretation: Sinus rhythm Probable left atrial enlargement IRBBB and LPFB Lateral infarct, old Minimal ST elevation, inferior leads No significant change since last tracing Confirmed by Alvira Monday ([id number]) on 08/30/2023 9:04:43 AM  Radiology DG Chest Portable 1 View  Result Date: 08/30/2023 CLINICAL DATA:  Cough.  Shortness of breath. EXAM: PORTABLE CHEST  1 VIEW COMPARISON:  03/12/2023. FINDINGS: There are diffuse reticulonodular opacities throughout bilateral lungs, which is nonspecific. No acute consolidation or lung collapse. Bilateral lateral costophrenic angles are clear. Stable cardio-mediastinal silhouette. No acute osseous abnormalities. The soft tissues are within normal limits. IMPRESSION: *Diffuse reticulonodular opacities throughout bilateral lungs, which is nonspecific. Differential diagnosis includes underlying chronic interstitial lung disease, atypical infection, pulmonary edema, etc. Correlate clinically. No other acute cardiopulmonary abnormality. Electronically Signed   By: Jules Schick M.D.   On: 08/30/2023 12:06    Procedures Procedures    Medications Ordered in ED Medications  enoxaparin (LOVENOX) injection 40 mg (40 mg Subcutaneous Given 08/30/23 2057)  cefTRIAXone (ROCEPHIN) 1 g in sodium chloride 0.9 % 100 mL IVPB (has no administration in time range)  azithromycin (ZITHROMAX) 500 mg in dextrose 5 % 250 mL IVPB (has no administration in time range)  acetaminophen (TYLENOL) tablet 325 mg (has no administration in time range)    Or  acetaminophen (TYLENOL) suppository 650 mg (has no administration in time range)  ondansetron (ZOFRAN) tablet 4 mg (has no administration in time range)    Or  ondansetron (ZOFRAN) injection 4 mg (has no administration in time range)  furosemide (LASIX) injection 20 mg (has no administration in  time range)  lisinopril (ZESTRIL) tablet 2.5 mg (has no administration in time range)  feeding supplement (ENSURE ENLIVE / ENSURE PLUS) liquid 237 mL (237 mLs Oral Given 08/30/23 1508)  aspirin EC tablet 81 mg (81 mg Oral Given 08/30/23 1745)  diazepam (VALIUM) tablet 2.5 mg (has no administration in time range)  ferrous sulfate tablet 325 mg (has no administration in time range)  magic mouthwash w/lidocaine (has no administration in time range)  metoprolol succinate (TOPROL-XL) 24 hr tablet 12.5 mg (12.5 mg Oral Given 08/30/23 1745)  oxyCODONE (Oxy IR/ROXICODONE) immediate release tablet 5 mg (has no administration in time range)  pantoprazole (PROTONIX) EC tablet 40 mg (has no administration in time range)  sertraline (ZOLOFT) tablet 100 mg (100 mg Oral Given 08/30/23 2057)  spironolactone (ALDACTONE) tablet 12.5 mg (12.5 mg Oral Given 08/30/23 1745)  mometasone-formoterol (DULERA) 200-5 MCG/ACT inhaler 2 puff (2 puffs Inhalation Given 08/30/23 2004)  methylPREDNISolone sodium succinate (SOLU-MEDROL) 125 mg/2 mL injection 60 mg (60 mg Intravenous Given 08/30/23 1015)  ipratropium-albuterol (DUONEB) 0.5-2.5 (3) MG/3ML nebulizer solution 3 mL (3 mLs Nebulization Given 08/30/23 1015)  furosemide (LASIX) injection 40 mg (40 mg Intravenous Given 08/30/23 1305)  cefTRIAXone (ROCEPHIN) 1 g in sodium chloride 0.9 % 100 mL IVPB (1 g Intravenous New Bag/Given 08/30/23 1304)  azithromycin (ZITHROMAX) 500 mg in dextrose 5 % 250 mL IVPB (500 mg Intravenous New Bag/Given 08/30/23 1311)  potassium chloride SA (KLOR-CON M) CR tablet 40 mEq (40 mEq Oral Given 08/30/23 1507)  magnesium sulfate IVPB 2 g 50 mL (2 g Intravenous New Bag/Given 08/30/23 1513)    ED Course/ Medical Decision Making/ A&P                                  87 year old female with a history of congestive heart failure, coronary disease, COPD, CKD who presents with concern for shortness of breath and found to have O2 saturations in the  80s.  Differential diagnosis for dyspnea includes ACS, PE, COPD exacerbation, CHF exacerbation, anemia, pneumonia, viral etiology such as COVID 19 infection, metabolic abnormality.     Labs completed and personally about interpreted by me show stable normal  troponins of 15 and 13, low suspicion for ACS.  Lactic acid is within normal limits.  For CBC shows no signs of anemia, does show leukocytosis of 17,000.  COVID/flu/RSV testing negative. CMP with normal renal function, mild hypokalemia.  BNP is 429, similar to value in September 2023 when she was admitted and diuresed.    Chest x-ray was completed and evaluated by me and radiology and shows diffuse opacities, per radiology consider interstitial lung disease, atypical infection, pulmonary edema.  Clinically, in setting of leukocytosis, will cover for possible infectious etiology, however suspect likely pulmonary edema and underlying CHF.  Given Rocephin, azithromycin, Lasix.  Initially, she is also given Solu-Medrol and DuoNebs due to concern for COPD as well.    Will admit to the hospitalist service for continued treatment of acute hypoxic respiratory failure and CHF.         Final Clinical Impression(s) / ED Diagnoses Final diagnoses:  Acute hypoxic respiratory failure (HCC)  Pneumonia due to infectious organism, unspecified laterality, unspecified part of lung  Acute on chronic diastolic congestive heart failure [facility])    Rx / DC Orders ED Discharge Orders     None         Alvira Monday, MD 08/30/23 2341

## 2023-08-30 NOTE — H&P (Signed)
History and Physical    Patient: Julia Hernandez ZOX:[id number] DOB: 1936/05/25 DOA: 08/30/2023 DOS: the patient was seen and examined on 08/30/2023 PCP: Sigmund Hazel, MD  Patient coming from: Home  Chief Complaint:  Chief Complaint  Patient presents with   Shortness of Breath   HPI: Julia Hernandez is a 87 y.o. female with medical history significant of asthma, COPD, lumbar wedge compression fracture, hypertension, tobacco abuse, elevated troponin, CAD, chronic diastolic CHF who is coming to the emergency department due to progressively worse dyspnea for the last few days after she seems to have contracted a viral illness from a friend's grandkids.  She first had symptoms about a week ago.  She has had fever, cough, sore throat, rhinorrhea, sinus congestion, wheezing, some orthopnea, night sweats and fatigue.  However, over the last few days she feels like she is getting CHF symptoms.  She has had chest pressure and significant worsening of dyspnea over the past 2 days.  She believes she may have have more salt than usual over the past weekend.  No hemoptysis.  No palpitations, PND or pitting edema of the lower extremities.  No abdominal pain, nausea, emesis, diarrhea, constipation, melena or hematochezia.  No flank pain, dysuria, frequency or hematuria.  No polyuria, polydipsia, polyphagia or blurred vision.   Lab work: Coronavirus, influenza and RSV PCR were negative.  CBC showed a white count 17.0 with 88% neutrophils, hemoglobin 12.4 g/dL platelets 811.  Lactic acid was normal.  Troponin x 2 negative.  BNP 430 pg/mL.  CMP showed a potassium of 3.4 and chloride 96 mmol/L, the rest of the electrolytes and renal function were normal.  Glucose 102 mg/dL, albumin 3.1 g/dL AST 13 units/L, the rest of the LFTs were normal.  Imaging: Portable 1 view chest radiograph showing diffuse reticulonodular opacities throughout bilateral lungs, which is nonspecific.  This could be due to underlying chronic the  residual lung disease, atypical infection, pulmonary edema.  Correlate clinically.   ED course: Initial vital signs were temperature 98.5 F, pulse 84, respiration 18, BP 189/92 mmHg O2 sat 89% on room air.  Patient was placed on nasal cannula oxygen at 2 LPM, given furosemide 40 mg IVP, methylprednisolone 60 mg IVP, ceftriaxone 1 g and azithromycin 500 mg IVPB.  Review of Systems: As mentioned in the history of present illness. All other systems reviewed and are negative. Past Medical History:  Diagnosis Date   Asthma    COPD (chronic obstructive pulmonary disease) (HCC)    Hypertension    Past Surgical History:  Procedure Laterality Date   BALLOON DILATION N/A 03/24/2022   Procedure: BALLOON DILATION;  Surgeon: Kerin Salen, MD;  Location: [facility] ENDOSCOPY;  Service: Gastroenterology;  Laterality: N/A;   ESOPHAGOGASTRODUODENOSCOPY N/A 03/24/2022   Procedure: ESOPHAGOGASTRODUODENOSCOPY (EGD);  Surgeon: Kerin Salen, MD;  Location: [facility];  Service: Gastroenterology;  Laterality: N/A;   Social History:  reports that she has been smoking cigarettes. She has a 30 pack-year smoking history. She has never used smokeless tobacco. She reports that she does not drink alcohol and does not use drugs.  Allergies  Allergen Reactions   Influenza Vac Split Quad Shortness Of Breath   Ditropan Xl [Oxybutynin] Other (See Comments)    Unknown reaction   Simvastatin Rash    No family history on file.  Prior to Admission medications   Medication Sig Start Date End Date Taking? Authorizing Provider  acetaminophen (TYLENOL) 500 MG tablet Take 500 mg by mouth 5 (five) times daily.  [provider]  acyclovir ointment (ZOVIRAX) 5 % Apply 1 application topically 3 (three) times daily as needed (outbreaks).    [provider]  aspirin EC 81 MG tablet Take 1 tablet (81 mg total) by mouth daily. Swallow whole. 03/31/22   Rolly Salter, MD  B Complex-C (B-COMPLEX WITH VITAMIN C) tablet Take  1 tablet by mouth daily. Patient not taking: Reported on 07/07/2022 03/31/22   Rolly Salter, MD  diazepam (VALIUM) 5 MG tablet Take 1 tablet (5 mg total) by mouth daily as needed for anxiety. 03/30/22   Rolly Salter, MD  ferrous sulfate 325 (65 FE) MG tablet Take 1 tablet (325 mg total) by mouth 2 (two) times daily with a meal. Patient taking differently: Take 325 mg by mouth daily with breakfast. 03/30/22   Rolly Salter, MD  furosemide (LASIX) 20 MG tablet Take 1 tablet (20 mg total) by mouth daily as needed (weight gain of >3 lbs in 1 day or >5 lbs in 1 week). 03/30/22   Rolly Salter, MD  Ipratropium-Albuterol (COMBIVENT RESPIMAT) 20-100 MCG/ACT AERS respimat Inhale 1 puff into the lungs 4 (four) times daily as needed for shortness of breath.    [provider]  magic mouthwash w/lidocaine SOLN Take 5 mLs by mouth 4 (four) times daily as needed for mouth pain.    [provider]  metoprolol succinate (TOPROL-XL) 25 MG 24 hr tablet Take 12.5 mg by mouth at bedtime. 03/02/22   [provider]  oxyCODONE (OXY IR/ROXICODONE) 5 MG immediate release tablet Take 0.5 tablets (2.5 mg total) by mouth every 4 (four) hours as needed for moderate pain. Patient taking differently: Take 5 mg by mouth every 4 (four) hours as needed for moderate pain. 03/30/22   Rolly Salter, MD  pantoprazole (PROTONIX) 40 MG tablet Take 1 tablet (40 mg total) by mouth daily. Patient taking differently: Take 40 mg by mouth daily as needed (for acid reflux). 03/31/22   Rolly Salter, MD  potassium chloride SA (KLOR-CON M) 20 MEQ tablet Take 1 tablet (20 mEq total) by mouth daily for 14 days. 06/26/22 07/10/22  Darlin Drop, DO  sertraline (ZOLOFT) 100 MG tablet Take 100 mg by mouth at bedtime.    [provider]  spironolactone (ALDACTONE) 25 MG tablet Take 0.5 tablets (12.5 mg total) by mouth daily. 07/07/22   Clegg, Amy D, NP  SYMBICORT 160-4.5 MCG/ACT inhaler Inhale 2 puffs into the  lungs 2 (two) times daily. 03/02/22   [provider]    Physical Exam: Vitals:   08/30/23 0901 08/30/23 0904 08/30/23 0941 08/30/23 1016  BP: (!) 189/92   (!) 166/80  Pulse: 84   82  Resp: 18   (!) 22  Temp: 98.5 F (36.9 C)     TempSrc: Oral     SpO2: (!) 89%   97%  Weight:  44.6 kg 44.6 kg   Height:   4\' 9"  (1.448 m)    Physical Exam Vitals and nursing note reviewed.  Constitutional:      General: She is awake. She is not in acute distress.    Appearance: She is well-developed. She is ill-appearing.     Interventions: Nasal cannula in place.  HENT:     Head: Normocephalic.     Nose: No rhinorrhea.     Mouth/Throat:     Mouth: Mucous membranes are moist.  Eyes:     General: No scleral icterus.  Pupils: Pupils are equal, round, and reactive to light.  Neck:     Vascular: No JVD.     Comments: Had mild JVD earlier in the ED. Cardiovascular:     Rate and Rhythm: Normal rate and regular rhythm.     Heart sounds: S1 normal and S2 normal.  Pulmonary:     Effort: Pulmonary effort is normal. No accessory muscle usage or respiratory distress.     Breath sounds: Examination of the right-lower field reveals rales. Examination of the left-lower field reveals rales. Wheezing, rhonchi and rales present.  Abdominal:     General: Bowel sounds are normal. There is no distension.     Palpations: Abdomen is soft.     Tenderness: There is no abdominal tenderness. There is no guarding.  Musculoskeletal:     Cervical back: Neck supple.     Right lower leg: No edema.     Left lower leg: No edema.  Skin:    General: Skin is warm and dry.  Neurological:     General: No focal deficit present.     Mental Status: She is alert and oriented to person, place, and time.  Psychiatric:        Mood and Affect: Mood normal.        Behavior: Behavior normal. Behavior is cooperative.   Data Reviewed:  Results are pending, will review when available. 03/20/2022 transthoracic  echocardiogram IMPRESSIONS:   1. Left ventricular ejection fraction, by estimation, is 55%. The left  ventricle has normal function. The left ventricle has no regional wall  motion abnormalities. Left ventricular diastolic parameters are consistent  with Grade I diastolic dysfunction  (impaired relaxation).   2. Right ventricular systolic function is normal. The right ventricular  size is normal. There is normal pulmonary artery systolic pressure. The  estimated right ventricular systolic pressure is 21.0 mmHg.   3. The mitral valve is normal in structure. Trivial mitral valve  regurgitation. No evidence of mitral stenosis.   4. The aortic valve is tricuspid. There is mild calcification of the  aortic valve. Aortic valve regurgitation is not visualized. No aortic  stenosis is present.   5. The inferior vena cava is normal in size with greater than 50%  respiratory variability, suggesting right atrial pressure of 3 mmHg.   EKG: Vent. rate 83 BPM PR interval 128 ms QRS duration 117 ms QT/QTcB 404/475 ms P-R-T axes 65 198 52 Sinus rhythm Probable left atrial enlargement IRBBB and LPFB Lateral infarct, old Minimal ST elevation, inferior leads  Assessment and Plan: Principal Problem:   Acute on chronic respiratory failure with hypoxia (HCC) In the setting of:   Viral illness  Resulting in:   COPD with acute exacerbation (HCC) Also now has symptoms of:   Acute on chronic diastolic CHF (congestive heart failure) (HCC) Observation/telemetry. Supplemental oxygen as needed. Sodium and fluid restriction. Continue furosemide 20 mg IVP daily. Monitor daily weights, intake and output. Monitor renal function electrolytes. Check echocardiogram. Received methylprednisolone 60 mg IVP x 1. Bronchodilators as needed. Continue Symbicort formulary equivalent.  Active Problems:   Atherosclerotic heart disease of native  coronary arteries without angina pectoris Continue aspirin 81 mg  p.o. daily. Continue metoprolol succinate 12.5 mg p.o. bedtime.    Stage 3a chronic kidney disease (CKD) (HCC) Monitor renal function electrolytes.    Hypokalemia Replacing. Magnesium was supplemented. Follow-up potassium level in AM.    Tobacco abuse Not smoking daily. Smoking cessation advised.    Hyperlipidemia Currently not on  medical therapy. Follow-up with PCP as an outpatient.    Anxiety   Major depression Continue sertraline 100 mg p.o. daily. Continue diazepam 2.5 mg p.o. daily as needed.    Mild protein malnutrition (HCC) May benefit from protein supplementation. Consider nutritional services evaluation. Follow-up albumin level.    Compression fracture of spine (HCC). Continue oxycodone 2.5 mg p.o. as needed.    Advance Care Planning:   Code Status: Limited: Do not attempt resuscitation (DNR) -DNR-LIMITED -Do Not Intubate/DNI    Consults:   Family Communication:   Severity of Illness: The appropriate patient status for this patient is OBSERVATION. Observation status is judged to be reasonable and necessary in order to provide the required intensity of service to ensure the patient's safety. The patient's presenting symptoms, physical exam findings, and initial radiographic and laboratory data in the context of their medical condition is felt to place them at decreased risk for further clinical deterioration. Furthermore, it is anticipated that the patient will be medically stable for discharge from the hospital within 2 midnights of admission.   Author: Bobette Mo, MD 08/30/2023 1:08 PM  For on call review [email or web address].   This document was prepared using Dragon voice recognition software and may contain some unintended transcription errors.

## 2023-08-31 ENCOUNTER — Observation Stay (HOSPITAL_COMMUNITY): Payer: Medicare HMO

## 2023-08-31 DIAGNOSIS — E785 Hyperlipidemia, unspecified: Secondary | ICD-10-CM | POA: Diagnosis present

## 2023-08-31 DIAGNOSIS — E876 Hypokalemia: Secondary | ICD-10-CM | POA: Diagnosis present

## 2023-08-31 DIAGNOSIS — F329 Major depressive disorder, single episode, unspecified: Secondary | ICD-10-CM | POA: Diagnosis present

## 2023-08-31 DIAGNOSIS — K219 Gastro-esophageal reflux disease without esophagitis: Secondary | ICD-10-CM | POA: Diagnosis present

## 2023-08-31 DIAGNOSIS — I2510 Atherosclerotic heart disease of native coronary artery without angina pectoris: Secondary | ICD-10-CM | POA: Diagnosis present

## 2023-08-31 DIAGNOSIS — Z79899 Other long term (current) drug therapy: Secondary | ICD-10-CM | POA: Diagnosis not present

## 2023-08-31 DIAGNOSIS — I5031 Acute diastolic (congestive) heart failure: Secondary | ICD-10-CM | POA: Diagnosis not present

## 2023-08-31 DIAGNOSIS — F17210 Nicotine dependence, cigarettes, uncomplicated: Secondary | ICD-10-CM | POA: Diagnosis present

## 2023-08-31 DIAGNOSIS — N1831 Chronic kidney disease, stage 3a: Secondary | ICD-10-CM | POA: Diagnosis present

## 2023-08-31 DIAGNOSIS — J690 Pneumonitis due to inhalation of food and vomit: Secondary | ICD-10-CM | POA: Diagnosis present

## 2023-08-31 DIAGNOSIS — Z1152 Encounter for screening for COVID-19: Secondary | ICD-10-CM | POA: Diagnosis not present

## 2023-08-31 DIAGNOSIS — J9621 Acute and chronic respiratory failure with hypoxia: Secondary | ICD-10-CM | POA: Diagnosis present

## 2023-08-31 DIAGNOSIS — E440 Moderate protein-calorie malnutrition: Secondary | ICD-10-CM | POA: Diagnosis present

## 2023-08-31 DIAGNOSIS — E873 Alkalosis: Secondary | ICD-10-CM | POA: Diagnosis present

## 2023-08-31 DIAGNOSIS — Z888 Allergy status to other drugs, medicaments and biological substances status: Secondary | ICD-10-CM | POA: Diagnosis not present

## 2023-08-31 DIAGNOSIS — J9601 Acute respiratory failure with hypoxia: Secondary | ICD-10-CM | POA: Diagnosis present

## 2023-08-31 DIAGNOSIS — Z66 Do not resuscitate: Secondary | ICD-10-CM | POA: Diagnosis present

## 2023-08-31 DIAGNOSIS — I5032 Chronic diastolic (congestive) heart failure: Secondary | ICD-10-CM | POA: Diagnosis present

## 2023-08-31 DIAGNOSIS — F419 Anxiety disorder, unspecified: Secondary | ICD-10-CM | POA: Diagnosis present

## 2023-08-31 DIAGNOSIS — Z681 Body mass index (BMI) 19 or less, adult: Secondary | ICD-10-CM | POA: Diagnosis not present

## 2023-08-31 DIAGNOSIS — D509 Iron deficiency anemia, unspecified: Secondary | ICD-10-CM | POA: Diagnosis present

## 2023-08-31 DIAGNOSIS — J441 Chronic obstructive pulmonary disease with (acute) exacerbation: Secondary | ICD-10-CM | POA: Diagnosis present

## 2023-08-31 DIAGNOSIS — K222 Esophageal obstruction: Secondary | ICD-10-CM | POA: Diagnosis present

## 2023-08-31 DIAGNOSIS — I10 Essential (primary) hypertension: Secondary | ICD-10-CM | POA: Insufficient documentation

## 2023-08-31 DIAGNOSIS — Q399 Congenital malformation of esophagus, unspecified: Secondary | ICD-10-CM | POA: Diagnosis not present

## 2023-08-31 DIAGNOSIS — I130 Hypertensive heart and chronic kidney disease with heart failure and stage 1 through stage 4 chronic kidney disease, or unspecified chronic kidney disease: Secondary | ICD-10-CM | POA: Diagnosis present

## 2023-08-31 LAB — CBC
HCT: 43.7 % (ref 36.0–46.0)
Hemoglobin: 13.6 g/dL (ref 12.0–15.0)
MCH: 29.5 pg (ref 26.0–34.0)
MCHC: 31.1 g/dL (ref 30.0–36.0)
MCV: 94.8 fL (ref 80.0–100.0)
Platelets: 366 10*3/uL (ref 150–400)
RBC: 4.61 MIL/uL (ref 3.87–5.11)
RDW: 13.2 % (ref 11.5–15.5)
WBC: 18.8 10*3/uL — ABNORMAL HIGH (ref 4.0–10.5)
nRBC: 0 % (ref 0.0–0.2)

## 2023-08-31 LAB — RESPIRATORY PANEL BY PCR

## 2023-08-31 LAB — ECHOCARDIOGRAM COMPLETE
AR max vel: 1.91 cm2
AV Area VTI: 2.21 cm2
AV Area mean vel: 1.92 cm2
AV Mean grad: 5 mm[Hg]
AV Peak grad: 8.4 mm[Hg]
Ao pk vel: 1.45 m/s
Area-P 1/2: 3.83 cm2
Height: 56 in
S' Lateral: 2.8 cm
Weight: 1358.03 [oz_av]

## 2023-08-31 LAB — BASIC METABOLIC PANEL
Anion gap: 11 (ref 5–15)
BUN: 18 mg/dL (ref 8–23)
CO2: 35 mmol/L — ABNORMAL HIGH (ref 22–32)
Calcium: 9 mg/dL (ref 8.9–10.3)
Chloride: 95 mmol/L — ABNORMAL LOW (ref 98–111)
Creatinine, Ser: 0.92 mg/dL (ref 0.44–1.00)
GFR, Estimated: >60 mL/min (ref >60)
Glucose, Bld: 104 mg/dL — ABNORMAL HIGH (ref 70–99)
Potassium: 3.8 mmol/L (ref 3.5–5.1)
Sodium: 141 mmol/L (ref 135–145)

## 2023-08-31 LAB — PROCALCITONIN: Procalcitonin: <0.1 ng/mL

## 2023-08-31 MED ORDER — GUAIFENESIN ER 600 MG PO TB12
600.0000 mg | ORAL_TABLET | Freq: Two times a day (BID) | ORAL | Status: DC
  Administered 2023-08-31 – 2023-09-04 (×8): 600 mg via ORAL
  Filled 2023-08-31 (×8): qty 1

## 2023-08-31 MED ORDER — ENOXAPARIN SODIUM 30 MG/0.3ML IJ SOSY
30.0000 mg | PREFILLED_SYRINGE | INTRAMUSCULAR | Status: DC
  Administered 2023-08-31 – 2023-09-03 (×4): 30 mg via SUBCUTANEOUS
  Filled 2023-08-31 (×4): qty 0.3

## 2023-08-31 NOTE — Assessment & Plan Note (Signed)
Ensure

## 2023-08-31 NOTE — Progress Notes (Signed)
Flatter valve given to this patient, education provided

## 2023-08-31 NOTE — Plan of Care (Signed)
  Problem: Education: Goal: Knowledge of General Education information will improve Description: Including pain rating scale, medication(s)/side effects and non-pharmacologic comfort measures Outcome: Progressing   Problem: Clinical Measurements: Goal: Ability to maintain clinical measurements within normal limits will improve Outcome: Progressing   Problem: Activity: Goal: Risk for activity intolerance will decrease Outcome: Progressing   Problem: Nutrition: Goal: Adequate nutrition will be maintained Outcome: Progressing   Problem: Safety: Goal: Ability to remain free from injury will improve Outcome: Progressing   

## 2023-08-31 NOTE — Assessment & Plan Note (Signed)
Hgb stable relative to baseline 

## 2023-08-31 NOTE — Assessment & Plan Note (Signed)
-   Continue aspirin, lisinopril, metoprolol

## 2023-08-31 NOTE — Assessment & Plan Note (Signed)
Cr stable relative to baseline 

## 2023-08-31 NOTE — Progress Notes (Signed)
Md requested to wean this patient to room air, Patient Sat on Room air was 78%

## 2023-08-31 NOTE — Care Management Obs Status (Signed)
MEDICARE OBSERVATION STATUS NOTIFICATION   Patient Details  Name: Julia Hernandez MRN: [id number] Date of Birth: [birth year]   Medicare Observation Status Notification Given:  Yes    MahabirOlegario Messier, RN 08/31/2023, 2:33 PM

## 2023-08-31 NOTE — Assessment & Plan Note (Signed)
Asymptomatic. 

## 2023-08-31 NOTE — Assessment & Plan Note (Signed)
-   Continue metoprolol, lisinopril, spironolactone

## 2023-08-31 NOTE — Assessment & Plan Note (Addendum)
No orthopnea, leg swelling.  BNP at baseline.  No improvement with Lasix.  Doubt CHF.   On PRN Lasix at home. - Hold Lasix - Continue spironolactone, metoprolol - Monitor clinically

## 2023-08-31 NOTE — Assessment & Plan Note (Signed)
Patient presented with dyspnea, tachypnea and hypoxia to the 70s.  Weaned now to 2L.    COVID, flu, RSV, and extended RV panel negative.  Procalcitonin negative.  BNP slightly up but no orthopnea or leg sewlling, doubt CHF.  URI symptoms, Xray pattern and recent exposure to sick contact suggests viral pneumonia. - Stop antibiotics - Mucinex - Flutter and IS - Wean O2 as able

## 2023-08-31 NOTE — TOC Initial Note (Signed)
Transition of Care [facility]) - Initial/Assessment Note    Patient Details  Name: Julia Hernandez MRN: [id number] Date of Birth: 03/26/1936  [facility]) CM/SW Contact:    Lanier Clam, RN Phone Number: 08/31/2023, 2:35 PM  Clinical Narrative:  Per Dawn(dtr) has friends in apartment complex, Dawn(dtr) lives away. Has own transport home.                 Expected Discharge Plan: Home/Self Care Barriers to Discharge: Continued Medical Work up   Patient Goals and CMS Choice Patient states their goals for this hospitalization and ongoing recovery are:: Home CMS Medicare.gov Compare Post Acute Care list provided to:: Patient Represenative (must comment) (Dawn(dtr)) Choice offered to / list presented to : Adult Children [place] ownership interest in [facility]provided to:: Adult Children    Expected Discharge Plan and Services   Discharge Planning Services: CM Consult Post Acute Care Choice: Durable Medical Equipment, Resumption of Svcs/PTA Provider (cane,rw(doesn't use)) Living arrangements for the past 2 months: Single Family Home                                      Prior Living Arrangements/Services Living arrangements for the past 2 months: Single Family Home Lives with:: Self Patient language and need for interpreter reviewed:: Yes Do you feel safe going back to the place where you live?: Yes      Need for Family Participation in Patient Care: Yes (Comment) Care giver support system in place?: Yes (comment)   Criminal Activity/Legal Involvement Pertinent to Current Situation/Hospitalization: No - Comment as needed  Activities of Daily Living   ADL Screening (condition at time of admission) Independently performs ADLs?: Yes (appropriate for developmental age) Is the patient deaf or have difficulty hearing?: No Does the patient have difficulty seeing, even when wearing glasses/contacts?: No Does the patient have difficulty concentrating,  remembering, or making decisions?: No  Permission Sought/Granted Permission sought to share information with : Case Manager Permission granted to share information with : Yes, Verbal Permission Granted  Share Information with NAME: Case manager           Emotional Assessment Appearance:: Appears stated age   Affect (typically observed): Accepting Orientation: : Oriented to Self, Oriented to Place, Oriented to  Time, Oriented to Situation Alcohol / Substance Use: Not Applicable Psych Involvement: No (comment)  Admission diagnosis:  Acute respiratory failure with hypoxia (HCC) [J96.01] Patient Active Problem List   Diagnosis Date Noted   Mild protein malnutrition (HCC) 08/30/2023   Acute on chronic respiratory failure with hypoxia (HCC) 06/25/2022   Acute on chronic diastolic CHF (congestive heart failure) (HCC) 06/25/2022   Elevated troponin 06/25/2022   Hypokalemia 06/25/2022   Chronic pain 06/25/2022   Compression fracture of spine (HCC) 06/25/2022   Anxiety 06/25/2022   Malnutrition of moderate degree 03/24/2022   DNR (do not resuscitate)/DNI(Do Not Intubate) 03/20/2022   AKI (acute kidney injury) (HCC) 03/20/2022   Normocytic anemia 03/19/2022   Tobacco abuse 03/19/2022   Stage 3a chronic kidney disease (CKD) (HCC) 08/20/2021   Presence of coronary angioplasty implant and graft 08/20/2021   Wedge compression fracture of unspecified lumbar vertebra, initial encounter for closed fracture (HCC) 08/20/2021   Osteoporosis 08/20/2021   Hyperlipidemia 08/20/2021   Major depression 08/20/2021   Vitamin B12 deficiency (non anemic) 08/20/2021   Primary insomnia 08/20/2021   Cardiomyopathy (HCC) 12/14/2018   Atherosclerotic  heart disease of 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 [place] [address] Phone: [phone] Fax: [phone]  [id number] - [place], [place] - [address] [place] [address] Phone: [phone] Fax: [phone]     Social Determinants of Health (SDOH) Social History: SDOH Screenings   Food Insecurity: No Food Insecurity (08/30/2023)  Housing: Patient Declined (08/30/2023)  Transportation Needs: No Transportation Needs (08/30/2023)  Utilities: Not At Risk (08/30/2023)  Alcohol Screen: Low Risk  (06/26/2022)  Financial Resource Strain: Low Risk  (06/26/2022)  Social Connections: Unknown (02/12/2022)   Received from [facility]  Tobacco Use: High Risk (08/30/2023)   SDOH Interventions:     Readmission Risk Interventions    06/26/2022    8:54 AM  Readmission Risk Prevention Plan  Transportation Screening Complete  PCP or Specialist Appt within 3-5 Days Complete  HRI or Home Care Consult Complete  Social Work Consult for Recovery Care Planning/Counseling Complete  Palliative Care Screening Not Applicable  Medication Review Oceanographer) Complete

## 2023-08-31 NOTE — Hospital Course (Addendum)
87 y.o. F with dCHF, COPD not on home O2, CKD IIIa, CAD, and IDA who presented with several days increasing dyspnea and dry cough.  In the ER, CXR showed bilateral infiltrates. Admitted on IV Lasix, empiric antibiotics.

## 2023-08-31 NOTE — Progress Notes (Signed)
  Progress Note   Patient: Julia Hernandez WGN:[id number] DOB: [birth year] DOA: 08/30/2023     0 DOS: the patient was seen and examined on 08/31/2023 at 11:15 AM      Brief hospital course: 87 y.o. F with dCHF, COPD not on home O2, CKD IIIa, CAD, and IDA who presented with several days increasing dyspnea and dry cough.  In the ER, CXR showed bilateral infiltrates. Admitted on IV Lasix, empiric antibiotics.     Assessment and Plan: * Acute respiratory failure with hypoxia (HCC) Patient presented with dyspnea, tachypnea and hypoxia to the 70s.  Weaned now to 2L.    COVID, flu, RSV, and extended RV panel negative.  Procalcitonin negative.  BNP slightly up but no orthopnea or leg sewlling, doubt CHF.  URI symptoms, Xray pattern and recent exposure to sick contact suggests viral pneumonia. - Stop antibiotics - Mucinex - Flutter and IS - Wean O2 as able    Chronic diastolic CHF (congestive heart failure) (HCC) No orthopnea, leg swelling.  BNP at baseline.  No improvement with Lasix.  Doubt CHF.   On PRN Lasix at home. - Hold Lasix - Continue spironolactone, metoprolol - Monitor clinically    COPD (chronic obstructive pulmonary disease) (HCC) No wheezing, acute exacerbation ruled out - Continue ICS/LABA  Hypokalemia - Supplement K  Compression fracture of spine (HCC) Asymptomatic  Anxiety - Continue sertraline  Tobacco abuse    Essential hypertension - Continue metoprolol, lisinopril, spironolactone  Atherosclerotic heart disease of native coronary artery without angina pectoris - Continue aspirin, lisinopril, metoprolol  Mild protein malnutrition (HCC) - Ensure   Stage 3a chronic kidney disease (CKD) (HCC) Cr stable relative to baseline          Subjective: Patient still has a cough, this is productive of yellowish sputum, she has had no wheezing, orthopnea, leg swelling.  Feels like she is improving.     Physical Exam: BP 132/66 (BP Location: Left  Arm)   Pulse 94   Temp 97.7 F (36.5 C) (Oral)   Resp 18   Ht 4\' 8"  (1.422 m)   Wt 38.5 kg   SpO2 92%   BMI 19.03 kg/m   Elderly adult female, hard of hearing, sitting up in recliner, interactive and appropriate RRR, no murmurs, no peripheral edema Respiratory rate seems normal, on supplemental oxygen still, desaturates to 78% when this is taken off, she has coarse breath sounds bilaterally but no rales or wheezes Abdomen soft no tenderness palpation Attention normal, affect normal, judgment and insight appear at baseline, but slightly impaired, generalized weakness, face symmetric, speech fluent    Data Reviewed: BNP 429, troponin normal Procalcitonin negative Lactate normal White blood cell count elevated COVID-negative Chest x-ray with bilateral opacities Echocardiogram shows EF 55 to 60%, grade 1 diastolic dysfunction Hemoglobin and platelets normal Creatinine normal and stable, potassium improved to normal Sodium 139, LFTs normal    Family Communication: None present    Disposition: Status is: Inpatient The patient was presented with cough and hypoxia  She appears to have a viral pneumonia.  Obviously there is some ambiguity whether she has CHF, or COPD exacerbation contributing, but my feeling is that she does not  We will treat supportively over the next 48 hours, wean oxygen, and hopefully transition to rehab by the end of the week        Author: Alberteen Sam, MD 08/31/2023 5:44 PM  For on call review [email or web address].

## 2023-08-31 NOTE — Progress Notes (Signed)
Echocardiogram 2D Echocardiogram has been performed.  Julia Hernandez 08/31/2023, 9:03 AM

## 2023-08-31 NOTE — Assessment & Plan Note (Signed)
Continue sertraline 

## 2023-08-31 NOTE — Assessment & Plan Note (Signed)
No wheezing, acute exacerbation ruled out - Continue ICS/LABA

## 2023-08-31 NOTE — Assessment & Plan Note (Signed)
Resolved with supplementation and starting spironolactone. 

## 2023-09-01 ENCOUNTER — Inpatient Hospital Stay (HOSPITAL_COMMUNITY): Payer: Medicare HMO

## 2023-09-01 DIAGNOSIS — J9601 Acute respiratory failure with hypoxia: Secondary | ICD-10-CM | POA: Diagnosis not present

## 2023-09-01 LAB — CBC
HCT: 41 % (ref 36.0–46.0)
Hemoglobin: 12.2 g/dL (ref 12.0–15.0)
MCH: 28 pg (ref 26.0–34.0)
MCHC: 29.8 g/dL — ABNORMAL LOW (ref 30.0–36.0)
MCV: 94.3 fL (ref 80.0–100.0)
Platelets: 309 10*3/uL (ref 150–400)
RBC: 4.35 MIL/uL (ref 3.87–5.11)
RDW: 13.5 % (ref 11.5–15.5)
WBC: 10.9 10*3/uL — ABNORMAL HIGH (ref 4.0–10.5)
nRBC: 0 % (ref 0.0–0.2)

## 2023-09-01 LAB — BASIC METABOLIC PANEL
Anion gap: 7 (ref 5–15)
BUN: 26 mg/dL — ABNORMAL HIGH (ref 8–23)
CO2: 38 mmol/L — ABNORMAL HIGH (ref 22–32)
Calcium: 8.7 mg/dL — ABNORMAL LOW (ref 8.9–10.3)
Chloride: 94 mmol/L — ABNORMAL LOW (ref 98–111)
Creatinine, Ser: 0.88 mg/dL (ref 0.44–1.00)
GFR, Estimated: >60 mL/min (ref >60)
Glucose, Bld: 90 mg/dL (ref 70–99)
Potassium: 3.4 mmol/L — ABNORMAL LOW (ref 3.5–5.1)
Sodium: 139 mmol/L (ref 135–145)

## 2023-09-01 LAB — PROCALCITONIN: Procalcitonin: <0.1 ng/mL

## 2023-09-01 MED ORDER — ACETAZOLAMIDE ER 500 MG PO CP12: 500.0000 mg | ORAL_CAPSULE | Freq: Two times a day (BID) | ORAL | Status: DC

## 2023-09-01 MED ORDER — ACETAZOLAMIDE 250 MG PO TABS
500.0000 mg | ORAL_TABLET | Freq: Every day | ORAL | Status: DC
  Administered 2023-09-01 – 2023-09-04 (×4): 500 mg via ORAL
  Filled 2023-09-01 (×6): qty 2

## 2023-09-01 NOTE — Progress Notes (Signed)
Inpatient Rehab Admissions Coordinator:   Per therapy recommendations, patient was screened for CIR candidacy by Megan Salon, MS, CCC-SLP. At this time, Pt. does not require the intensity of CIR, as she is independent with transfers, supervision for ambulation and PT recommends no follow up. I will not pursue a rehab consult for this Pt.   Recommend other rehab venues to be pursued.  Please contact me with any questions.  Megan Salon, MS, CCC-SLP Rehab Admissions Coordinator  [phone] (celll) [phone] (office)

## 2023-09-01 NOTE — Evaluation (Addendum)
Physical Therapy Evaluation Patient Details Name: Julia Hernandez MRN: [id number] DOB: 26-Dec-1935 Today's Date: 09/01/2023  History of Present Illness  87 y.o. F with dCHF, COPD not on home O2, CKD IIIa, CAD, and IDA who presented with several days increasing dyspnea and dry cough.  In the ER, CXR showed bilateral infiltrates. Dx of acute respiratory failure.  Clinical Impression  Pt admitted with above diagnosis. Pt ambulated 100' without an assistive device, no loss of balance, SpO2 78-83% on room air walking, 90% on room air at rest.  Pt currently with functional limitations due to the deficits listed below (see PT Problem List). Pt will benefit from acute skilled PT to increase their independence and safety with mobility to allow discharge.           If plan is discharge home, recommend the following:  At present she qualifies for home O2.   Can travel by private vehicle        Equipment Recommendations None recommended by PT  Recommendations for Other Services       Functional Status Assessment Patient has had a recent decline in their functional status and demonstrates the ability to make significant improvements in function in a reasonable and predictable amount of time.     Precautions / Restrictions Precautions Precautions: Fall Precaution Comments: denies falls in past 6 months, monitor O2 Restrictions Weight Bearing Restrictions: No      Mobility  Bed Mobility               General bed mobility comments: up in recliner    Transfers Overall transfer level: Independent Equipment used: None                    Ambulation/Gait Ambulation/Gait assistance: Supervision Gait Distance (Feet): 100 Feet Assistive device: None Gait Pattern/deviations: Decreased stride length, Step-through pattern Gait velocity: WFL     General Gait Details: steady, no loss of balance, SpO2 78% room air walking, 90% room air at rest. Distance limited by 2/4 dyspnea. VCs  for pursed lip breathing.  Stairs            Wheelchair Mobility     Tilt Bed    Modified Rankin (Stroke Patients Only)       Balance Overall balance assessment: No apparent balance deficits (not formally assessed)                                           Pertinent Vitals/Pain Pain Assessment Pain Assessment: 0-10 Pain Score: 0-No pain    Home Living Family/patient expects to be discharged to:: Private residence Living Arrangements: Alone Available Help at Discharge: Family;Available PRN/intermittently Type of Home: Apartment Home Access: Level entry       Home Layout: One level Home Equipment: Rollator (4 wheels);Rolling Walker (2 wheels);Shower seat;Grab bars - tub/shower      Prior Function Prior Level of Function : Independent/Modified Independent;Driving             Mobility Comments:  (Independent with ambulation.) ADLs Comments:  (Independent with ADLs, driving, cooking, and cleaning.)     Extremity/Trunk Assessment   Upper Extremity Assessment Upper Extremity Assessment: Overall WFL for tasks assessed;Right hand dominant    Lower Extremity Assessment Lower Extremity Assessment: Overall WFL for tasks assessed    Cervical / Trunk Assessment Cervical / Trunk Assessment: Normal  Communication   Communication Communication: No  apparent difficulties  Cognition Arousal: Alert Behavior During Therapy: WFL for tasks assessed/performed Overall Cognitive Status: Within Functional Limits for tasks assessed                                          General Comments      Exercises     Assessment/Plan    PT Assessment Patient needs continued PT services  PT Problem List Cardiopulmonary status limiting activity;Decreased activity tolerance       PT Treatment Interventions Gait training;Functional mobility training;Therapeutic activities;Therapeutic exercise;Patient/family education    PT Goals (Current  goals can be found in the Care Plan section)  Acute Rehab PT Goals Patient Stated Goal: likes to dance PT Goal Formulation: With patient Time For Goal Achievement: 09/15/23 Potential to Achieve Goals: Good    Frequency Min 1X/week     Co-evaluation PT/OT/SLP Co-Evaluation/Treatment: Yes Reason for Co-Treatment: To address functional/ADL transfers PT goals addressed during session: Mobility/safety with mobility;Balance OT goals addressed during session: ADL's and self-care       AM-PAC PT "6 Clicks" Mobility  Outcome Measure Help needed turning from your back to your side while in a flat bed without using bedrails?: None Help needed moving from lying on your back to sitting on the side of a flat bed without using bedrails?: None Help needed moving to and from a bed to a chair (including a wheelchair)?: None Help needed standing up from a chair using your arms (e.g., wheelchair or bedside chair)?: None Help needed to walk in hospital room?: None Help needed climbing 3-5 steps with a railing? : None 6 Click Score: 24    End of Session Equipment Utilized During Treatment: Gait belt Activity Tolerance: Patient limited by fatigue Patient left: in chair;with call bell/phone within reach;with chair alarm set Nurse Communication: Mobility status PT Visit Diagnosis: Difficulty in walking, not elsewhere classified (R26.2)    Time: 1610-9604 PT Time Calculation (min) (ACUTE ONLY): 12 min   Charges:   PT Evaluation $PT Eval Moderate Complexity: 1 Mod   PT General Charges $$ ACUTE PT VISIT: 1 Visit        Tamala Ser PT 09/01/2023  Acute Rehabilitation Services  Office [phone]

## 2023-09-01 NOTE — Progress Notes (Signed)
HOSPITALIST ROUNDING NOTE Julia Hernandez WNU:[id number]  DOB: 24-Feb-1936  DOA: 08/30/2023  PCP: Sigmund Hazel, MD  09/01/2023,12:12 PM   LOS: 1 day      Code Status: DNR  From: Home  current Dispo: Rehab     87 year old white female HFpEF COPD not on home oxygen at baseline-continues to use tobacco CKD 3 AA CAD + angioplasty graft 2020 [facility]-previously on Brilinta/ASA Previous admission 03/2022 for intermittent black stools-EGD showed no bleeding however esophageal stenosis was noted and this was dilated by [facility] gastroenterology and she was sent home on dysphagia 3 diet   Last hospitalized 9/13 through 06/26/2022 with respiratory failure secondary to diastolic CHF Was recently changed from Lasix to Aldactone by PCP?  Chart review 07/07/2022 shows they added 12.5 Aldactone and change Lasix to as needed for 3 pound weight gain or lower extremity edema   Plan  Acute respiratory failure on admission with hypoxia--DDx pneumonitis versus subclinical aspiration Flu COVID RSV respiratory viral panel negative BNP slightly up seems stable procalcitonin negative Await CXR repeat-my over read does not show any findings-antibiotics were stopped earlier in hospital stay Tells me that she has to be very careful when she eats-discussed with GI-scenario consistent with possible pneumonitis do not think this is bronchitis Barium swallow ordered at the request and this pending Can continue Magic mouthwash at this time with lidocaine  Chronic HFpEF BNP at baseline Lasix held, Aldactone held secondary to rising bicarb now 38 Add Diamox 500 Continues on lisinopril 2.5 daily Monitor labs a.m.--- might resume Aldactone if stabilizes  Underlying COPD continued smoker Trace wheeze, continue Dulera 2 puff twice daily, Mucinex 600 twice daily  HTN Continue Toprol-XL 12.5 at bedtime Monitor trends  Normocytic anemia Continue iron sulfate 325  Leukocytosis Nonspecific but etiology unclear-could  be a pneumonitis?  Anxiety Continue Valium 2.5 daily as needed, sertraline 100 at bedtime   moderate protein calorie malnutrition Stable stage IIIa CKD  DVT prophylaxis:   Lovenox  Status is: Inpatient Remains inpatient appropriate because:   Requires further management in hospital  Subjective: Awake coherent breathing comfortably occasional cough while eating has to be careful when she does eat No chest pain no fever No nausea no vomiting  Objective + exam Vitals:   09/01/23 0424 09/01/23 0500 09/01/23 0840 09/01/23 1201  BP: (!) 140/90  (!) 117/57   Pulse: 76  74   Resp: 19     Temp: 97.9 F (36.6 C)     TempSrc: Oral     SpO2: 93%  92% (!) 78%  Weight:  39.3 kg    Height:       Filed Weights   08/30/23 1419 08/31/23 0500 09/01/23 0500  Weight: 39 kg 38.5 kg 39.3 kg    Examination:  Frail white female no distress EOMI NCAT no focal deficit no icterus no pallor Mild wheeze posteriorly S1-S2 no murmur s seems to be off monitors actually Abdomen soft no rebound no guarding ROM intact Power 5/5 intact  Data Reviewed: reviewed   CBC    Component Value Date/Time   WBC 10.9 (H) 09/01/2023 0511   RBC 4.35 09/01/2023 0511   HGB 12.2 09/01/2023 0511   HCT 41.0 09/01/2023 0511   PLT 309 09/01/2023 0511   MCV 94.3 09/01/2023 0511   MCH 28.0 09/01/2023 0511   MCHC 29.8 (L) 09/01/2023 0511   RDW 13.5 09/01/2023 0511   LYMPHSABS 0.9 08/30/2023 1000   MONOABS 0.7 08/30/2023 1000   EOSABS 0.2 08/30/2023 1000  BASOSABS 0.0 08/30/2023 1000      Latest Ref Rng & Units 09/01/2023    5:11 AM 08/31/2023    9:34 AM 08/30/2023   10:00 AM  CMP  Glucose 70 - 99 mg/dL 90  474  259   BUN 8 - 23 mg/dL 26  18  11    Creatinine 0.44 - 1.00 mg/dL 5.63  8.75  6.43   Sodium 135 - 145 mmol/L 139  141  139   Potassium 3.5 - 5.1 mmol/L 3.4  3.8  3.4   Chloride 98 - 111 mmol/L 94  95  96   CO2 22 - 32 mmol/L 38  35  32   Calcium 8.9 - 10.3 mg/dL 8.7  9.0  9.0   Total  Protein 6.5 - 8.1 g/dL   7.5   Total Bilirubin <1.2 mg/dL   0.7   Alkaline Phos 38 - 126 U/L   76   AST 15 - 41 U/L   13   ALT 0 - 44 U/L   10      Scheduled Meds:  acetaZOLAMIDE  500 mg Oral Daily   aspirin EC  81 mg Oral Daily   enoxaparin (LOVENOX) injection  30 mg Subcutaneous Q24H   feeding supplement  237 mL Oral BID BM   ferrous sulfate  325 mg Oral Q breakfast   guaiFENesin  600 mg Oral BID   lisinopril  2.5 mg Oral Daily   metoprolol succinate  12.5 mg Oral Daily   mometasone-formoterol  2 puff Inhalation BID   sertraline  100 mg Oral QHS   Continuous Infusions:  Time  46  Rhetta Mura, MD  [facility]

## 2023-09-01 NOTE — Evaluation (Signed)
Occupational Therapy Evaluation Patient Details Name: Julia Hernandez MRN: [id number] DOB: Jul 07, 1936 Today's Date: 09/01/2023   History of Present Illness Julia Hernandez is a 87 yr old female admitted to the hospital  with fatigue, wheezing, and shortness of breath. She was found to have acute respiratory failure with hypoxia and COPD exacerbation. PMH: diastolic CHF, COPD not on home O2, CKD IIIa, CAD, asthma, lumbar compression fracture, and IDA    Clinical Impression   The pt is currently limited by the below listed deficits which compromise her ADL performance and overall functional independence (see OT problem list). At current, she also presents with O2 desaturation with activity, slight deconditioning, and compromised activity tolerance. Her O2 saturation was noted to be 90% at rest on room air and 78% on room air with activity.  She will benefit from OT services to maximize her independence with self care tasks & to facilitate her safe return home. Anticipate no post-acute OT needs.       If plan is discharge home, recommend the following: Assist for transportation    Functional Status Assessment  Patient has had a recent decline in their functional status and demonstrates the ability to make significant improvements in function in a reasonable and predictable amount of time.  Equipment Recommendations  None recommended by OT    Recommendations for Other Services       Precautions / Restrictions Precautions Precautions: Fall Precaution Comments: denies falls in past 6 months, monitor O2 Restrictions Weight Bearing Restrictions: No      Mobility Bed Mobility        General bed mobility comments: Pt was received seated in recliner    Transfers Overall transfer level: Needs assistance Equipment used: None Transfers: Sit to/from Stand Sit to Stand: Supervision                  Balance Overall balance assessment: No apparent balance deficits (not formally assessed)            ADL either performed or assessed with clinical judgement   ADL Overall ADL's : Needs assistance/impaired Eating/Feeding: Independent;Sitting   Grooming: Set up;Sitting;Contact guard assist;Standing Grooming Details (indicate cue type and reason): Set-up seated or CGA standing at sink         Upper Body Dressing : Set up;Sitting Upper Body Dressing Details (indicate cue type and reason): simulated at chair level Lower Body Dressing: Set up Lower Body Dressing Details (indicate cue type and reason): The pt doffed then donned her socks in sitting at chair level. Toilet Transfer: Contact guard assist;Regular Toilet;Grab bars;Ambulation Toilet Transfer Details (indicate cue type and reason): At bathroom level, based on clinical judgement                 Vision Baseline Vision/History: 1 Wears glasses       Perception         Praxis         Pertinent Vitals/Pain Pain Assessment Pain Assessment: No/denies pain     Extremity/Trunk Assessment Upper Extremity Assessment Upper Extremity Assessment: Overall WFL for tasks assessed;Right hand dominant   Lower Extremity Assessment Lower Extremity Assessment: Overall WFL for tasks assessed      Communication Communication Communication: No apparent difficulties   Cognition Arousal: Alert Behavior During Therapy: WFL for tasks assessed/performed Overall Cognitive Status: Within Functional Limits for tasks assessed        General Comments: Oriented x4, able to follow commands  Home Living Family/patient expects to be discharged to:: Private residence Living Arrangements: Alone Available Help at Discharge: Family;Available PRN/intermittently Type of Home: Apartment Home Access: Level entry     Home Layout: One level     Bathroom Shower/Tub: Tub/shower unit         Home Equipment: Rollator (4 wheels);Rolling Walker (2 wheels);Shower seat;Grab bars - tub/shower           Prior Functioning/Environment Prior Level of Function : Independent/Modified Independent;Driving             Mobility Comments:  (Independent with ambulation.) ADLs Comments:  (Independent with ADLs, driving, cooking, and cleaning.)        OT Problem List: Cardiopulmonary status limiting activity;Decreased activity tolerance;Decreased knowledge of use of DME or AE      OT Treatment/Interventions: Self-care/ADL training;Therapeutic exercise;Energy conservation;DME and/or AE instruction;Therapeutic activities;Patient/family education    OT Goals(Current goals can be found in the care plan section) Acute Rehab OT Goals OT Goal Formulation: With patient Time For Goal Achievement: 09/15/23 Potential to Achieve Goals: Good ADL Goals Pt Will Perform Grooming: with modified independence;standing Pt Will Perform Lower Body Dressing: with modified independence;sit to/from stand Pt Will Transfer to Toilet: with modified independence;ambulating Pt Will Perform Toileting - Clothing Manipulation and hygiene: with modified independence;sit to/from stand  OT Frequency: Min 1X/week    Co-evaluation PT/OT/SLP Co-Evaluation/Treatment: Yes Reason for Co-Treatment: To address functional/ADL transfers PT goals addressed during session: Mobility/safety with mobility;Balance OT goals addressed during session: ADL's and self-care      AM-PAC OT "6 Clicks" Daily Activity     Outcome Measure Help from another person eating meals?: None Help from another person taking care of personal grooming?: None Help from another person toileting, which includes using toliet, bedpan, or urinal?: A Little Help from another person bathing (including washing, rinsing, drying)?: A Little Help from another person to put on and taking off regular upper body clothing?: A Little Help from another person to put on and taking off regular lower body clothing?: A Little 6 Click Score: 20   End of Session Equipment  Utilized During Treatment: Other (comment) (N/A) Nurse Communication: Other (comment) (O2 saturation at rest and with activity)  Activity Tolerance: Other (comment) (Limited by O2 desaturation with activity) Patient left: in chair;with call bell/phone within reach;with chair alarm set  OT Visit Diagnosis: Muscle weakness (generalized) (M62.81)                Time: 0454-0981 OT Time Calculation (min): 25 min Charges:  OT General Charges $OT Visit: 1 Visit OT Evaluation $OT Eval Moderate Complexity: 1 Mod    Tammy Ericsson L Tashon Capp, OTR/L 09/01/2023, 12:20 PM

## 2023-09-01 NOTE — Progress Notes (Signed)
SATURATION QUALIFICATIONS: (This note is used to comply with regulatory documentation for home oxygen)  Patient Saturations on Room Air at Rest = 90%  Patient Saturations on Room Air while Ambulating = 78%  Patient Saturations on TBD Liters of oxygen while Ambulating = --%  Please briefly explain why patient needs home oxygen: to maintain appropriate SpO2 levels.   Ralene Bathe Kistler PT 09/01/2023  Acute Rehabilitation Services  Office [phone]

## 2023-09-02 ENCOUNTER — Inpatient Hospital Stay (HOSPITAL_COMMUNITY): Payer: Medicare HMO

## 2023-09-02 DIAGNOSIS — J9601 Acute respiratory failure with hypoxia: Secondary | ICD-10-CM | POA: Diagnosis not present

## 2023-09-02 LAB — CBC
HCT: 42.6 % (ref 36.0–46.0)
Hemoglobin: 12.8 g/dL (ref 12.0–15.0)
MCH: 29.4 pg (ref 26.0–34.0)
MCHC: 30 g/dL (ref 30.0–36.0)
MCV: 97.7 fL (ref 80.0–100.0)
Platelets: 337 10*3/uL (ref 150–400)
RBC: 4.36 MIL/uL (ref 3.87–5.11)
RDW: 13.7 % (ref 11.5–15.5)
WBC: 10.2 10*3/uL (ref 4.0–10.5)
nRBC: 0 % (ref 0.0–0.2)

## 2023-09-02 LAB — CBC WITH DIFFERENTIAL/PLATELET
Abs Immature Granulocytes: 0.07 10*3/uL (ref 0.00–0.07)
Basophils Absolute: 0 10*3/uL (ref 0.0–0.1)
Basophils Relative: 0 %
Eosinophils Absolute: 0.1 10*3/uL (ref 0.0–0.5)
Eosinophils Relative: 0 %
HCT: 42.1 % (ref 36.0–46.0)
Hemoglobin: 12.7 g/dL (ref 12.0–15.0)
Immature Granulocytes: 1 %
Lymphocytes Relative: 14 %
Lymphs Abs: 1.7 10*3/uL (ref 0.7–4.0)
MCH: 29.3 pg (ref 26.0–34.0)
MCHC: 30.2 g/dL (ref 30.0–36.0)
MCV: 97.2 fL (ref 80.0–100.0)
Monocytes Absolute: 0.6 10*3/uL (ref 0.1–1.0)
Monocytes Relative: 5 %
Neutro Abs: 9.2 10*3/uL — ABNORMAL HIGH (ref 1.7–7.7)
Neutrophils Relative %: 80 %
Platelets: 364 10*3/uL (ref 150–400)
RBC: 4.33 MIL/uL (ref 3.87–5.11)
RDW: 13.8 % (ref 11.5–15.5)
WBC: 11.6 10*3/uL — ABNORMAL HIGH (ref 4.0–10.5)
nRBC: 0 % (ref 0.0–0.2)

## 2023-09-02 LAB — COMPREHENSIVE METABOLIC PANEL
ALT: 9 U/L (ref 0–44)
ALT: 10 U/L (ref 0–44)
AST: 14 U/L — ABNORMAL LOW (ref 15–41)
AST: 15 U/L (ref 15–41)
Albumin: 3 g/dL — ABNORMAL LOW (ref 3.5–5.0)
Albumin: 3.1 g/dL — ABNORMAL LOW (ref 3.5–5.0)
Alkaline Phosphatase: 73 U/L (ref 38–126)
Alkaline Phosphatase: 70 U/L (ref 38–126)
Anion gap: 9 (ref 5–15)
Anion gap: 10 (ref 5–15)
BUN: 23 mg/dL (ref 8–23)
BUN: 21 mg/dL (ref 8–23)
CO2: 37 mmol/L — ABNORMAL HIGH (ref 22–32)
CO2: 32 mmol/L (ref 22–32)
Calcium: 9.3 mg/dL (ref 8.9–10.3)
Calcium: 9.2 mg/dL (ref 8.9–10.3)
Chloride: 98 mmol/L (ref 98–111)
Chloride: 97 mmol/L — ABNORMAL LOW (ref 98–111)
Creatinine, Ser: 1.13 mg/dL — ABNORMAL HIGH (ref 0.44–1.00)
Creatinine, Ser: 1.17 mg/dL — ABNORMAL HIGH (ref 0.44–1.00)
GFR, Estimated: 48 mL/min — ABNORMAL LOW (ref >60)
GFR, Estimated: 46 mL/min — ABNORMAL LOW (ref >60)
Glucose, Bld: 83 mg/dL (ref 70–99)
Glucose, Bld: 114 mg/dL — ABNORMAL HIGH (ref 70–99)
Potassium: 4.5 mmol/L (ref 3.5–5.1)
Potassium: 3.8 mmol/L (ref 3.5–5.1)
Sodium: 144 mmol/L (ref 135–145)
Sodium: 139 mmol/L (ref 135–145)
Total Bilirubin: 0.6 mg/dL (ref <1.2)
Total Bilirubin: 0.7 mg/dL (ref <1.2)
Total Protein: 7.2 g/dL (ref 6.5–8.1)
Total Protein: 7.3 g/dL (ref 6.5–8.1)

## 2023-09-02 LAB — LACTIC ACID, PLASMA: Lactic Acid, Venous: 0.9 mmol/L (ref 0.5–1.9)

## 2023-09-02 MED ORDER — FUROSEMIDE 20 MG PO TABS
20.0000 mg | ORAL_TABLET | Freq: Every day | ORAL | Status: DC
  Administered 2023-09-02 – 2023-09-04 (×3): 20 mg via ORAL
  Filled 2023-09-02 (×3): qty 1

## 2023-09-02 MED ORDER — SODIUM CHLORIDE 0.9 % IV SOLN: INTRAVENOUS | Status: DC

## 2023-09-02 NOTE — Progress Notes (Signed)
HOSPITALIST ROUNDING NOTE Julia Hernandez UJW:[id number]  DOB: 01/27/1936  DOA: 08/30/2023  PCP: Sigmund Hazel, MD  09/02/2023,1:34 PM   LOS: 2 days      Code Status: DNR  From: Home  current Dispo: Rehab     87 year old white female HFpEF COPD not on home oxygen at baseline-continues to use tobacco CKD 3 AA CAD + angioplasty graft 2020 [facility]-previously on Brilinta/ASA Previous admission 03/2022 for intermittent black stools-EGD showed no bleeding however esophageal stenosis was noted and this was dilated by [facility] gastroenterology and she was sent home on dysphagia 3 diet   Last hospitalized 9/13 through 06/26/2022 with respiratory failure secondary to diastolic CHF Was recently changed from Lasix to Aldactone by PCP?  Chart review 07/07/2022 shows they added 12.5 Aldactone and change Lasix to as needed for 3 pound weight gain or lower extremity edema 11/20 barium swallow shows that barium tablet got stuck--CXR repeat 11/20 showed some fluid in the lungs GI consulted subsequently  Plan  Acute respiratory failure on admission with hypoxia--DDx pneumonitis versus subclinical aspiration Flu COVID RSV respiratory viral panel negative BNP slightly up seems stable procalcitonin negative Can continue Magic mouthwash at this time with lidocaine Might have a pneumonitis from aspiration as had barium swallow as above  Esophageal stricture? Was dilated previously 03/31/2022-barium single contrast shows sticking of tablet so Dr. Pati Gallo of GI consulted-going for endoscopy in a.m. 11/22  Chronic HFpEF BNP at baseline We did hold all diuretics previously including Lasix Aldactone 2/2 rising bicarb Added Diamox 500 Continues on lisinopril 2.5 daily Monitor labs a.m.--will resume low-dose Lasix 20 mg today  Underlying COPD continued smoker Trace wheeze, continue Dulera 2 puff twice daily, Mucinex 600 twice daily  HTN Continue Toprol-XL 12.5 at bedtime Monitor trends  Normocytic  anemia Continue iron sulfate 325  Leukocytosis Nonspecific --?  Resolving  Anxiety Continue Valium 2.5 daily as needed, sertraline 100 at bedtime   moderate protein calorie malnutrition  Stable stage IIIa CKD Watch labs periodically with resumption of diuretics  DVT prophylaxis:   Lovenox  Status is: Inpatient Remains inpatient appropriate because:    Requires barium swallow  Subjective: Looks comfortable eating potato soup tolerating it No chest pain No fever  Objective + exam Vitals:   09/02/23 0854 09/02/23 0927 09/02/23 1135 09/02/23 1330  BP:  (!) 112/54  (!) 119/55  Pulse:  65  82  Resp:    18  Temp:    97.8 F (36.6 C)  TempSrc:    Oral  SpO2: 98%  92% 93%  Weight:      Height:       Filed Weights   08/31/23 0500 09/01/23 0500 09/02/23 0447  Weight: 38.5 kg 39.3 kg 41.6 kg    Examination:  EOMI NCAT no focal deficit no icterus no pallor Mild wheeze posteriorly on the right side especially S1-S2 no murmur s seems to be off monitors actually Abdomen soft no rebound no guarding ROM intact Power 5/5 intact  Data Reviewed: reviewed   CBC    Component Value Date/Time   WBC 10.2 09/02/2023 0531   RBC 4.36 09/02/2023 0531   HGB 12.8 09/02/2023 0531   HCT 42.6 09/02/2023 0531   PLT 337 09/02/2023 0531   MCV 97.7 09/02/2023 0531   MCH 29.4 09/02/2023 0531   MCHC 30.0 09/02/2023 0531   RDW 13.7 09/02/2023 0531   LYMPHSABS 0.9 08/30/2023 1000   MONOABS 0.7 08/30/2023 1000   EOSABS 0.2 08/30/2023 1000   BASOSABS 0.0  08/30/2023 1000      Latest Ref Rng & Units 09/02/2023    5:31 AM 09/01/2023    5:11 AM 08/31/2023    9:34 AM  CMP  Glucose 70 - 99 mg/dL 83  90  956   BUN 8 - 23 mg/dL 23  26  18    Creatinine 0.44 - 1.00 mg/dL 2.13  0.86  5.78   Sodium 135 - 145 mmol/L 144  139  141   Potassium 3.5 - 5.1 mmol/L 4.5  3.4  3.8   Chloride 98 - 111 mmol/L 98  94  95   CO2 22 - 32 mmol/L 37  38  35   Calcium 8.9 - 10.3 mg/dL 9.3  8.7  9.0    Total Protein 6.5 - 8.1 g/dL 7.2     Total Bilirubin <1.2 mg/dL 0.6     Alkaline Phos 38 - 126 U/L 73     AST 15 - 41 U/L 14     ALT 0 - 44 U/L 9        Scheduled Meds:  acetaZOLAMIDE  500 mg Oral Daily   aspirin EC  81 mg Oral Daily   enoxaparin (LOVENOX) injection  30 mg Subcutaneous Q24H   feeding supplement  237 mL Oral BID BM   ferrous sulfate  325 mg Oral Q breakfast   guaiFENesin  600 mg Oral BID   lisinopril  2.5 mg Oral Daily   metoprolol succinate  12.5 mg Oral Daily   mometasone-formoterol  2 puff Inhalation BID   sertraline  100 mg Oral QHS   Continuous Infusions:  sodium chloride      Time  46  Rhetta Mura, MD  [facility]

## 2023-09-02 NOTE — Progress Notes (Signed)
Occupational Therapy Treatment Patient Details Name: Julia Hernandez MRN: [id number] DOB: Dec 05, 1935 Today's Date: 09/02/2023   History of present illness 87 y.o. F with dCHF, COPD not on home O2, CKD IIIa, CAD, and IDA who presented with several days increasing dyspnea and dry cough.  In the ER, CXR showed bilateral infiltrates. Dx of acute respiratory failure.   OT comments  Patient requesting bathroom use.  Patient able to walk to bathroom with supervision and assist with O2 tank.  May be beneficial to try a long O2 cord for in room management prior to home.  Patient requested mobility in the halls, very similar, generalized supervision.  Patient with one small LOB with turn, but able to recover easily.  OT can continue to follow in the acute setting to address deficits, but no post acute OT is anticipated.  HH can be considered if home transition is questionable.        If plan is discharge home, recommend the following:  Assist for transportation   Equipment Recommendations  None recommended by OT    Recommendations for Other Services      Precautions / Restrictions Precautions Precautions: Fall Restrictions Weight Bearing Restrictions: No       Mobility Bed Mobility               General bed mobility comments: Pt was received seated in recliner Patient Response: Cooperative  Transfers Overall transfer level: Needs assistance Equipment used: None Transfers: Sit to/from Stand, Bed to chair/wheelchair/BSC Sit to Stand: Supervision     Step pivot transfers: Supervision, Contact guard assist           Balance Overall balance assessment: Mild deficits observed, not formally tested                                         ADL either performed or assessed with clinical judgement   ADL Overall ADL's : Needs assistance/impaired     Grooming: Standing;Supervision/safety                   Toilet Transfer: Market researcher;Ambulation;Supervision/safety   Toileting- Clothing Manipulation and Hygiene: Modified independent;Sitting/lateral lean              Extremity/Trunk Assessment Upper Extremity Assessment Upper Extremity Assessment: Overall WFL for tasks assessed   Lower Extremity Assessment Lower Extremity Assessment: Defer to PT evaluation   Cervical / Trunk Assessment Cervical / Trunk Assessment: Normal    Vision Baseline Vision/History: 1 Wears glasses Patient Visual Report: No change from baseline     Perception Perception Perception: Within Functional Limits   Praxis Praxis Praxis: WFL    Cognition Arousal: Alert Behavior During Therapy: WFL for tasks assessed/performed Overall Cognitive Status: Within Functional Limits for tasks assessed                                                       General Comments small LOB with turn in hallway, but no LOB    Pertinent Vitals/ Pain       Pain Assessment Pain Assessment: Faces Faces Pain Scale: No hurt Pain Intervention(s): Monitored during session  Frequency  Min 1X/week        Progress Toward Goals  OT Goals(current goals can now be found in the care plan section)  Progress towards OT goals: Progressing toward goals  Acute Rehab OT Goals OT Goal Formulation: With patient Time For Goal Achievement: 09/15/23 Potential to Achieve Goals: Good  Plan      Co-evaluation                 AM-PAC OT "6 Clicks" Daily Activity     Outcome Measure   Help from another person eating meals?: None Help from another person taking care of personal grooming?: None Help from another person toileting, which includes using toliet, bedpan, or urinal?: A Little Help from another person bathing (including washing, rinsing, drying)?: A Little Help from another person to put on and taking off regular upper body  clothing?: None Help from another person to put on and taking off regular lower body clothing?: A Little 6 Click Score: 21    End of Session Equipment Utilized During Treatment: Gait belt  OT Visit Diagnosis: Muscle weakness (generalized) (M62.81)   Activity Tolerance Patient tolerated treatment well   Patient Left in chair;with call bell/phone within reach;with chair alarm set   Nurse Communication Mobility status        Time: 0102-7253 OT Time Calculation (min): 18 min  Charges: OT General Charges $OT Visit: 1 Visit OT Treatments $Self Care/Home Management : 8-22 mins  09/02/2023  RP, OTR/L  Acute Rehabilitation Services  Office:  [phone]   Suzanna Obey 09/02/2023, 4:00 PM

## 2023-09-02 NOTE — Consult Note (Signed)
[facility] Consult  Referring Provider: [facility] hospitalist/Dr. Mahala Menghini Primary Care Physician:  Sigmund Hazel, MD Primary Gastroenterologist: Deboraha Sprang primary  Reason for Consultation: Abnormal barium swallow, dysphagia  HPI: Julia Hernandez is a 87 y.o. female who was admitted on 08/30/2023 with shortness of breath. She has history of asthma, COPD, hypertension, CAD, chronic diastolic CHF.[name] Patient reports coughing after eating and states she has difficulty swallowing solids as well as liquids, which have been ongoing for over a year, is not progressive but she has lost about 25 pounds in the last 1 year.   Barium swallow 09/01/2023, coughing after eating and drinking: Slight narrowing in upper esophagus through which a 13 mm barium tablet would not pass, esophageal dysmotility.  EGD 03/2022, iron deficiency anemia, dysphagia: Esophageal stenosis at 35 cm, balloon dilation performed by 12 mm balloon, food found in entire esophagus.  Patient states that she takes iron and her stools are black, she struggles with constipation and may have a bowel movement every 2 days. She denies nausea or vomiting but complains of acid reflux as well as heartburn. She denies abdominal or rectal pain.  Past Medical History:  Diagnosis Date   Asthma    COPD (chronic obstructive pulmonary disease) (HCC)    Hypertension     Past Surgical History:  Procedure Laterality Date   BALLOON DILATION N/A 03/24/2022   Procedure: BALLOON DILATION;  Surgeon: Kerin Salen, MD;  Location: [facility];  Service: Gastroenterology;  Laterality: N/A;   ESOPHAGOGASTRODUODENOSCOPY N/A 03/24/2022   Procedure: ESOPHAGOGASTRODUODENOSCOPY (EGD);  Surgeon: Kerin Salen, MD;  Location: [facility];  Service: Gastroenterology;  Laterality: N/A;    Prior to Admission medications   Medication Sig Start Date End Date Taking? Authorizing Provider  acetaminophen (TYLENOL) 500 MG tablet Take 500 mg by mouth 5 (five) times daily.    Yes [provider]  acyclovir ointment (ZOVIRAX) 5 % Apply 1 application topically 3 (three) times daily as needed (outbreaks).   Yes [provider]  aspirin EC 81 MG tablet Take 1 tablet (81 mg total) by mouth daily. Swallow whole. 03/31/22  Yes Rolly Salter, MD  B Complex-C (B-COMPLEX WITH VITAMIN C) tablet Take 1 tablet by mouth daily. 03/31/22  Yes Rolly Salter, MD  diazepam (VALIUM) 5 MG tablet Take 1 tablet (5 mg total) by mouth daily as needed for anxiety. Patient taking differently: Take 2.5 mg by mouth daily as needed for anxiety. 03/30/22  Yes Rolly Salter, MD  ferrous sulfate 325 (65 FE) MG tablet Take 1 tablet (325 mg total) by mouth 2 (two) times daily with a meal. Patient taking differently: Take 325 mg by mouth daily with breakfast. 03/30/22  Yes Rolly Salter, MD  furosemide (LASIX) 20 MG tablet Take 1 tablet (20 mg total) by mouth daily as needed (weight gain of >3 lbs in 1 day or >5 lbs in 1 week). 03/30/22  Yes Rolly Salter, MD  Ipratropium-Albuterol (COMBIVENT RESPIMAT) 20-100 MCG/ACT AERS respimat Inhale 1 puff into the lungs 4 (four) times daily as needed for shortness of breath.   Yes [provider]  magic mouthwash w/lidocaine SOLN Take 5 mLs by mouth 4 (four) times daily as needed for mouth pain.   Yes [provider]  metoprolol succinate (TOPROL-XL) 25 MG 24 hr tablet Take 12.5 mg by mouth at bedtime. 03/02/22  Yes [provider]  oxyCODONE (OXY IR/ROXICODONE) 5 MG immediate release tablet Take 0.5 tablets (2.5 mg total) by mouth every 4 (four)  hours as needed for moderate pain. Patient taking differently: Take 5 mg by mouth every 4 (four) hours as needed for moderate pain (pain score 4-6). 03/30/22  Yes Rolly Salter, MD  pantoprazole (PROTONIX) 40 MG tablet Take 1 tablet (40 mg total) by mouth daily. Patient taking differently: Take 40 mg by mouth daily as needed (for acid reflux). 03/31/22  Yes Rolly Salter, MD   sertraline (ZOLOFT) 100 MG tablet Take 100 mg by mouth at bedtime.   Yes [provider]  spironolactone (ALDACTONE) 25 MG tablet Take 0.5 tablets (12.5 mg total) by mouth daily. 07/07/22  Yes Clegg, Amy D, NP  SYMBICORT 160-4.5 MCG/ACT inhaler Inhale 2 puffs into the lungs 2 (two) times daily. 03/02/22  Yes [provider]    Current Facility-Administered Medications  Medication Dose Route Frequency Provider Last Rate Last Admin   acetaminophen (TYLENOL) tablet 325 mg  325 mg Oral Q6H PRN Bobette Mo, MD       Or   acetaminophen (TYLENOL) suppository 650 mg  650 mg Rectal Q8H PRN Bobette Mo, MD       acetaZOLAMIDE (DIAMOX) tablet 500 mg  500 mg Oral Daily Rhetta Mura, MD   500 mg at 09/01/23 1630   aspirin EC tablet 81 mg  81 mg Oral Daily Bobette Mo, MD   81 mg at 09/01/23 7829   diazepam (VALIUM) tablet 2.5 mg  2.5 mg Oral Daily PRN Bobette Mo, MD       enoxaparin (LOVENOX) injection 30 mg  30 mg Subcutaneous Q24H Bobette Mo, MD   30 mg at 09/01/23 2129   feeding supplement (ENSURE ENLIVE / ENSURE PLUS) liquid 237 mL  237 mL Oral BID BM Bobette Mo, MD   237 mL at 09/01/23 1350   ferrous sulfate tablet 325 mg  325 mg Oral Q breakfast Bobette Mo, MD   325 mg at 09/02/23 5621   guaiFENesin (MUCINEX) 12 hr tablet 600 mg  600 mg Oral BID Alberteen Sam, MD   600 mg at 09/01/23 2127   lisinopril (ZESTRIL) tablet 2.5 mg  2.5 mg Oral Daily Bobette Mo, MD   2.5 mg at 09/01/23 3086   magic mouthwash w/lidocaine  5 mL Oral QID PRN Bobette Mo, MD       metoprolol succinate (TOPROL-XL) 24 hr tablet 12.5 mg  12.5 mg Oral Daily Bobette Mo, MD   12.5 mg at 09/01/23 5784   mometasone-formoterol (DULERA) 200-5 MCG/ACT inhaler 2 puff  2 puff Inhalation BID Bobette Mo, MD   2 puff at 09/01/23 2055   ondansetron [facility]) tablet 4 mg  4 mg Oral Q6H PRN Bobette Mo, MD        Or   ondansetron [facility]) injection 4 mg  4 mg Intravenous Q6H PRN Bobette Mo, MD       oxyCODONE (Oxy IR/ROXICODONE) immediate release tablet 5 mg  5 mg Oral Q4H PRN Bobette Mo, MD       pantoprazole (PROTONIX) EC tablet 40 mg  40 mg Oral Daily PRN Bobette Mo, MD       sertraline (ZOLOFT) tablet 100 mg  100 mg Oral QHS Bobette Mo, MD   100 mg at 09/01/23 2127    Allergies as of 08/30/2023 - Review Complete 08/30/2023  Allergen Reaction Noted   Influenza vac split quad Shortness Of Breath 07/21/2021   Ditropan xl [oxybutynin] Other (See  Comments) 07/21/2021   Simvastatin Rash 07/21/2021    History reviewed. No pertinent family history.  Social History   Socioeconomic History   Marital status: Divorced    Spouse name: Not on file   Number of children: 1   Years of education: Not on file   Highest education level: High school graduate  Occupational History   Occupation: Retired  Tobacco Use   Smoking status: Every Day    Current packs/day: 0.50    Average packs/day: 0.5 packs/day for 60.0 years (30.0 ttl pk-yrs)    Types: Cigarettes   Smokeless tobacco: Never  Vaping Use   Vaping status: Never Used  Substance and Sexual Activity   Alcohol use: No   Drug use: Never   Sexual activity: Not on file  Other Topics Concern   Not on file  Social History Narrative   Not on file   Social Determinants of Health   Financial Resource Strain: Low Risk  (06/26/2022)   Overall Financial Resource Strain (CARDIA)    Difficulty of Paying Living Expenses: Not hard at all  Food Insecurity: No Food Insecurity (08/30/2023)   Hunger Vital Sign    Worried About Running Out of Food in the Last Year: Never true    Ran Out of Food in the Last Year: Never true  Transportation Needs: No Transportation Needs (08/30/2023)   PRAPARE - Administrator, Civil Service (Medical): No    Lack of Transportation (Non-Medical): No  Physical Activity: Not on  file  Stress: Not on file  Social Connections: Unknown (02/12/2022)   Received [facility]   Social Network    Social Network: Not on file  Intimate Partner Violence: Not At Risk (08/30/2023)   Humiliation, Afraid, Rape, and Kick questionnaire    Fear of Current or Ex-Partner: No    Emotionally Abused: No    Physically Abused: No    Sexually Abused: No    Review of Systems: As per HPI Physical Exam: Vital signs in last 24 hours: Temp:  [97.5 F (36.4 C)-98.6 F (37 C)] 97.5 F (36.4 C) (11/21 0401) Pulse Rate:  [68-86] 68 (11/21 0401) Resp:  [16-20] 20 (11/21 0401) BP: (111-129)/(52-72) 111/52 (11/21 0401) SpO2:  [78 %-98 %] 98 % (11/21 0401) Weight:  [41.6 kg] 41.6 kg (11/21 0447) Last BM Date : 09/01/23  General:   Elderly, frail  Head:  Normocephalic and atraumatic. Eyes:  Sclera clear, no icterus.   Conjunctiva pink. Ears:  Normal auditory acuity. Nose:  No deformity, discharge,  or lesions. Mouth:  No deformity or lesions.  Oropharynx pink & moist. Neck:  Supple; no masses or thyromegaly. Lungs: On oxygen via nasal cannula, able to speak in full sentence,  clear throughout to auscultation.   No wheezes, crackles, or rhonchi. No acute distress. Heart:  Regular rate and rhythm; no murmurs, clicks, rubs,  or gallops. Extremities:  Without clubbing or edema. Neurologic:  Alert and  oriented x4;  grossly normal neurologically. Skin:  Intact without significant lesions or rashes. Psych:  Alert and cooperative. Normal mood and affect. Abdomen:  Soft, nontender and nondistended. No masses, hepatosplenomegaly or hernias noted. Normal bowel sounds, without guarding, and without rebound.         Lab Results: Recent Labs    08/31/23 0934 09/01/23 0511 09/02/23 0531  WBC 18.8* 10.9* 10.2  HGB 13.6 12.2 12.8  HCT 43.7 41.0 42.6  PLT 366 309 337   BMET Recent Labs  08/31/23 0934 09/01/23 0511 09/02/23 0531  NA 141 139 144  K 3.8 3.4* 4.5   CL 95* 94* 98  CO2 35* 38* 37*  GLUCOSE 104* 90 83  BUN 18 26* 23  CREATININE 0.92 0.88 1.13*  CALCIUM 9.0 8.7* 9.3   LFT Recent Labs    09/02/23 0531  PROT 7.2  ALBUMIN 3.0*  AST 14*  ALT 9  ALKPHOS 73  BILITOT 0.6   PT/INR No results for input(s): "LABPROT", "INR" in the last 72 hours.  Studies/Results: DG Chest 2 View  Result Date: 09/01/2023 CLINICAL DATA:  Pneumonia. EXAM: CHEST - 2 VIEW COMPARISON:  08/30/2023 and CT chest 03/07/2022. FINDINGS: Trachea is midline. Heart is at the upper limits of normal in size to mildly enlarged, stable. Thoracic aorta is calcified. Very fine interstitial prominence with probable basilar peripheral septal lines. Tiny bilateral pleural effusions. Osteopenia with scattered compression deformities. IMPRESSION: Suspect mild congestive heart failure. Electronically Signed   By: Leanna Battles M.D.   On: 09/01/2023 15:58   DG ESOPHAGUS W SINGLE CM (SOL OR THIN BA)  Result Date: 09/01/2023 CLINICAL DATA:  Coughing after eating and drinking. EXAM: ESOPHOGRAM/BARIUM SWALLOW TECHNIQUE: Single contrast examination was performed using  thin barium. FLUOROSCOPY: Radiation Exposure Index (as provided by the fluoroscopic device): 2.3 mGy Kerma COMPARISON:  None Available. FINDINGS: Limited examination was performed due to patient condition. Somewhat sluggish esophageal motility with proximal escape. Slight narrowing in the upper esophagus, through which a 13 mm barium tablet would not pass. No esophageal fold thickening. IMPRESSION: 1. Slight narrowing in the upper esophagus, through which a 13 mm barium tablet would not pass. 2. Esophageal dysmotility. Electronically Signed   By: Leanna Battles M.D.   On: 09/01/2023 15:55   ECHOCARDIOGRAM COMPLETE  Result Date: 08/31/2023    ECHOCARDIOGRAM REPORT   Patient Name:   KANITA GUTEKUNST Date of Exam: 08/31/2023 Medical Rec #:  [id number]    Height:       56.0 in Accession #:    [id number]   Weight:       84.9 lb  Date of Birth:  December 08, 1935     BSA:          1.233 m Patient Age:    87 years     BP:           134/58 mmHg Patient Gender: F            HR:           82 bpm. Exam Location:  Inpatient Procedure: 2D Echo, 3D Echo, Cardiac Doppler, Color Doppler and Strain Analysis Indications:    CHF Acute Diastolic  History:        Patient has prior history of Echocardiogram examinations, most                 recent 03/20/2022. COPD; Risk Factors:Current Smoker and                 Hypertension.  Sonographer:    Karma Ganja Referring Phys: [id number] DAVID MANUEL ORTIZ  Sonographer Comments: Global longitudinal strain was attempted. IMPRESSIONS  1. Left ventricular ejection fraction, by estimation, is 55 to 60%. Left ventricular ejection fraction by 3D volume is 56 %. The left ventricle has normal function. The left ventricle has no regional wall motion abnormalities. There is moderate left ventricular hypertrophy of the basal-septal segment. Left ventricular diastolic parameters are consistent with Grade I diastolic dysfunction (impaired relaxation). Elevated left ventricular end-diastolic pressure. The  average left ventricular global longitudinal strain is -17.7 %. The global longitudinal strain is normal.  2. Right ventricular systolic function is normal. The right ventricular size is normal.  3. Right atrial size was mildly dilated.  4. The mitral valve is normal in structure. Trivial mitral valve regurgitation. No evidence of mitral stenosis.  5. The aortic valve was not well visualized. Aortic valve regurgitation is not visualized. No aortic stenosis is present. Aortic valve area, by VTI measures 2.21 cm. Aortic valve mean gradient measures 5.0 mmHg. Aortic valve Vmax measures 1.45 m/s.  6. The inferior vena cava is normal in size with greater than 50% respiratory variability, suggesting right atrial pressure of 3 mmHg. FINDINGS  Left Ventricle: Left ventricular ejection fraction, by estimation, is 55 to 60%. Left ventricular  ejection fraction by 3D volume is 56 %. The left ventricle has normal function. The left ventricle has no regional wall motion abnormalities. The average left ventricular global longitudinal strain is -17.7 %. The global longitudinal strain is normal. The left ventricular internal cavity size was normal in size. There is moderate left ventricular hypertrophy of the basal-septal segment. Left ventricular diastolic parameters are consistent with Grade I diastolic dysfunction (impaired relaxation). Elevated left ventricular end-diastolic pressure. Right Ventricle: The right ventricular size is normal. No increase in right ventricular wall thickness. Right ventricular systolic function is normal. Left Atrium: Left atrial size was normal in size. Right Atrium: Right atrial size was mildly dilated. Pericardium: There is no evidence of pericardial effusion. Mitral Valve: The mitral valve is normal in structure. Trivial mitral valve regurgitation. No evidence of mitral valve stenosis. Tricuspid Valve: The tricuspid valve is normal in structure. Tricuspid valve regurgitation is not demonstrated. No evidence of tricuspid stenosis. Aortic Valve: The aortic valve was not well visualized. Aortic valve regurgitation is not visualized. No aortic stenosis is present. Aortic valve mean gradient measures 5.0 mmHg. Aortic valve peak gradient measures 8.4 mmHg. Aortic valve area, by VTI measures 2.21 cm. Pulmonic Valve: The pulmonic valve was normal in structure. Pulmonic valve regurgitation is not visualized. No evidence of pulmonic stenosis. Aorta: The aortic root is normal in size and structure. Venous: The inferior vena cava is normal in size with greater than 50% respiratory variability, suggesting right atrial pressure of 3 mmHg. IAS/Shunts: No atrial level shunt detected by color flow Doppler.  LEFT VENTRICLE PLAX 2D LVIDd:         3.70 cm         Diastology LVIDs:         2.80 cm         LV e' medial:    5.11 cm/s LV PW:          0.90 cm         LV E/e' medial:  18.5 LV IVS:        1.40 cm         LV e' lateral:   5.55 cm/s LVOT diam:     1.80 cm         LV E/e' lateral: 17.0 LV SV:         63 LV SV Index:   51              2D LVOT Area:     2.54 cm        Longitudinal  Strain                                2D Strain GLS  -17.8 %                                (A2C):                                2D Strain GLS  -17.4 %                                (A3C):                                2D Strain GLS  -17.9 %                                (A4C):                                2D Strain GLS  -17.7 %                                Avg:                                 3D Volume EF                                LV 3D EF:    Left                                             ventricul                                             ar                                             ejection                                             fraction                                             by 3D  volume is                                             56 %.                                 3D Volume EF:                                3D EF:        56 %                                LV EDV:       93 ml                                LV ESV:       41 ml                                LV SV:        52 ml RIGHT VENTRICLE RV Basal diam:  3.40 cm RV S prime:     10.40 cm/s TAPSE (M-mode): 2.4 cm LEFT ATRIUM             Index        RIGHT ATRIUM           Index LA diam:        3.50 cm 2.84 cm/m   RA Area:     13.80 cm LA Vol (A2C):   44.1 ml 35.76 ml/m  RA Volume:   35.50 ml  28.79 ml/m LA Vol (A4C):   31.6 ml 25.62 ml/m LA Biplane Vol: 40.3 ml 32.68 ml/m  AORTIC VALVE AV Area (Vmax):    1.91 cm AV Area (Vmean):   1.92 cm AV Area (VTI):     2.21 cm AV Vmax:           145.00 cm/s AV Vmean:          97.700 cm/s AV VTI:            0.286 m AV Peak Grad:      8.4 mmHg AV Mean Grad:       5.0 mmHg LVOT Vmax:         109.00 cm/s LVOT Vmean:        73.700 cm/s LVOT VTI:          0.248 m LVOT/AV VTI ratio: 0.87  AORTA Ao Root diam: 2.60 cm MITRAL VALVE MV Area (PHT): 3.83 cm     SHUNTS MV Decel Time: 198 msec     Systemic VTI:  0.25 m MV E velocity: 94.30 cm/s   Systemic Diam: 1.80 cm MV A velocity: 127.00 cm/s MV E/A ratio:  0.74 Armanda Magic MD Electronically signed by Armanda Magic MD Signature Date/Time: 08/31/2023/10:13:53 AM    Final     Impression: Dysphagia, history of esophageal stricture requiring balloon dilation with 12 mm balloon, abnormal barium swallow showed slight narrowing in upper esophagus through which a 13 mm barium tablet would not pass,  esophageal dysmotility.  Lab abnormalities: Creatinine 1.13, GFR 48, albumin 3  Multiple comorbidities: Chronic heart failure, COPD, hypertension, anxiety  Plan: Diagnostic EGD with balloon dilation tomorrow. The risks and the benefits of the procedure were discussed with the patient in details. She understands and verbalizes consent.   LOS: 2 days   Kerin Salen, MD  09/02/2023, 8:21 AM

## 2023-09-02 NOTE — H&P (View-Only) (Signed)
[facility] Consult  Referring Provider: [facility]/Dr. Mahala Menghini Primary Care Physician:  Sigmund Hazel, MD Primary Gastroenterologist: Deboraha Sprang primary  Reason for Consultation: Abnormal barium swallow, dysphagia  HPI: Julia Hernandez is a 87 y.o. female who was admitted on 08/30/2023 with shortness of breath. She has history of asthma, COPD, hypertension, CAD, chronic diastolic CHF.[name] Patient reports coughing after eating and states she has difficulty swallowing solids as well as liquids, which have been ongoing for over a year, is not progressive but she has lost about 25 pounds in the last 1 year.   Barium swallow 09/01/2023, coughing after eating and drinking: Slight narrowing in upper esophagus through which a 13 mm barium tablet would not pass, esophageal dysmotility.  EGD 03/2022, iron deficiency anemia, dysphagia: Esophageal stenosis at 35 cm, balloon dilation performed by 12 mm balloon, food found in entire esophagus.  Patient states that she takes iron and her stools are black, she struggles with constipation and may have a bowel movement every 2 days. She denies nausea or vomiting but complains of acid reflux as well as heartburn. She denies abdominal or rectal pain.  Past Medical History:  Diagnosis Date   Asthma    COPD (chronic obstructive pulmonary disease) (HCC)    Hypertension     Past Surgical History:  Procedure Laterality Date   BALLOON DILATION N/A 03/24/2022   Procedure: BALLOON DILATION;  Surgeon: Kerin Salen, MD;  Location: [facility];  Service: Gastroenterology;  Laterality: N/A;   ESOPHAGOGASTRODUODENOSCOPY N/A 03/24/2022   Procedure: ESOPHAGOGASTRODUODENOSCOPY (EGD);  Surgeon: Kerin Salen, MD;  Location: [facility];  Service: Gastroenterology;  Laterality: N/A;    Prior to Admission medications   Medication Sig Start Date End Date Taking? Authorizing Provider  acetaminophen (TYLENOL) 500 MG tablet Take 500 mg by mouth 5 (five) times daily.    Yes [provider]  acyclovir ointment (ZOVIRAX) 5 % Apply 1 application topically 3 (three) times daily as needed (outbreaks).   Yes [provider]  aspirin EC 81 MG tablet Take 1 tablet (81 mg total) by mouth daily. Swallow whole. 03/31/22  Yes Rolly Salter, MD  B Complex-C (B-COMPLEX WITH VITAMIN C) tablet Take 1 tablet by mouth daily. 03/31/22  Yes Rolly Salter, MD  diazepam (VALIUM) 5 MG tablet Take 1 tablet (5 mg total) by mouth daily as needed for anxiety. Patient taking differently: Take 2.5 mg by mouth daily as needed for anxiety. 03/30/22  Yes Rolly Salter, MD  ferrous sulfate 325 (65 FE) MG tablet Take 1 tablet (325 mg total) by mouth 2 (two) times daily with a meal. Patient taking differently: Take 325 mg by mouth daily with breakfast. 03/30/22  Yes Rolly Salter, MD  furosemide (LASIX) 20 MG tablet Take 1 tablet (20 mg total) by mouth daily as needed (weight gain of >3 lbs in 1 day or >5 lbs in 1 week). 03/30/22  Yes Rolly Salter, MD  Ipratropium-Albuterol (COMBIVENT RESPIMAT) 20-100 MCG/ACT AERS respimat Inhale 1 puff into the lungs 4 (four) times daily as needed for shortness of breath.   Yes [provider]  magic mouthwash w/lidocaine SOLN Take 5 mLs by mouth 4 (four) times daily as needed for mouth pain.   Yes [provider]  metoprolol succinate (TOPROL-XL) 25 MG 24 hr tablet Take 12.5 mg by mouth at bedtime. 03/02/22  Yes [provider]  oxyCODONE (OXY IR/ROXICODONE) 5 MG immediate release tablet Take 0.5 tablets (2.5 mg total) by mouth every 4 (four)  hours as needed for moderate pain. Patient taking differently: Take 5 mg by mouth every 4 (four) hours as needed for moderate pain (pain score 4-6). 03/30/22  Yes Rolly Salter, MD  pantoprazole (PROTONIX) 40 MG tablet Take 1 tablet (40 mg total) by mouth daily. Patient taking differently: Take 40 mg by mouth daily as needed (for acid reflux). 03/31/22  Yes Rolly Salter, MD   sertraline (ZOLOFT) 100 MG tablet Take 100 mg by mouth at bedtime.   Yes [provider]  spironolactone (ALDACTONE) 25 MG tablet Take 0.5 tablets (12.5 mg total) by mouth daily. 07/07/22  Yes Clegg, Amy D, NP  SYMBICORT 160-4.5 MCG/ACT inhaler Inhale 2 puffs into the lungs 2 (two) times daily. 03/02/22  Yes [provider]    Current Facility-Administered Medications  Medication Dose Route Frequency Provider Last Rate Last Admin   acetaminophen (TYLENOL) tablet 325 mg  325 mg Oral Q6H PRN Bobette Mo, MD       Or   acetaminophen (TYLENOL) suppository 650 mg  650 mg Rectal Q8H PRN Bobette Mo, MD       acetaZOLAMIDE (DIAMOX) tablet 500 mg  500 mg Oral Daily Rhetta Mura, MD   500 mg at 09/01/23 1630   aspirin EC tablet 81 mg  81 mg Oral Daily Bobette Mo, MD   81 mg at 09/01/23 1610   diazepam (VALIUM) tablet 2.5 mg  2.5 mg Oral Daily PRN Bobette Mo, MD       enoxaparin (LOVENOX) injection 30 mg  30 mg Subcutaneous Q24H Bobette Mo, MD   30 mg at 09/01/23 2129   feeding supplement (ENSURE ENLIVE / ENSURE PLUS) liquid 237 mL  237 mL Oral BID BM Bobette Mo, MD   237 mL at 09/01/23 1350   ferrous sulfate tablet 325 mg  325 mg Oral Q breakfast Bobette Mo, MD   325 mg at 09/02/23 9604   guaiFENesin (MUCINEX) 12 hr tablet 600 mg  600 mg Oral BID Alberteen Sam, MD   600 mg at 09/01/23 2127   lisinopril (ZESTRIL) tablet 2.5 mg  2.5 mg Oral Daily Bobette Mo, MD   2.5 mg at 09/01/23 5409   magic mouthwash w/lidocaine  5 mL Oral QID PRN Bobette Mo, MD       metoprolol succinate (TOPROL-XL) 24 hr tablet 12.5 mg  12.5 mg Oral Daily Bobette Mo, MD   12.5 mg at 09/01/23 8119   mometasone-formoterol (DULERA) 200-5 MCG/ACT inhaler 2 puff  2 puff Inhalation BID Bobette Mo, MD   2 puff at 09/01/23 2055   ondansetron [facility]) tablet 4 mg  4 mg Oral Q6H PRN Bobette Mo, MD        Or   ondansetron [facility]) injection 4 mg  4 mg Intravenous Q6H PRN Bobette Mo, MD       oxyCODONE (Oxy IR/ROXICODONE) immediate release tablet 5 mg  5 mg Oral Q4H PRN Bobette Mo, MD       pantoprazole (PROTONIX) EC tablet 40 mg  40 mg Oral Daily PRN Bobette Mo, MD       sertraline (ZOLOFT) tablet 100 mg  100 mg Oral QHS Bobette Mo, MD   100 mg at 09/01/23 2127    Allergies as of 08/30/2023 - Review Complete 08/30/2023  Allergen Reaction Noted   Influenza vac split quad Shortness Of Breath 07/21/2021   Ditropan xl [oxybutynin] Other (See  Comments) 07/21/2021   Simvastatin Rash 07/21/2021    History reviewed. No pertinent family history.  Social History   Socioeconomic History   Marital status: Divorced    Spouse name: Not on file   Number of children: 1   Years of education: Not on file   Highest education level: High school graduate  Occupational History   Occupation: Retired  Tobacco Use   Smoking status: Every Day    Current packs/day: 0.50    Average packs/day: 0.5 packs/day for 60.0 years (30.0 ttl pk-yrs)    Types: Cigarettes   Smokeless tobacco: Never  Vaping Use   Vaping status: Never Used  Substance and Sexual Activity   Alcohol use: No   Drug use: Never   Sexual activity: Not on file  Other Topics Concern   Not on file  Social History Narrative   Not on file   Social Determinants of Health   Financial Resource Strain: Low Risk  (06/26/2022)   Overall Financial Resource Strain (CARDIA)    Difficulty of Paying Living Expenses: Not hard at all  Food Insecurity: No Food Insecurity (08/30/2023)   Hunger Vital Sign    Worried About Running Out of Food in the Last Year: Never true    Ran Out of Food in the Last Year: Never true  Transportation Needs: No Transportation Needs (08/30/2023)   PRAPARE - Administrator, Civil Service (Medical): No    Lack of Transportation (Non-Medical): No  Physical Activity: Not on  file  Stress: Not on file  Social Connections: Unknown (02/12/2022)   [facility]   Social Network    Social Network: Not on file  Intimate Partner Violence: Not At Risk (08/30/2023)   Humiliation, Afraid, Rape, and Kick questionnaire    Fear of Current or Ex-Partner: No    Emotionally Abused: No    Physically Abused: No    Sexually Abused: No    Review of Systems: As per HPI Physical Exam: Vital signs in last 24 hours: Temp:  [97.5 F (36.4 C)-98.6 F (37 C)] 97.5 F (36.4 C) (11/21 0401) Pulse Rate:  [68-86] 68 (11/21 0401) Resp:  [16-20] 20 (11/21 0401) BP: (111-129)/(52-72) 111/52 (11/21 0401) SpO2:  [78 %-98 %] 98 % (11/21 0401) Weight:  [41.6 kg] 41.6 kg (11/21 0447) Last BM Date : 09/01/23  General:   Elderly, frail  Head:  Normocephalic and atraumatic. Eyes:  Sclera clear, no icterus.   Conjunctiva pink. Ears:  Normal auditory acuity. Nose:  No deformity, discharge,  or lesions. Mouth:  No deformity or lesions.  Oropharynx pink & moist. Neck:  Supple; no masses or thyromegaly. Lungs: On oxygen via nasal cannula, able to speak in full sentence,  clear throughout to auscultation.   No wheezes, crackles, or rhonchi. No acute distress. Heart:  Regular rate and rhythm; no murmurs, clicks, rubs,  or gallops. Extremities:  Without clubbing or edema. Neurologic:  Alert and  oriented x4;  grossly normal neurologically. Skin:  Intact without significant lesions or rashes. Psych:  Alert and cooperative. Normal mood and affect. Abdomen:  Soft, nontender and nondistended. No masses, hepatosplenomegaly or hernias noted. Normal bowel sounds, without guarding, and without rebound.         Lab Results: Recent Labs    08/31/23 0934 09/01/23 0511 09/02/23 0531  WBC 18.8* 10.9* 10.2  HGB 13.6 12.2 12.8  HCT 43.7 41.0 42.6  PLT 366 309 337   BMET Recent Labs  08/31/23 0934 09/01/23 0511 09/02/23 0531  NA 141 139 144  K 3.8 3.4* 4.5   CL 95* 94* 98  CO2 35* 38* 37*  GLUCOSE 104* 90 83  BUN 18 26* 23  CREATININE 0.92 0.88 1.13*  CALCIUM 9.0 8.7* 9.3   LFT Recent Labs    09/02/23 0531  PROT 7.2  ALBUMIN 3.0*  AST 14*  ALT 9  ALKPHOS 73  BILITOT 0.6   PT/INR No results for input(s): "LABPROT", "INR" in the last 72 hours.  Studies/Results: DG Chest 2 View  Result Date: 09/01/2023 CLINICAL DATA:  Pneumonia. EXAM: CHEST - 2 VIEW COMPARISON:  08/30/2023 and CT chest 03/07/2022. FINDINGS: Trachea is midline. Heart is at the upper limits of normal in size to mildly enlarged, stable. Thoracic aorta is calcified. Very fine interstitial prominence with probable basilar peripheral septal lines. Tiny bilateral pleural effusions. Osteopenia with scattered compression deformities. IMPRESSION: Suspect mild congestive heart failure. Electronically Signed   By: Leanna Battles M.D.   On: 09/01/2023 15:58   DG ESOPHAGUS W SINGLE CM (SOL OR THIN BA)  Result Date: 09/01/2023 CLINICAL DATA:  Coughing after eating and drinking. EXAM: ESOPHOGRAM/BARIUM SWALLOW TECHNIQUE: Single contrast examination was performed using  thin barium. FLUOROSCOPY: Radiation Exposure Index (as provided by the fluoroscopic device): 2.3 mGy Kerma COMPARISON:  None Available. FINDINGS: Limited examination was performed due to patient condition. Somewhat sluggish esophageal motility with proximal escape. Slight narrowing in the upper esophagus, through which a 13 mm barium tablet would not pass. No esophageal fold thickening. IMPRESSION: 1. Slight narrowing in the upper esophagus, through which a 13 mm barium tablet would not pass. 2. Esophageal dysmotility. Electronically Signed   By: Leanna Battles M.D.   On: 09/01/2023 15:55   ECHOCARDIOGRAM COMPLETE  Result Date: 08/31/2023    ECHOCARDIOGRAM REPORT   Patient Name:   TAFFY HYPPOLITE Date of Exam: 08/31/2023 Medical Rec #:  [id number]    Height:       56.0 in Accession #:    [id number]   Weight:       84.9 lb  Date of Birth:  June 15, 1936     BSA:          1.233 m Patient Age:    87 years     BP:           134/58 mmHg Patient Gender: F            HR:           82 bpm. Exam Location:  Inpatient Procedure: 2D Echo, 3D Echo, Cardiac Doppler, Color Doppler and Strain Analysis Indications:    CHF Acute Diastolic  History:        Patient has prior history of Echocardiogram examinations, most                 recent 03/20/2022. COPD; Risk Factors:Current Smoker and                 Hypertension.  Sonographer:    Karma Ganja Referring Phys: [id number] DAVID MANUEL ORTIZ  Sonographer Comments: Global longitudinal strain was attempted. IMPRESSIONS  1. Left ventricular ejection fraction, by estimation, is 55 to 60%. Left ventricular ejection fraction by 3D volume is 56 %. The left ventricle has normal function. The left ventricle has no regional wall motion abnormalities. There is moderate left ventricular hypertrophy of the basal-septal segment. Left ventricular diastolic parameters are consistent with Grade I diastolic dysfunction (impaired relaxation). Elevated left ventricular end-diastolic pressure. The  average left ventricular global longitudinal strain is -17.7 %. The global longitudinal strain is normal.  2. Right ventricular systolic function is normal. The right ventricular size is normal.  3. Right atrial size was mildly dilated.  4. The mitral valve is normal in structure. Trivial mitral valve regurgitation. No evidence of mitral stenosis.  5. The aortic valve was not well visualized. Aortic valve regurgitation is not visualized. No aortic stenosis is present. Aortic valve area, by VTI measures 2.21 cm. Aortic valve mean gradient measures 5.0 mmHg. Aortic valve Vmax measures 1.45 m/s.  6. The inferior vena cava is normal in size with greater than 50% respiratory variability, suggesting right atrial pressure of 3 mmHg. FINDINGS  Left Ventricle: Left ventricular ejection fraction, by estimation, is 55 to 60%. Left ventricular  ejection fraction by 3D volume is 56 %. The left ventricle has normal function. The left ventricle has no regional wall motion abnormalities. The average left ventricular global longitudinal strain is -17.7 %. The global longitudinal strain is normal. The left ventricular internal cavity size was normal in size. There is moderate left ventricular hypertrophy of the basal-septal segment. Left ventricular diastolic parameters are consistent with Grade I diastolic dysfunction (impaired relaxation). Elevated left ventricular end-diastolic pressure. Right Ventricle: The right ventricular size is normal. No increase in right ventricular wall thickness. Right ventricular systolic function is normal. Left Atrium: Left atrial size was normal in size. Right Atrium: Right atrial size was mildly dilated. Pericardium: There is no evidence of pericardial effusion. Mitral Valve: The mitral valve is normal in structure. Trivial mitral valve regurgitation. No evidence of mitral valve stenosis. Tricuspid Valve: The tricuspid valve is normal in structure. Tricuspid valve regurgitation is not demonstrated. No evidence of tricuspid stenosis. Aortic Valve: The aortic valve was not well visualized. Aortic valve regurgitation is not visualized. No aortic stenosis is present. Aortic valve mean gradient measures 5.0 mmHg. Aortic valve peak gradient measures 8.4 mmHg. Aortic valve area, by VTI measures 2.21 cm. Pulmonic Valve: The pulmonic valve was normal in structure. Pulmonic valve regurgitation is not visualized. No evidence of pulmonic stenosis. Aorta: The aortic root is normal in size and structure. Venous: The inferior vena cava is normal in size with greater than 50% respiratory variability, suggesting right atrial pressure of 3 mmHg. IAS/Shunts: No atrial level shunt detected by color flow Doppler.  LEFT VENTRICLE PLAX 2D LVIDd:         3.70 cm         Diastology LVIDs:         2.80 cm         LV e' medial:    5.11 cm/s LV PW:          0.90 cm         LV E/e' medial:  18.5 LV IVS:        1.40 cm         LV e' lateral:   5.55 cm/s LVOT diam:     1.80 cm         LV E/e' lateral: 17.0 LV SV:         63 LV SV Index:   51              2D LVOT Area:     2.54 cm        Longitudinal  Strain                                2D Strain GLS  -17.8 %                                (A2C):                                2D Strain GLS  -17.4 %                                (A3C):                                2D Strain GLS  -17.9 %                                (A4C):                                2D Strain GLS  -17.7 %                                Avg:                                 3D Volume EF                                LV 3D EF:    Left                                             ventricul                                             ar                                             ejection                                             fraction                                             by 3D  volume is                                             56 %.                                 3D Volume EF:                                3D EF:        56 %                                LV EDV:       93 ml                                LV ESV:       41 ml                                LV SV:        52 ml RIGHT VENTRICLE RV Basal diam:  3.40 cm RV S prime:     10.40 cm/s TAPSE (M-mode): 2.4 cm LEFT ATRIUM             Index        RIGHT ATRIUM           Index LA diam:        3.50 cm 2.84 cm/m   RA Area:     13.80 cm LA Vol (A2C):   44.1 ml 35.76 ml/m  RA Volume:   35.50 ml  28.79 ml/m LA Vol (A4C):   31.6 ml 25.62 ml/m LA Biplane Vol: 40.3 ml 32.68 ml/m  AORTIC VALVE AV Area (Vmax):    1.91 cm AV Area (Vmean):   1.92 cm AV Area (VTI):     2.21 cm AV Vmax:           145.00 cm/s AV Vmean:          97.700 cm/s AV VTI:            0.286 m AV Peak Grad:      8.4 mmHg AV Mean Grad:       5.0 mmHg LVOT Vmax:         109.00 cm/s LVOT Vmean:        73.700 cm/s LVOT VTI:          0.248 m LVOT/AV VTI ratio: 0.87  AORTA Ao Root diam: 2.60 cm MITRAL VALVE MV Area (PHT): 3.83 cm     SHUNTS MV Decel Time: 198 msec     Systemic VTI:  0.25 m MV E velocity: 94.30 cm/s   Systemic Diam: 1.80 cm MV A velocity: 127.00 cm/s MV E/A ratio:  0.74 Armanda Magic MD Electronically signed by Armanda Magic MD Signature Date/Time: 08/31/2023/10:13:53 AM    Final     Impression: Dysphagia, history of esophageal stricture requiring balloon dilation with 12 mm balloon, abnormal barium swallow showed slight narrowing in upper esophagus through which a 13 mm barium tablet would not pass,  esophageal dysmotility.  Lab abnormalities: Creatinine 1.13, GFR 48, albumin 3  Multiple comorbidities: Chronic heart failure, COPD, hypertension, anxiety  Plan: Diagnostic EGD with balloon dilation tomorrow. The risks and the benefits of the procedure were discussed with the patient in details. She understands and verbalizes consent.   LOS: 2 days   Kerin Salen, MD  09/02/2023, 8:21 AM

## 2023-09-02 NOTE — Progress Notes (Addendum)
    Patient Name: Julia Hernandez           DOB: 12-13-1935  MRN: [id number]      Admission Date: 08/30/2023  Attending Provider: Rhetta Mura, MD  Primary Diagnosis: Acute respiratory failure with hypoxia [facility])   Level of care: Med-Surg    CROSS COVER NOTE   Date of Service   09/02/2023   Julia Hernandez, 87 y.o. female, was admitted on 08/30/2023 for Acute respiratory failure with hypoxia (HCC).    HPI/Events of Note   Febrile, max temp 101.1 F Hemodynamically stable on 2 L [place], not meeting SIRS criteria.  Leukocytosis improving, previously had WBC 18.8, now WBC 10.2.  Procalcitonin negative on 11/20.  On admission, patient was started on empiric antibiotics due to chest x-ray showing bilateral infiltrates.  Antibiotics were stopped on 11/19 as URI symptoms suggested viral etiology.  Patient is negative to COVID, flu, RSV, and extended RV panel. Noted to have aspiration risk on 11/20 during barium swallow study.   Bedside Assessment:  Patient is A/O x4, with no associated distress. States she feels better and is wanting to go home soon. She endorses ongoing weakness, cough (ongoing for "couple weeks," chronic smoker), dysuria.   Denies chills, dyspnea, increased work of breathing, abdominal pain, nausea, vomiting, diarrhea. Currently on 2 L [place] with optimal saturation levels.  Respiratory: Mildly wheezy. Normal effort. No accessory muscle use.  Cardiovascular: Regular rate and rhythm. No extremity edema. 2+ pedal pulses.  Abdomen: Abdomen is soft and nontender.  Positive bowel sounds in all quadrants.    Addendum: 2330- no acute cardiopulmonary process noted in chest x-ray.  Lactic acid negative.  Patient is now afebrile.  WBC 11.6. Will continue monitoring patient off antibiotics at this time.    Interventions/ Plan   Workup--> CMP, CBC, lactic acid, chest x-ray, UA, BC         Anthoney Harada, DNP, [employer] [facility] [place]

## 2023-09-02 NOTE — Progress Notes (Signed)
Mobility Specialist - Progress Note   09/02/23 1135  Oxygen Therapy  SpO2 92 %  O2 Device Nasal Cannula  O2 Flow Rate (L/min) 2 L/min  Patient Activity (if Appropriate) Ambulating  Mobility  Activity Ambulated with assistance in hallway  Level of Assistance Standby assist, set-up cues, supervision of patient - no hands on  Assistive Device Front wheel walker  Distance Ambulated (ft) 250 ft  Activity Response Tolerated well  Mobility Referral Yes  $Mobility charge 1 Mobility  Mobility Specialist Start Time (ACUTE ONLY) 1113  Mobility Specialist Stop Time (ACUTE ONLY) 1130  Mobility Specialist Time Calculation (min) (ACUTE ONLY) 17 min   Pt received in bed and agreeable to mobility. No complaints during session. Pt to recliner after session with all needs met.    Pre-mobility: 95 HR, 91% SpO2 During mobility: 96 HR, 92% SpO2 Post-mobility: 94 HR, 92% SPO2  Chief Technology Officer

## 2023-09-02 NOTE — Plan of Care (Signed)
  Problem: Education: Goal: Knowledge of General Education information will improve Description: Including pain rating scale, medication(s)/side effects and non-pharmacologic comfort measures Outcome: Progressing   Problem: Health Behavior/Discharge Planning: Goal: Ability to manage health-related needs will improve Outcome: Progressing   Problem: Clinical Measurements: Goal: Ability to maintain clinical measurements within normal limits will improve Outcome: Progressing Goal: Will remain free from infection Outcome: Progressing Goal: Diagnostic test results will improve Outcome: Progressing Goal: Respiratory complications will improve Outcome: Progressing Goal: Cardiovascular complication will be avoided Outcome: Progressing   Problem: Activity: Goal: Risk for activity intolerance will decrease Outcome: Progressing   Problem: Nutrition: Goal: Adequate nutrition will be maintained Outcome: Progressing   Problem: Coping: Goal: Level of anxiety will decrease Outcome: Progressing   Problem: Elimination: Goal: Will not experience complications related to bowel motility Outcome: Progressing Goal: Will not experience complications related to urinary retention Outcome: Progressing   Problem: Pain Management: Goal: General experience of comfort will improve Outcome: Progressing   Problem: Safety: Goal: Ability to remain free from injury will improve Outcome: Progressing   Problem: Skin Integrity: Goal: Risk for impaired skin integrity will decrease Outcome: Progressing   Problem: Education: Goal: Ability to demonstrate management of disease process will improve Outcome: Progressing Goal: Ability to verbalize understanding of medication therapies will improve Outcome: Progressing Goal: Individualized Educational Video(s) Outcome: Progressing   Problem: Activity: Goal: Capacity to carry out activities will improve Outcome: Progressing   Problem: Cardiac: Goal:  Ability to achieve and maintain adequate cardiopulmonary perfusion will improve Outcome: Progressing   Problem: Activity: Goal: Ability to tolerate increased activity will improve Outcome: Progressing   Problem: Clinical Measurements: Goal: Ability to maintain a body temperature in the normal range will improve Outcome: Progressing   Problem: Respiratory: Goal: Ability to maintain adequate ventilation will improve Outcome: Progressing Goal: Ability to maintain a clear airway will improve Outcome: Progressing

## 2023-09-03 ENCOUNTER — Inpatient Hospital Stay (HOSPITAL_COMMUNITY): Payer: Medicare HMO | Admitting: Anesthesiology

## 2023-09-03 ENCOUNTER — Encounter (HOSPITAL_COMMUNITY): Payer: Self-pay | Admitting: Family Medicine

## 2023-09-03 ENCOUNTER — Encounter (HOSPITAL_COMMUNITY)
Admission: EM | Disposition: A | Discharge status: Home / Self Care | Payer: Self-pay | Source: Home / Self Care | Attending: Family Medicine

## 2023-09-03 DIAGNOSIS — J9601 Acute respiratory failure with hypoxia: Secondary | ICD-10-CM | POA: Diagnosis not present

## 2023-09-03 DIAGNOSIS — K222 Esophageal obstruction: Secondary | ICD-10-CM | POA: Diagnosis not present

## 2023-09-03 HISTORY — PX: BALLOON DILATION: SHX5330

## 2023-09-03 HISTORY — PX: ESOPHAGOGASTRODUODENOSCOPY (EGD) WITH PROPOFOL: SHX5813

## 2023-09-03 HISTORY — PX: BOTOX INJECTION: SHX5754

## 2023-09-03 LAB — CBC
HCT: 43.4 % (ref 36.0–46.0)
Hemoglobin: 12.8 g/dL (ref 12.0–15.0)
MCH: 28.9 pg (ref 26.0–34.0)
MCHC: 29.5 g/dL — ABNORMAL LOW (ref 30.0–36.0)
MCV: 98 fL (ref 80.0–100.0)
Platelets: 334 10*3/uL (ref 150–400)
RBC: 4.43 MIL/uL (ref 3.87–5.11)
RDW: 13.7 % (ref 11.5–15.5)
WBC: 9.6 10*3/uL (ref 4.0–10.5)
nRBC: 0 % (ref 0.0–0.2)

## 2023-09-03 LAB — URINALYSIS, COMPLETE (UACMP) WITH MICROSCOPIC
Bacteria, UA: NONE SEEN
Bilirubin Urine: NEGATIVE
Glucose, UA: NEGATIVE mg/dL
Hgb urine dipstick: NEGATIVE
Ketones, ur: NEGATIVE mg/dL
Leukocytes,Ua: NEGATIVE
Nitrite: NEGATIVE
Protein, ur: NEGATIVE mg/dL
Specific Gravity, Urine: 1.01 (ref 1.005–1.030)
pH: 7 (ref 5.0–8.0)

## 2023-09-03 LAB — COMPREHENSIVE METABOLIC PANEL
ALT: 10 U/L (ref 0–44)
AST: 14 U/L — ABNORMAL LOW (ref 15–41)
Albumin: 2.7 g/dL — ABNORMAL LOW (ref 3.5–5.0)
Alkaline Phosphatase: 58 U/L (ref 38–126)
Anion gap: 7 (ref 5–15)
BUN: 18 mg/dL (ref 8–23)
CO2: 29 mmol/L (ref 22–32)
Calcium: 7.6 mg/dL — ABNORMAL LOW (ref 8.9–10.3)
Chloride: 105 mmol/L (ref 98–111)
Creatinine, Ser: 0.92 mg/dL (ref 0.44–1.00)
GFR, Estimated: >60 mL/min (ref >60)
Glucose, Bld: 80 mg/dL (ref 70–99)
Potassium: 3.5 mmol/L (ref 3.5–5.1)
Sodium: 141 mmol/L (ref 135–145)
Total Bilirubin: 0.4 mg/dL (ref <1.2)
Total Protein: 6 g/dL — ABNORMAL LOW (ref 6.5–8.1)

## 2023-09-03 LAB — LACTIC ACID, PLASMA: Lactic Acid, Venous: 1.3 mmol/L (ref 0.5–1.9)

## 2023-09-03 SURGERY — ESOPHAGOGASTRODUODENOSCOPY (EGD) WITH PROPOFOL
Anesthesia: Monitor Anesthesia Care

## 2023-09-03 MED ORDER — PROPOFOL 500 MG/50ML IV EMUL
INTRAVENOUS | Status: AC
  Filled 2023-09-03: qty 50

## 2023-09-03 MED ORDER — SODIUM CHLORIDE (PF) 0.9 % IJ SOLN
INTRAMUSCULAR | Status: AC
  Filled 2023-09-03: qty 10

## 2023-09-03 MED ORDER — ONABOTULINUMTOXINA 100 UNITS IJ SOLR
INTRAMUSCULAR | Status: AC
Start: 2023-09-03 — End: ?
  Filled 2023-09-03: qty 100

## 2023-09-03 MED ORDER — PROPOFOL 500 MG/50ML IV EMUL
INTRAVENOUS | Status: DC | PRN
  Administered 2023-09-03: 120 ug/kg/min via INTRAVENOUS

## 2023-09-03 MED ORDER — PHENYLEPHRINE 80 MCG/ML (10ML) SYRINGE FOR IV PUSH (FOR BLOOD PRESSURE SUPPORT)
PREFILLED_SYRINGE | INTRAVENOUS | Status: DC | PRN
  Administered 2023-09-03 (×2): 80 ug via INTRAVENOUS

## 2023-09-03 MED ORDER — SODIUM CHLORIDE (PF) 0.9 % IJ SOLN
INTRAMUSCULAR | Status: DC | PRN
  Administered 2023-09-03: 4 mL via SUBMUCOSAL

## 2023-09-03 MED ORDER — PROPOFOL 10 MG/ML IV BOLUS
INTRAVENOUS | Status: DC | PRN
  Administered 2023-09-03: 20 mg via INTRAVENOUS
  Administered 2023-09-03: 10 mg via INTRAVENOUS

## 2023-09-03 SURGICAL SUPPLY — 14 items

## 2023-09-03 NOTE — Progress Notes (Signed)
Heart Failure Nurse Navigator Progress Note  PCP: Sigmund Hazel, MD PCP-Cardiologist: Duke Salvia Admission Diagnosis: Acute hypoxic respiratory failure, Pneumonia, Acute on chronic diastolic heart failure.  Admitted from: home via EMS  Presentation:   Julia Hernandez presented with cough, fever, body aches, shortness of breath ,has lasix that she can take, however could not find it to take, BNP 429, BP 159/72, HR 68, O2 levels in the 80's, CXR showed diffuse opacities, consider interstitial lung disease, atypical infection, pulmonary edema.   Patient was seen last in HF TOC on 07/05/23, Navigator called and spoke with patient at [facility], Patient educated on the sign and symptoms of heart failure, daily weights, when to call her doctor or go to the ED, Diet/ fluid restrictions, taking all medications as prescribed and attending all medical appointments. Patient verbalized her understanding, a HF TOC follow up appointment was scheduled for 09/24/2023 @ 1:30 pm.   ECHO/ LVEF: 55-60%  Clinical Course:  Past Medical History:  Diagnosis Date   Asthma    COPD (chronic obstructive pulmonary disease) (HCC)    Hypertension      Social History   Socioeconomic History   Marital status: Divorced    Spouse name: Not on file   Number of children: 1   Years of education: Not on file   Highest education level: High school graduate  Occupational History   Occupation: Retired  Tobacco Use   Smoking status: Every Day    Current packs/day: 0.50    Average packs/day: 0.5 packs/day for 60.0 years (30.0 ttl pk-yrs)    Types: Cigarettes   Smokeless tobacco: Never  Vaping Use   Vaping status: Never Used  Substance and Sexual Activity   Alcohol use: No   Drug use: Never   Sexual activity: Not on file  Other Topics Concern   Not on file  Social History Narrative   Not on file   Social Determinants of Health   Financial Resource Strain: Low Risk  (09/03/2023)   Overall Financial Resource Strain  (CARDIA)    Difficulty of Paying Living Expenses: Not very hard  Food Insecurity: No Food Insecurity (08/30/2023)   Hunger Vital Sign    Worried About Running Out of Food in the Last Year: Never true    Ran Out of Food in the Last Year: Never true  Transportation Needs: No Transportation Needs (08/30/2023)   PRAPARE - Administrator, Civil Service (Medical): No    Lack of Transportation (Non-Medical): No  Physical Activity: Not on file  Stress: Not on file  Social Connections: Unknown (02/12/2022)   Received from [facility]   Social Network    Social Network: Not on file   Education Assessment and Provision:  Detailed education and instructions provided on heart failure disease management including the following:  Signs and symptoms of Heart Failure When to call the physician Importance of daily weights Low sodium diet Fluid restriction Medication management Anticipated future follow-up appointments  Patient education given on each of the above topics.  Patient acknowledges understanding via teach back method and acceptance of all instructions.  Education Materials:  "Living Better With Heart Failure" Booklet, HF zone tool, & Daily Weight Tracker Tool.  Patient has scale at home: yes Patient has pill box at home: yes    High Risk Criteria for Readmission and/or Poor Patient Outcomes: Heart failure hospital admissions (last 6 months): 0  No Show rate: 0 Difficult social situation: No Demonstrates medication adherence: Yes Primary  Language: English Literacy level: reading, writing and comprehension  Barriers of Care:   Reminders of Diet/ fluid restrictions Daily weights  Considerations/Referrals:   Referral made to Heart Failure Pharmacist Stewardship: No Referral made to Heart Failure CSW/NCM TOC: No  Referral made to Heart & Vascular TOC clinic: Yes , a HF follow up, on 09/24/2023 @ 1 : 30 pm, last seen in HF TOC 06/2023  Items for  Follow-up on DC/TOC: Continued HF education Diet/ fluid restriction reminders   Rhae Hammock, BSN, RN Heart Failure Print production planner Chat Only

## 2023-09-03 NOTE — Interval H&P Note (Signed)
History and Physical Interval Note: 87/female with dysphagia, abnormal barium swallow for EGD with possible balloon dilation and possible botox injection with propofol.  09/03/2023 9:21 AM  Julia Hernandez  has presented today for EGD with possible balloon dilation and possible botox injection with propofol, with the diagnosis of abnormal barium swallow, history of esophageal stricture.  The various methods of treatment have been discussed with the patient and family. After consideration of risks, benefits and other options for treatment, the patient has consented to  Procedure(s): ESOPHAGOGASTRODUODENOSCOPY (EGD) WITH PROPOFOL (N/A) as a surgical intervention.  The patient's history has been reviewed, patient examined, no change in status, stable for surgery.  I have reviewed the patient's chart and labs.  Questions were answered to the patient's satisfaction.     Kerin Salen

## 2023-09-03 NOTE — Transfer of Care (Signed)
Immediate Anesthesia Transfer of Care Note  Patient: Julia Hernandez  Procedure(s) Performed: ESOPHAGOGASTRODUODENOSCOPY (EGD) WITH PROPOFOL SCLEROTHERAPY BALLOON DILATION  Patient Location: Endoscopy Unit  Anesthesia Type:MAC  Level of Consciousness: awake and patient cooperative  Airway & Oxygen Therapy: Patient Spontanous Breathing and Patient connected to face mask  Post-op Assessment: Report given to RN and Post -op Vital signs reviewed and stable  Post vital signs: Reviewed and stable  Last Vitals:  Vitals Value Taken Time  BP 128/43 09/03/23 0958  Temp    Pulse 78 09/03/23 1000  Resp 27 09/03/23 1000  SpO2 100 % 09/03/23 1000  Vitals shown include unfiled device data.  Last Pain:  Vitals:   09/03/23 0956  TempSrc:   PainSc: Asleep         Complications: No notable events documented.

## 2023-09-03 NOTE — Plan of Care (Signed)

## 2023-09-03 NOTE — Anesthesia Postprocedure Evaluation (Signed)
Anesthesia Post Note  Patient: Julia Hernandez  Procedure(s) Performed: ESOPHAGOGASTRODUODENOSCOPY (EGD) WITH PROPOFOL SCLEROTHERAPY BALLOON DILATION     Patient location during evaluation: PACU Anesthesia Type: MAC Level of consciousness: awake and alert Pain management: pain level controlled Vital Signs Assessment: post-procedure vital signs reviewed and stable Respiratory status: spontaneous breathing, nonlabored ventilation, respiratory function stable and patient connected to nasal cannula oxygen Cardiovascular status: stable and blood pressure returned to baseline Anesthetic complications: no   No notable events documented.  Last Vitals:  Vitals:   09/03/23 1020 09/03/23 1030  BP: (!) 116/46 (!) 121/46  Pulse: 75 72  Resp: (!) 24 (!) 23  Temp:    SpO2: 98% 99%    Last Pain:  Vitals:   09/03/23 1030  TempSrc:   PainSc: 0-No pain                 Beryle Lathe

## 2023-09-03 NOTE — Progress Notes (Signed)
Physical Therapy Treatment Patient Details Name: Julia Hernandez MRN: [id number] DOB: Feb 04, 1936 Today's Date: 09/03/2023   History of Present Illness 87 y.o. F  who presented with several days increasing dyspnea and dry cough.  In the ER, CXR showed bilateral infiltrates. Dx of acute respiratory failure. s/p EGD 09/03/23.  PMH: dCHF, COPD not on home O2, CKD IIIa, CAD, and IDA    PT Comments  Noted pt had EDG this morning. Pt reported feeling "a little weak" following procedure. She ambulated 120' with single hand held assist and 2L O2, no loss of balance, 2/4 dyspnea.     If plan is discharge home, recommend the following: A little help with walking and/or transfers;A little help with bathing/dressing/bathroom;Assistance with cooking/housework;Help with stairs or ramp for entrance   Can travel by private vehicle        Equipment Recommendations  Other (comment) (oxygen)    Recommendations for Other Services       Precautions / Restrictions Precautions Precautions: Fall Precaution Comments: denies falls in past 6 months, monitor O2 Restrictions Weight Bearing Restrictions: No     Mobility  Bed Mobility               General bed mobility comments: up in recliner    Transfers Overall transfer level: Needs assistance Equipment used: None Transfers: Sit to/from Stand Sit to Stand: Supervision           General transfer comment: supervision for safety    Ambulation/Gait Ambulation/Gait assistance: Min assist Gait Distance (Feet): 120 Feet Assistive device: 1 person hand held assist Gait Pattern/deviations: Decreased stride length, Step-through pattern Gait velocity: WFL     General Gait Details: no loss of balance however pt felt unsteady without HHA so provided single HHA, pt ambulated with 2L O2, 2/4 dyspnea   Stairs             Wheelchair Mobility     Tilt Bed    Modified Rankin (Stroke Patients Only)       Balance Overall balance  assessment: Mild deficits observed, not formally tested                                          Cognition Arousal: Alert Behavior During Therapy: WFL for tasks assessed/performed Overall Cognitive Status: Within Functional Limits for tasks assessed                                          Exercises      General Comments        Pertinent Vitals/Pain Pain Assessment Pain Assessment: No/denies pain Faces Pain Scale: No hurt    Home Living                          Prior Function            PT Goals (current goals can now be found in the care plan section) Acute Rehab PT Goals Patient Stated Goal: likes to dance PT Goal Formulation: With patient Time For Goal Achievement: 09/15/23 Potential to Achieve Goals: Good Progress towards PT goals: Progressing toward goals    Frequency    Min 1X/week      PT Plan      Co-evaluation  AM-PAC PT "6 Clicks" Mobility   Outcome Measure  Help needed turning from your back to your side while in a flat bed without using bedrails?: None Help needed moving from lying on your back to sitting on the side of a flat bed without using bedrails?: None Help needed moving to and from a bed to a chair (including a wheelchair)?: None Help needed standing up from a chair using your arms (e.g., wheelchair or bedside chair)?: None Help needed to walk in hospital room?: A Little Help needed climbing 3-5 steps with a railing? : None 6 Click Score: 23    End of Session Equipment Utilized During Treatment: Gait belt;Oxygen Activity Tolerance: Patient tolerated treatment well Patient left: in chair;with call bell/phone within reach;with chair alarm set Nurse Communication: Mobility status PT Visit Diagnosis: Difficulty in walking, not elsewhere classified (R26.2)     Time: 8119-1478 PT Time Calculation (min) (ACUTE ONLY): 17 min  Charges:    $Gait Training: 8-22 mins PT  General Charges $$ ACUTE PT VISIT: 1 Visit                     Tamala Ser PT 09/03/2023  Acute Rehabilitation Services  Office [phone]

## 2023-09-03 NOTE — Anesthesia Preprocedure Evaluation (Addendum)
Anesthesia Evaluation  Patient identified by MRN, date of birth, ID band Patient awake    Reviewed: Allergy & Precautions, NPO status , Patient's Chart, lab work & pertinent test results, reviewed documented beta blocker date and time   History of Anesthesia Complications Negative for: history of anesthetic complications  Airway Mallampati: II  TM Distance: >3 FB Neck ROM: Full    Dental  (+) Edentulous Lower, Edentulous Upper   Pulmonary asthma , COPD,  COPD inhaler, Current Smoker and Patient abstained from smoking.    + decreased breath sounds+ wheezing      Cardiovascular hypertension, Pt. on medications and Pt. on home beta blockers + CAD  Normal cardiovascular exam   '24 TTE - EF 55 to 60%. There is moderate left ventricular hypertrophy of the basal-septal segment. Grade I diastolic dysfunction (impaired relaxation). Right atrial size was mildly dilated. Trivial mitral valve regurgitation.     Neuro/Psych  PSYCHIATRIC DISORDERS Anxiety Depression    negative neurological ROS     GI/Hepatic Neg liver ROS,,, Esophageal stricture    Endo/Other  negative endocrine ROS    Renal/GU CRFRenal disease     Musculoskeletal negative musculoskeletal ROS (+)    Abdominal   Peds  Hematology negative hematology ROS (+)   Anesthesia Other Findings   Reproductive/Obstetrics                             Anesthesia Physical Anesthesia Plan  ASA: 3  Anesthesia Plan: MAC   Post-op Pain Management: Minimal or no pain anticipated   Induction:   PONV Risk Score and Plan: 2 and Propofol infusion and Treatment may vary due to age or medical condition  Airway Management Planned: Natural Airway and Simple Face Mask  Additional Equipment: None  Intra-op Plan:   Post-operative Plan:   Informed Consent: I have reviewed the patients History and Physical, chart, labs and discussed the procedure  including the risks, benefits and alternatives for the proposed anesthesia with the patient or authorized representative who has indicated his/her understanding and acceptance.   Patient has DNR.  Discussed DNR with patient and Suspend DNR.     Plan Discussed with: CRNA and Anesthesiologist  Anesthesia Plan Comments:         Anesthesia Quick Evaluation

## 2023-09-03 NOTE — Progress Notes (Signed)
HOSPITALIST ROUNDING NOTE Julia Hernandez WUJ:[id number]  DOB: Feb 23, 1936  DOA: 08/30/2023  PCP: Sigmund Hazel, MD  09/03/2023,2:45 PM   LOS: 3 days      Code Status: DNR  From: Home  current Dispo: Rehab     87 year old white female HFpEF COPD not on home oxygen at baseline-continues to use tobacco CKD 3 AA CAD + angioplasty graft 2020 [facility]-previously on Brilinta/ASA Previous admission 03/2022 for intermittent black stools-EGD showed no bleeding however esophageal stenosis was noted and this was dilated by [facility] gastroenterology and she was sent home on dysphagia 3 diet   Last hospitalized 9/13 through 06/26/2022 with respiratory failure secondary to diastolic CHF Was recently changed from Lasix to Aldactone by PCP?  Chart review 07/07/2022 shows they added 12.5 Aldactone and change Lasix to as needed for 3 pound weight gain or lower extremity edema 11/20 barium swallow shows that barium tablet got stuck--CXR repeat 11/20 showed some fluid in the lungs GI consulted subsequently 11/21-EGD--Dilatation  to 15 mm and botox injection for spasm and tortuous esophagus  Plan  Acute respiratory failure on admission with hypoxia--DDx pneumonitis versus subclinical aspiration Flu COVID RSV respiratory viral panel negative BNP slightly up seems stable procalcitonin negative Can continue Magic mouthwash at this time with lidocaine ?pneumonitis from aspiration Not overly concerned about 1 time fever overnight 11/21--monitor today for recurrence--if no further fever can d/c home am  Esophageal stricture? Was dilated previously 03/31/2022-barium single contrast shows sticking of tablet so Dr. Pati Gallo of GI consulted endoscopy as above 11/22--graduate diet, no further scopes as OP unless deemed necessary by GI  Chronic HFpEF BNP at baseline Held all diuretics previously including Lasix Aldactone 2/2 rising bicarb--lasix 40 resumed  continues Diamox 500 Continues on lisinopril 2.5  daily  Underlying COPD continued smoker Trace wheeze, continue Dulera 2 puff twice daily, Mucinex 600 twice daily  HTN Continue Toprol-XL 12.5 at bedtime Monitor trends  Normocytic anemia Continue iron sulfate 325  Leukocytosis Nonspecific --?  Resolving  Anxiety Continue Valium 2.5 daily as needed, sertraline 100 at bedtime   moderate protein calorie malnutrition  Stable stage IIIa CKD Watch labs periodically with resumption of diuretics  DVT prophylaxis:   Lovenox  Status is: Inpatient Remains inpatient appropriate because:    Requires barium swallow  Subjective:  Back from procedure-note fever overnight  no cp chills rigor, no n/v overall she appears well  Objective + exam Vitals:   09/03/23 1020 09/03/23 1030 09/03/23 1105 09/03/23 1107  BP: (!) 116/46 (!) 121/46 112/68 112/68  Pulse: 75 72 70   Resp: (!) 24 (!) 23 16   Temp:   98 F (36.7 C)   TempSrc:   Oral   SpO2: 98% 99% 100%   Weight:      Height:       Filed Weights   09/02/23 0447 09/03/23 0500 09/03/23 0816  Weight: 41.6 kg 37 kg 37 kg    Examination:  EOMI NCAT no focal deficit no icterus no pallor Mild wheeze posteriorly overall improved S1-S2 no murmur  Abdomen soft no rebound no guarding ROM intact Power 5/5 intact  Data Reviewed: reviewed   CBC    Component Value Date/Time   WBC 9.6 09/03/2023 0530   RBC 4.43 09/03/2023 0530   HGB 12.8 09/03/2023 0530   HCT 43.4 09/03/2023 0530   PLT 334 09/03/2023 0530   MCV 98.0 09/03/2023 0530   MCH 28.9 09/03/2023 0530   MCHC 29.5 (L) 09/03/2023 0530   RDW 13.7  09/03/2023 0530   LYMPHSABS 1.7 09/02/2023 2310   MONOABS 0.6 09/02/2023 2310   EOSABS 0.1 09/02/2023 2310   BASOSABS 0.0 09/02/2023 2310      Latest Ref Rng & Units 09/03/2023    5:30 AM 09/02/2023   11:10 PM 09/02/2023    5:31 AM  CMP  Glucose 70 - 99 mg/dL 80  725  83   BUN 8 - 23 mg/dL 18  21  23    Creatinine 0.44 - 1.00 mg/dL 3.66  4.40  3.47   Sodium 135 -  145 mmol/L 141  139  144   Potassium 3.5 - 5.1 mmol/L 3.5  3.8  4.5   Chloride 98 - 111 mmol/L 105  97  98   CO2 22 - 32 mmol/L 29  32  37   Calcium 8.9 - 10.3 mg/dL 7.6  9.2  9.3   Total Protein 6.5 - 8.1 g/dL 6.0  7.3  7.2   Total Bilirubin <1.2 mg/dL 0.4  0.7  0.6   Alkaline Phos 38 - 126 U/L 58  70  73   AST 15 - 41 U/L 14  15  14    ALT 0 - 44 U/L 10  10  9       Scheduled Meds:  acetaZOLAMIDE  500 mg Oral Daily   aspirin EC  81 mg Oral Daily   enoxaparin (LOVENOX) injection  30 mg Subcutaneous Q24H   feeding supplement  237 mL Oral BID BM   ferrous sulfate  325 mg Oral Q breakfast   furosemide  20 mg Oral Daily   guaiFENesin  600 mg Oral BID   lisinopril  2.5 mg Oral Daily   metoprolol succinate  12.5 mg Oral Daily   mometasone-formoterol  2 puff Inhalation BID   sertraline  100 mg Oral QHS   Continuous Infusions:    Time  26  Rhetta Mura, MD  [facility]

## 2023-09-03 NOTE — Op Note (Signed)
[facility] Patient Name: Julia Hernandez Procedure Date: 09/03/2023 MRN: [id number] Attending MD: Kerin Salen , MD, [id number] Date of Birth: Sep 18, 1936 CSN: [id number] Age: 87 Admit Type: Outpatient Procedure:                Upper GI endoscopy Indications:              Dysphagia, Abnormal cine-esophagram Providers:                Kerin Salen, MD, Suzy Bouchard, RN, Geoffery Lyons,                            Technician Referring MD:             [facility] Medicines:                Monitored Anesthesia Care Complications:            No immediate complications. Estimated Blood Loss:     Estimated blood loss was minimal. Procedure:                Pre-Anesthesia Assessment:                           - Prior to the procedure, a History and Physical                            was performed, and patient medications and                            allergies were reviewed. The patient's tolerance of                            previous anesthesia was also reviewed. The risks                            and benefits of the procedure and the sedation                            options and risks were discussed with the patient.                            All questions were answered, and informed consent                            was obtained. Prior Anticoagulants: The patient has                            taken no anticoagulant or antiplatelet agents                            except for aspirin. ASA Grade Assessment: III - A                            patient with severe systemic disease. After  reviewing the risks and benefits, the patient was                            deemed in satisfactory condition to undergo the                            procedure.                           After obtaining informed consent, the endoscope was                            passed under direct vision. Throughout the                            procedure, the patient's  blood pressure, pulse, and                            oxygen saturations were monitored continuously. The                            [id number]-[id number] ([id number]) Olympus endoscope was introduced                            through the mouth, and advanced to the second part                            of duodenum. The upper GI endoscopy was                            accomplished without difficulty. The patient                            tolerated the procedure well. Scope In: Scope Out: Findings:      One benign-appearing, intrinsic mild stenosis was found in the lower       esophagus. The stenosis was traversed. A TTS dilator was passed through       the scope. Dilation with a 15-16.5-18 mm balloon dilator was performed       to 15 mm. The dilation site was examined following endoscope reinsertion       and showed complete resolution of luminal narrowing. Area was       successfully injected with 100 units botulinum toxin.      The examined esophagus was mildly tortuous.      The entire examined stomach was normal.      The cardia and gastric fundus were normal on retroflexion.      The examined duodenum was normal. Impression:               - Benign-appearing esophageal stenosis. Dilated.                            Injected with botulinum toxin.                           - Tortuous esophagus.                           -  Normal stomach.                           - Normal examined duodenum.                           - No specimens collected. Moderate Sedation:      Patient did not receive moderate sedation for this procedure, but       instead received monitored anesthesia care. Recommendation:           - Mechanical soft diet.                           - Continue present medications. Procedure Code(s):        --- Professional ---                           [phone], Esophagogastroduodenoscopy, flexible,                            transoral; with transendoscopic balloon dilation of                             esophagus (less than 30 mm diameter)                           43236, 59, Esophagogastroduodenoscopy, flexible,                            transoral; with directed submucosal injection(s),                            any substance Diagnosis Code(s):        --- Professional ---                           K22.2, Esophageal obstruction                           Q39.9, Congenital malformation of esophagus,                            unspecified                           R13.10, Dysphagia, unspecified                           R93.3, Abnormal findings on diagnostic imaging of                            other parts of digestive tract CPT copyright 2022 [employer]. All rights reserved. The codes documented in this report are preliminary and upon coder review may  be revised to meet current compliance requirements. Kerin Salen, MD 09/03/2023 9:57:57 AM This report has been signed electronically. Number of Addenda: 0

## 2023-09-03 NOTE — Anesthesia Procedure Notes (Signed)
Procedure Name: MAC Date/Time: 09/03/2023 9:30 AM  Performed by: Vanessa [place], CRNAPre-anesthesia Checklist: Patient identified, Emergency Drugs available, Suction available and Patient being monitored Patient Re-evaluated:Patient Re-evaluated prior to induction Oxygen Delivery Method: Simple face mask

## 2023-09-04 DIAGNOSIS — J9601 Acute respiratory failure with hypoxia: Secondary | ICD-10-CM | POA: Diagnosis not present

## 2023-09-04 LAB — COMPREHENSIVE METABOLIC PANEL
ALT: 11 U/L (ref 0–44)
AST: 16 U/L (ref 15–41)
Albumin: 3 g/dL — ABNORMAL LOW (ref 3.5–5.0)
Alkaline Phosphatase: 59 U/L (ref 38–126)
Anion gap: 8 (ref 5–15)
BUN: 23 mg/dL (ref 8–23)
CO2: 29 mmol/L (ref 22–32)
Calcium: 8.9 mg/dL (ref 8.9–10.3)
Chloride: 100 mmol/L (ref 98–111)
Creatinine, Ser: 1.21 mg/dL — ABNORMAL HIGH (ref 0.44–1.00)
GFR, Estimated: 44 mL/min — ABNORMAL LOW (ref >60)
Glucose, Bld: 87 mg/dL (ref 70–99)
Potassium: 3.9 mmol/L (ref 3.5–5.1)
Sodium: 137 mmol/L (ref 135–145)
Total Bilirubin: 0.5 mg/dL (ref <1.2)
Total Protein: 6.8 g/dL (ref 6.5–8.1)

## 2023-09-04 MED ORDER — GUAIFENESIN ER 600 MG PO TB12: 600.0000 mg | ORAL_TABLET | Freq: Two times a day (BID) | ORAL | 0 refills | Status: DC

## 2023-09-04 MED ORDER — FERROUS SULFATE 325 (65 FE) MG PO TABS: 325.0000 mg | ORAL_TABLET | Freq: Every day | ORAL | Status: DC

## 2023-09-04 MED ORDER — LISINOPRIL 2.5 MG PO TABS: 2.5000 mg | ORAL_TABLET | Freq: Every day | ORAL | 0 refills | Status: DC

## 2023-09-04 NOTE — Discharge Summary (Addendum)
Physician Discharge Summary  Julia Hernandez ZOX:[id number] DOB: 12-14-1935 DOA: 08/30/2023  PCP: Sigmund Hazel, MD  Admit date: 08/30/2023 Discharge date: 09/04/2023  Time spent: 46 minutes  Recommendations for Outpatient Follow-up:  Recommend outpatient Chem-12 CBC and discussion about Diamox 500 mg if becomes alkalotic again with a CO2 level of 35 or 36 Will require education about heart failure-continue Aldactone scheduled would only use Lasix as as needed-she needs education about this as I am not sure if she was taking them in the outpatient setting Does not require oxygen-treated mainly for pneumonitis and her goal oxygen sat is above 88-92 not above this  Discharge Diagnoses:  MAIN problem for hospitalization   Multifactorial SOB--pneumonitis + acute exacerbation of HF superimposed on COPD Oesophageal stricture  Please see below for itemized issues addressed in HOpsital- refer to other progress notes for clarity if needed  Discharge Condition: improved some  Diet recommendation: dysphagia  Filed Weights   09/03/23 0500 09/03/23 0816 09/04/23 0500  Weight: 37 kg 37 kg 37.8 kg    History of present illness:  87 year old white female HFpEF COPD not on home oxygen at baseline-continues to use tobacco CKD 3 AA CAD + angioplasty graft 2020 [facility]-previously on Brilinta/ASA Previous admission 03/2022 for intermittent black stools-EGD showed no bleeding however esophageal stenosis was noted and this was dilated by [facility] gastroenterology and she was sent home on dysphagia 3 diet     Last hospitalized 9/13 through 06/26/2022 with respiratory failure secondary to diastolic CHF Was recently changed from Lasix to Aldactone by PCP?  Chart review 07/07/2022 shows they added 12.5 Aldactone and change Lasix to as needed for 3 pound weight gain or lower extremity edema 11/20 barium swallow shows that barium tablet got stuck--CXR repeat 11/20 showed some fluid in the lungs GI  consulted subsequently 11/21-EGD--Dilatation  to 15 mm and botox injection for spasm and tortuous esophagus    Hospital Course:  Acute respiratory failure on admission with hypoxia--DDx pneumonitis versus subclinical aspiration Flu COVID RSV respiratory viral panel negative BNP about baseline stable procalcitonin negative Can continue Magic mouthwash at this time with lidocaine ?pneumonitis from aspiration--- had a low-grade fever 1 time overnight and is able to eat now without fever chills nausea vomiting X-rays were done during hospital stay that did not really show any confirmation of pneumonia Patient was stabilized   Esophageal stricture as well as tertiary contractions and tortuous esophagus based on scope performed on 11/22 Was dilated previously 03/31/2022- barium single contrast shows sticking of tablet so Dr. Pati Gallo of GI consulted endoscopy as above 11/22--graduate diet, no further scopes as OP unless deemed necessary by GI--- should go home may be on a mechanical soft diet to start and then review as an outpatient If she has recurrent issues patient will need to follow with [facility] gastroenterology who saw her previously and it would be beneficial for her to follow-up in the outpatient setting   Chronic HFpEF BNP at baseline Patient was initially diuresed with Lasix however because of some alkalosis and an increase in HCO3 to about 38, diuresis was temporarily halted She tells me that she was not taking any of her diuretics at home-I have reinforced that she needs to take at least Aldactone daily with as needed Lasix as per instructions She will also benefit from education about taking these meds   Underlying COPD continued smoker Trace wheeze, continue Dulera 2 puff twice daily, Mucinex 600 twice daily It is unlikely she will quit smoking-I have encouraged her  to stop   HTN Continue Toprol-XL 12.5 at bedtime--- lisinopril addition was entertained but was held secondary to risk of  hyperkalemia and may benefit from repeat labs and discussion of the same as an outpatient Monitor trends   Normocytic anemia Continue iron sulfate 325   Leukocytosis Nonspecific --likely related to pneumonitis   Anxiety Continue Valium 2.5 daily as needed, sertraline 100 at bedtime    moderate protein calorie malnutrition BMI 18 and worsened because of inability to eat but now this is improved with   Stable stage IIIa CKD Watch labs periodically and get labs in about 1 week as we are going to be resuming her diuretics   Discharge Exam: Vitals:   09/04/23 0855 09/04/23 1000  BP:  114/62  Pulse:  71  Resp:    Temp:    SpO2: 92%     Subj on day of d/c   Coherent awake alert no distress  General Exam on discharge  EOMI NCAT no focal deficit no icterus no pallor Chest clear no wheeze rales rhonchi Off oxygen Abdomen soft no rebound Trace lower extremity edema No JVD Neuro intact  Discharge Instructions   Discharge Instructions     (HEART FAILURE PATIENTS) Call MD:  Anytime you have any of the following symptoms: 1) 3 pound weight gain in 24 hours or 5 pounds in 1 week 2) shortness of breath, with or without a dry hacking cough 3) swelling in the hands, feet or stomach 4) if you have to sleep on extra pillows at night in order to breathe.   Complete by: As directed    Avoid straining   Complete by: As directed    Diet - low sodium heart healthy   Complete by: As directed    Discharge instructions   Complete by: As directed    Take u meds as indicated.  Try to limit your fluid intake to about 1500 cc in the winter 1800 in the summer-do not add salt to your food Take your medications regularly specifically her Aldactone, follow the instructions about weight gain as well as fluid gain and monitor this closely Try to quit smoking Get labs in a week at your primary physician's office as medications may need to be adjusted and please follow-up with them at that  time Please eat foods that are pleasurable to you but try to limit salt to some degree If you have high fevers chills nausea vomiting severe chest pain etc. return to the emergency room   Heart Failure patients record your daily weight using the same scale at the same time of day   Complete by: As directed    Increase activity slowly   Complete by: As directed    Increase activity slowly   Complete by: As directed    STOP any activity that causes chest pain, shortness of breath, dizziness, sweating, or exessive weakness   Complete by: As directed       Allergies as of 09/04/2023       Reactions   Influenza Vac Split Quad Shortness Of Breath   Ditropan Xl [oxybutynin] Other (See Comments)   Unknown reaction   Simvastatin Rash        Medication List     TAKE these medications    acetaminophen 500 MG tablet Commonly known as: TYLENOL Take 500 mg by mouth 5 (five) times daily.   acyclovir ointment 5 % Commonly known as: ZOVIRAX Apply 1 application topically 3 (three) times daily as needed (  outbreaks).   aspirin EC 81 MG tablet Take 1 tablet (81 mg total) by mouth daily. Swallow whole.   B-complex with vitamin C tablet Take 1 tablet by mouth daily.   Combivent Respimat 20-100 MCG/ACT Aers respimat Generic drug: Ipratropium-Albuterol Inhale 1 puff into the lungs 4 (four) times daily as needed for shortness of breath.   diazepam 5 MG tablet Commonly known as: VALIUM Take 1 tablet (5 mg total) by mouth daily as needed for anxiety. What changed: how much to take   ferrous sulfate 325 (65 FE) MG tablet Take 1 tablet (325 mg total) by mouth daily with breakfast.   furosemide 20 MG tablet Commonly known as: LASIX Take 1 tablet (20 mg total) by mouth daily as needed (weight gain of >3 lbs in 1 day or >5 lbs in 1 week).   guaiFENesin 600 MG 12 hr tablet Commonly known as: MUCINEX Take 1 tablet (600 mg total) by mouth 2 (two) times daily.   magic mouthwash w/lidocaine  Soln Take 5 mLs by mouth 4 (four) times daily as needed for mouth pain.   metoprolol succinate 25 MG 24 hr tablet Commonly known as: TOPROL-XL Take 12.5 mg by mouth at bedtime.   oxyCODONE 5 MG immediate release tablet Commonly known as: Oxy IR/ROXICODONE Take 0.5 tablets (2.5 mg total) by mouth every 4 (four) hours as needed for moderate pain. What changed: how much to take   pantoprazole 40 MG tablet Commonly known as: PROTONIX Take 1 tablet (40 mg total) by mouth daily. What changed:  when to take this reasons to take this   sertraline 100 MG tablet Commonly known as: ZOLOFT Take 100 mg by mouth at bedtime.   spironolactone 25 MG tablet Commonly known as: ALDACTONE Take 0.5 tablets (12.5 mg total) by mouth daily.   Symbicort 160-4.5 MCG/ACT inhaler Generic drug: budesonide-formoterol Inhale 2 puffs into the lungs 2 (two) times daily.       Allergies  Allergen Reactions   Influenza Vac Split Quad Shortness Of Breath   Ditropan Xl [Oxybutynin] Other (See Comments)    Unknown reaction   Simvastatin Rash    Follow-up Information     [facility]. Go in 22 day(s).   Specialty: Cardiology Why: Hospital follow up 09/24/2023 @ 1:30 pm PLEASE bring a current medication list to appointment FREE valet parking, entrance C, off [employer] information: [address] [place] [address] [phone]                 The results of significant diagnostics from this hospitalization (including imaging, microbiology, ancillary and laboratory) are listed below for reference.    Significant Diagnostic Studies: DG Chest Port 1 View  Result Date: 09/02/2023 CLINICAL DATA:  Fever EXAM: PORTABLE CHEST 1 VIEW COMPARISON:  09/01/2023 FINDINGS: Single frontal view of the chest demonstrates an unremarkable cardiac silhouette. No airspace disease, effusion, or pneumothorax. No acute bony abnormality.  IMPRESSION: 1. No acute intrathoracic process. Electronically Signed   By: Sharlet Salina M.D.   On: 09/02/2023 22:50   DG Chest 2 View  Result Date: 09/01/2023 CLINICAL DATA:  Pneumonia. EXAM: CHEST - 2 VIEW COMPARISON:  08/30/2023 and CT chest 03/07/2022. FINDINGS: Trachea is midline. Heart is at the upper limits of normal in size to mildly enlarged, stable. Thoracic aorta is calcified. Very fine interstitial prominence with probable basilar peripheral septal lines. Tiny bilateral pleural effusions. Osteopenia with scattered compression deformities. IMPRESSION: Suspect mild  congestive heart failure. Electronically Signed   By: Leanna Battles M.D.   On: 09/01/2023 15:58   DG ESOPHAGUS W SINGLE CM (SOL OR THIN BA)  Result Date: 09/01/2023 CLINICAL DATA:  Coughing after eating and drinking. EXAM: ESOPHOGRAM/BARIUM SWALLOW TECHNIQUE: Single contrast examination was performed using  thin barium. FLUOROSCOPY: Radiation Exposure Index (as provided by the fluoroscopic device): 2.3 mGy Kerma COMPARISON:  None Available. FINDINGS: Limited examination was performed due to patient condition. Somewhat sluggish esophageal motility with proximal escape. Slight narrowing in the upper esophagus, through which a 13 mm barium tablet would not pass. No esophageal fold thickening. IMPRESSION: 1. Slight narrowing in the upper esophagus, through which a 13 mm barium tablet would not pass. 2. Esophageal dysmotility. Electronically Signed   By: Leanna Battles M.D.   On: 09/01/2023 15:55   ECHOCARDIOGRAM COMPLETE  Result Date: 08/31/2023    ECHOCARDIOGRAM REPORT   Patient Name:   ITZELA RANDALL Date of Exam: 08/31/2023 Medical Rec #:  [id number]    Height:       56.0 in Accession #:    [id number]   Weight:       84.9 lb Date of Birth:  06-04-1936     BSA:          1.233 m Patient Age:    87 years     BP:           134/58 mmHg Patient Gender: F            HR:           82 bpm. Exam Location:  Inpatient Procedure: 2D Echo, 3D  Echo, Cardiac Doppler, Color Doppler and Strain Analysis Indications:    CHF Acute Diastolic  History:        Patient has prior history of Echocardiogram examinations, most                 recent 03/20/2022. COPD; Risk Factors:Current Smoker and                 Hypertension.  Sonographer:    Karma Ganja Referring Phys: [id number] DAVID MANUEL ORTIZ  Sonographer Comments: Global longitudinal strain was attempted. IMPRESSIONS  1. Left ventricular ejection fraction, by estimation, is 55 to 60%. Left ventricular ejection fraction by 3D volume is 56 %. The left ventricle has normal function. The left ventricle has no regional wall motion abnormalities. There is moderate left ventricular hypertrophy of the basal-septal segment. Left ventricular diastolic parameters are consistent with Grade I diastolic dysfunction (impaired relaxation). Elevated left ventricular end-diastolic pressure. The average left ventricular global longitudinal strain is -17.7 %. The global longitudinal strain is normal.  2. Right ventricular systolic function is normal. The right ventricular size is normal.  3. Right atrial size was mildly dilated.  4. The mitral valve is normal in structure. Trivial mitral valve regurgitation. No evidence of mitral stenosis.  5. The aortic valve was not well visualized. Aortic valve regurgitation is not visualized. No aortic stenosis is present. Aortic valve area, by VTI measures 2.21 cm. Aortic valve mean gradient measures 5.0 mmHg. Aortic valve Vmax measures 1.45 m/s.  6. The inferior vena cava is normal in size with greater than 50% respiratory variability, suggesting right atrial pressure of 3 mmHg. FINDINGS  Left Ventricle: Left ventricular ejection fraction, by estimation, is 55 to 60%. Left ventricular ejection fraction by 3D volume is 56 %. The left ventricle has normal function. The left ventricle has no regional wall motion abnormalities. The  average left ventricular global longitudinal strain is -17.7 %.  The global longitudinal strain is normal. The left ventricular internal cavity size was normal in size. There is moderate left ventricular hypertrophy of the basal-septal segment. Left ventricular diastolic parameters are consistent with Grade I diastolic dysfunction (impaired relaxation). Elevated left ventricular end-diastolic pressure. Right Ventricle: The right ventricular size is normal. No increase in right ventricular wall thickness. Right ventricular systolic function is normal. Left Atrium: Left atrial size was normal in size. Right Atrium: Right atrial size was mildly dilated. Pericardium: There is no evidence of pericardial effusion. Mitral Valve: The mitral valve is normal in structure. Trivial mitral valve regurgitation. No evidence of mitral valve stenosis. Tricuspid Valve: The tricuspid valve is normal in structure. Tricuspid valve regurgitation is not demonstrated. No evidence of tricuspid stenosis. Aortic Valve: The aortic valve was not well visualized. Aortic valve regurgitation is not visualized. No aortic stenosis is present. Aortic valve mean gradient measures 5.0 mmHg. Aortic valve peak gradient measures 8.4 mmHg. Aortic valve area, by VTI measures 2.21 cm. Pulmonic Valve: The pulmonic valve was normal in structure. Pulmonic valve regurgitation is not visualized. No evidence of pulmonic stenosis. Aorta: The aortic root is normal in size and structure. Venous: The inferior vena cava is normal in size with greater than 50% respiratory variability, suggesting right atrial pressure of 3 mmHg. IAS/Shunts: No atrial level shunt detected by color flow Doppler.  LEFT VENTRICLE PLAX 2D LVIDd:         3.70 cm         Diastology LVIDs:         2.80 cm         LV e' medial:    5.11 cm/s LV PW:         0.90 cm         LV E/e' medial:  18.5 LV IVS:        1.40 cm         LV e' lateral:   5.55 cm/s LVOT diam:     1.80 cm         LV E/e' lateral: 17.0 LV SV:         63 LV SV Index:   51              2D LVOT  Area:     2.54 cm        Longitudinal                                Strain                                2D Strain GLS  -17.8 %                                (A2C):                                2D Strain GLS  -17.4 %                                (A3C):  2D Strain GLS  -17.9 %                                (A4C):                                2D Strain GLS  -17.7 %                                Avg:                                 3D Volume EF                                LV 3D EF:    Left                                             ventricul                                             ar                                             ejection                                             fraction                                             by 3D                                             volume is                                             56 %.                                 3D Volume EF:                                3D EF:        56 %  LV EDV:       93 ml                                LV ESV:       41 ml                                LV SV:        52 ml RIGHT VENTRICLE RV Basal diam:  3.40 cm RV S prime:     10.40 cm/s TAPSE (M-mode): 2.4 cm LEFT ATRIUM             Index        RIGHT ATRIUM           Index LA diam:        3.50 cm 2.84 cm/m   RA Area:     13.80 cm LA Vol (A2C):   44.1 ml 35.76 ml/m  RA Volume:   35.50 ml  28.79 ml/m LA Vol (A4C):   31.6 ml 25.62 ml/m LA Biplane Vol: 40.3 ml 32.68 ml/m  AORTIC VALVE AV Area (Vmax):    1.91 cm AV Area (Vmean):   1.92 cm AV Area (VTI):     2.21 cm AV Vmax:           145.00 cm/s AV Vmean:          97.700 cm/s AV VTI:            0.286 m AV Peak Grad:      8.4 mmHg AV Mean Grad:      5.0 mmHg LVOT Vmax:         109.00 cm/s LVOT Vmean:        73.700 cm/s LVOT VTI:          0.248 m LVOT/AV VTI ratio: 0.87  AORTA Ao Root diam: 2.60 cm MITRAL VALVE MV Area (PHT): 3.83 cm     SHUNTS MV Decel  Time: 198 msec     Systemic VTI:  0.25 m MV E velocity: 94.30 cm/s   Systemic Diam: 1.80 cm MV A velocity: 127.00 cm/s MV E/A ratio:  0.74 Armanda Magic MD Electronically signed by Armanda Magic MD Signature Date/Time: 08/31/2023/10:13:53 AM    Final    DG Chest Portable 1 View  Result Date: 08/30/2023 CLINICAL DATA:  Cough.  Shortness of breath. EXAM: PORTABLE CHEST 1 VIEW COMPARISON:  03/12/2023. FINDINGS: There are diffuse reticulonodular opacities throughout bilateral lungs, which is nonspecific. No acute consolidation or lung collapse. Bilateral lateral costophrenic angles are clear. Stable cardio-mediastinal silhouette. No acute osseous abnormalities. The soft tissues are within normal limits. IMPRESSION: *Diffuse reticulonodular opacities throughout bilateral lungs, which is nonspecific. Differential diagnosis includes underlying chronic interstitial lung disease, atypical infection, pulmonary edema, etc. Correlate clinically. No other acute cardiopulmonary abnormality. Electronically Signed   By: Jules Schick M.D.   On: 08/30/2023 12:06    Microbiology: Recent Results (from the past 240 hour(s))  Resp panel by RT-PCR (RSV, Flu A&B, Covid) Anterior Nasal Swab     Status: None   Collection Time: 08/30/23 10:00 AM   Specimen: Anterior Nasal Swab  Result Value Ref Range Status   SARS Coronavirus 2 by RT PCR NEGATIVE NEGATIVE Final    Comment: (NOTE) SARS-CoV-2 target nucleic acids are NOT DETECTED.  The SARS-CoV-2 RNA is generally detectable in upper respiratory specimens  during the acute phase of infection. The lowest concentration of SARS-CoV-2 viral copies this assay can detect is 138 copies/mL. A negative result does not preclude SARS-Cov-2 infection and should not be used as the sole basis for treatment or other patient management decisions. A negative result may occur with  improper specimen collection/handling, submission of specimen other than nasopharyngeal swab, presence of  viral mutation(s) within the areas targeted by this assay, and inadequate number of viral copies(<138 copies/mL). A negative result must be combined with clinical observations, patient history, and epidemiological information. The expected result is Negative.  Fact Sheet for Patients:  BloggerCourse.com  Fact Sheet for Healthcare Providers:  SeriousBroker.it  This test is no t yet approved or cleared by the [place] FDA and  has been authorized for detection and/or diagnosis of SARS-CoV-2 by FDA under an Emergency Use Authorization (EUA). This EUA will remain  in effect (meaning this test can be used) for the duration of the COVID-19 declaration under Section 564(b)(1) of the Act, 21 U.S.C.section 360bbb-3(b)(1), unless the authorization is terminated  or revoked sooner.       Influenza A by PCR NEGATIVE NEGATIVE Final   Influenza B by PCR NEGATIVE NEGATIVE Final    Comment: (NOTE) The Xpert Xpress SARS-CoV-2/FLU/RSV plus assay is intended as an aid in the diagnosis of influenza from Nasopharyngeal swab specimens and should not be used as a sole basis for treatment. Nasal washings and aspirates are unacceptable for Xpert Xpress SARS-CoV-2/FLU/RSV testing.  Fact Sheet for Patients: BloggerCourse.com  Fact Sheet for Healthcare Providers: SeriousBroker.it  This test is not yet approved or cleared by the [place] FDA and has been authorized for detection and/or diagnosis of SARS-CoV-2 by FDA under an Emergency Use Authorization (EUA). This EUA will remain in effect (meaning this test can be used) for the duration of the COVID-19 declaration under Section 564(b)(1) of the Act, 21 U.S.C. section 360bbb-3(b)(1), unless the authorization is terminated or revoked.     Resp Syncytial Virus by PCR NEGATIVE NEGATIVE Final    Comment: (NOTE) Fact Sheet for  Patients: BloggerCourse.com  Fact Sheet for Healthcare Providers: SeriousBroker.it  This test is not yet approved or cleared by the [place] FDA and has been authorized for detection and/or diagnosis of SARS-CoV-2 by FDA under an Emergency Use Authorization (EUA). This EUA will remain in effect (meaning this test can be used) for the duration of the COVID-19 declaration under Section 564(b)(1) of the Act, 21 U.S.C. section 360bbb-3(b)(1), unless the authorization is terminated or revoked.  Performed at [facility], [address], [place], [place] [address]   Respiratory (~20 pathogens) panel by PCR     Status: None   Collection Time: 08/30/23 10:00 AM   Specimen: Nasopharyngeal Swab; Respiratory  Result Value Ref Range Status   Adenovirus NOT DETECTED NOT DETECTED Final   Coronavirus 229E NOT DETECTED NOT DETECTED Final    Comment: (NOTE) The Coronavirus on the Respiratory Panel, DOES NOT test for the novel  Coronavirus (2019 nCoV)    Coronavirus HKU1 NOT DETECTED NOT DETECTED Final   Coronavirus NL63 NOT DETECTED NOT DETECTED Final   Coronavirus OC43 NOT DETECTED NOT DETECTED Final   Metapneumovirus NOT DETECTED NOT DETECTED Final   Rhinovirus / Enterovirus NOT DETECTED NOT DETECTED Final   Influenza A NOT DETECTED NOT DETECTED Final   Influenza B NOT DETECTED NOT DETECTED Final   Parainfluenza Virus 1 NOT DETECTED NOT DETECTED Final   Parainfluenza Virus 2 NOT DETECTED  NOT DETECTED Final   Parainfluenza Virus 3 NOT DETECTED NOT DETECTED Final   Parainfluenza Virus 4 NOT DETECTED NOT DETECTED Final   Respiratory Syncytial Virus NOT DETECTED NOT DETECTED Final   Bordetella pertussis NOT DETECTED NOT DETECTED Final   Bordetella Parapertussis NOT DETECTED NOT DETECTED Final   Chlamydophila pneumoniae NOT DETECTED NOT DETECTED Final   Mycoplasma pneumoniae NOT DETECTED NOT DETECTED Final    Comment:  Performed at [facility], [address] [place], [place] [address]  Culture, blood (x 2)     Status: None (Preliminary result)   Collection Time: 09/02/23 11:10 PM   Specimen: BLOOD  Result Value Ref Range Status   Specimen Description   Final    BLOOD BLOOD RIGHT ARM Performed at [facility], [address], [place], [place] [address]    Special Requests   Final    BOTTLES DRAWN AEROBIC AND ANAEROBIC Blood Culture adequate volume Performed at [facility], [address] [place], [place] [address]    Culture   Final    NO GROWTH 1 DAY Performed at [facility], [address] [place], [place] [address]    Report Status PENDING  Incomplete  Culture, blood (x 2)     Status: None (Preliminary result)   Collection Time: 09/02/23 11:10 PM   Specimen: BLOOD  Result Value Ref Range Status   Specimen Description   Final    BLOOD BLOOD RIGHT HAND Performed at [facility], [address], [place], [place] [address]    Special Requests   Final    BOTTLES DRAWN AEROBIC AND ANAEROBIC Blood Culture adequate volume Performed at [facility], [address], [place], [place] [address]    Culture   Final    NO GROWTH 1 DAY Performed at [facility], [address] [place], [place] [address]    Report Status PENDING  Incomplete     Labs: Basic Metabolic Panel: Recent Labs  Lab 08/30/23 1000 08/31/23 0934 09/01/23 0511 09/02/23 0531 09/02/23 2310 09/03/23 0530 09/04/23 0554  NA 139   < > 139 144 139 141 137  K 3.4*   < > 3.4* 4.5 3.8 3.5 3.9  CL 96*   < > 94* 98 97* 105 100  CO2 32   < > 38* 37* 32 29 29  GLUCOSE 102*   < > 90 83 114* 80 87  BUN 11   < > 26* 23 21 18 23   CREATININE 0.80   < > 0.88 1.13* 1.17* 0.92 1.21*  CALCIUM 9.0   < > 8.7* 9.3 9.2 7.6* 8.9  MG 1.7  --   --   --   --   --   --    < > = values in this interval not displayed.   Liver Function Tests: Recent  Labs  Lab 08/30/23 1000 09/02/23 0531 09/02/23 2310 09/03/23 0530 09/04/23 0554  AST 13* 14* 15 14* 16  ALT 10 9 10 10 11   ALKPHOS 76 73 70 58 59  BILITOT 0.7 0.6 0.7 0.4 0.5  PROT 7.5 7.2 7.3 6.0* 6.8  ALBUMIN 3.1* 3.0* 3.1* 2.7* 3.0*   No results for input(s): "LIPASE", "AMYLASE" in the last 168 hours. No results for input(s): "AMMONIA" in the last 168 hours. CBC: Recent Labs  Lab 08/30/23 1000 08/31/23 0934 09/01/23 0511 09/02/23 0531 09/02/23 2310 09/03/23 0530  WBC 17.0* 18.8* 10.9* 10.2 11.6*  9.6  NEUTROABS 15.1*  --   --   --  9.2*  --   HGB 12.4 13.6 12.2 12.8 12.7 12.8  HCT 40.8 43.7 41.0 42.6 42.1 43.4  MCV 94.4 94.8 94.3 97.7 97.2 98.0  PLT 337 366 309 337 364 334   Cardiac Enzymes: No results for input(s): "CKTOTAL", "CKMB", "CKMBINDEX", "TROPONINI" in the last 168 hours. BNP: BNP (last 3 results) Recent Labs    08/30/23 1000  BNP 429.9*    ProBNP (last 3 results) No results for input(s): "PROBNP" in the last 8760 hours.  CBG: No results for input(s): "GLUCAP" in the last 168 hours.     Signed:  Rhetta Mura MD   [facility] 09/04/2023, 11:44 AM

## 2023-09-04 NOTE — Plan of Care (Signed)
  Problem: Education: Goal: Knowledge of General Education information will improve Description: Including pain rating scale, medication(s)/side effects and non-pharmacologic comfort measures Outcome: Completed/Met   Problem: Health Behavior/Discharge Planning: Goal: Ability to manage health-related needs will improve Outcome: Progressing   Problem: Clinical Measurements: Goal: Ability to maintain clinical measurements within normal limits will improve Outcome: Progressing Goal: Will remain free from infection Outcome: Progressing Goal: Diagnostic test results will improve Outcome: Progressing Goal: Respiratory complications will improve Outcome: Progressing Goal: Cardiovascular complication will be avoided Outcome: Completed/Met   Problem: Activity: Goal: Risk for activity intolerance will decrease Outcome: Progressing   Problem: Nutrition: Goal: Adequate nutrition will be maintained Outcome: Adequate for Discharge   Problem: Coping: Goal: Level of anxiety will decrease Outcome: Adequate for Discharge   Problem: Elimination: Goal: Will not experience complications related to bowel motility Outcome: Completed/Met Goal: Will not experience complications related to urinary retention Outcome: Completed/Met   Problem: Pain Management: Goal: General experience of comfort will improve Outcome: Completed/Met   Problem: Safety: Goal: Ability to remain free from injury will improve Outcome: Progressing   Problem: Skin Integrity: Goal: Risk for impaired skin integrity will decrease Outcome: Completed/Met   Problem: Education: Goal: Ability to demonstrate management of disease process will improve Outcome: Progressing Goal: Ability to verbalize understanding of medication therapies will improve Outcome: Progressing Goal: Individualized Educational Video(s) Outcome: Not Applicable   Problem: Activity: Goal: Capacity to carry out activities will improve Outcome:  Progressing   Problem: Cardiac: Goal: Ability to achieve and maintain adequate cardiopulmonary perfusion will improve Outcome: Completed/Met   Problem: Activity: Goal: Ability to tolerate increased activity will improve Outcome: Progressing   Problem: Clinical Measurements: Goal: Ability to maintain a body temperature in the normal range will improve Outcome: Progressing   Problem: Respiratory: Goal: Ability to maintain adequate ventilation will improve Outcome: Progressing Goal: Ability to maintain a clear airway will improve Outcome: Progressing

## 2023-09-04 NOTE — Progress Notes (Signed)
Mobility Specialist - Progress Note  Pre-mobility: 87% SPO2 (RA) (2L [place]) Pre-mobility: 91% SPO2  During mobility: 88% SpO2 Post-mobility: 93% SPO2   09/04/23 1045  Mobility  Activity Ambulated with assistance in hallway;Ambulated with assistance to bathroom  Level of Assistance Contact guard assist, steadying assist  Assistive Device Other (Comment) (HHA)  Distance Ambulated (ft) 80 ft  Range of Motion/Exercises Active  Activity Response Tolerated fair  Mobility Referral Yes  $Mobility charge 1 Mobility  Mobility Specialist Start Time (ACUTE ONLY) 1020  Mobility Specialist Stop Time (ACUTE ONLY) 1045  Mobility Specialist Time Calculation (min) (ACUTE ONLY) 25 min   Pt was found in bed and agreeable to ambulate. During session stated she felt her legs were "wobbly". SPO2 fluctuated during session between 88-93% SPO2. Pt returned to use bathroom and had a BM. At EOS returned to recliner chair with all needs met. Call bell in reach and chair alarm on.   Billey Chang Mobility Specialist

## 2023-09-04 NOTE — Plan of Care (Signed)
Problem: Education: Goal: Knowledge of General Education information will improve Description: Including pain rating scale, medication(s)/side effects and non-pharmacologic comfort measures Outcome: Completed/Met   Problem: Health Behavior/Discharge Planning: Goal: Ability to manage health-related needs will improve 09/04/2023 1254 by Loel Ro, RN Outcome: Completed/Met 09/04/2023 1137 by Loel Ro, RN Outcome: Progressing   Problem: Clinical Measurements: Goal: Ability to maintain clinical measurements within normal limits will improve 09/04/2023 1254 by Loel Ro, RN Outcome: Completed/Met 09/04/2023 1137 by Loel Ro, RN Outcome: Progressing Goal: Will remain free from infection 09/04/2023 1254 by Loel Ro, RN Outcome: Completed/Met 09/04/2023 1137 by Loel Ro, RN Outcome: Progressing Goal: Diagnostic test results will improve 09/04/2023 1254 by Loel Ro, RN Outcome: Completed/Met 09/04/2023 1137 by Loel Ro, RN Outcome: Progressing Goal: Respiratory complications will improve 09/04/2023 1254 by Loel Ro, RN Outcome: Completed/Met 09/04/2023 1137 by Loel Ro, RN Outcome: Progressing Goal: Cardiovascular complication will be avoided Outcome: Completed/Met   Problem: Activity: Goal: Risk for activity intolerance will decrease 09/04/2023 1254 by Loel Ro, RN Outcome: Completed/Met 09/04/2023 1137 by Loel Ro, RN Outcome: Progressing   Problem: Nutrition: Goal: Adequate nutrition will be maintained 09/04/2023 1254 by Loel Ro, RN Outcome: Completed/Met 09/04/2023 1137 by Loel Ro, RN Outcome: Adequate for Discharge   Problem: Coping: Goal: Level of anxiety will decrease 09/04/2023 1254 by Loel Ro, RN Outcome: Completed/Met 09/04/2023 1137 by Loel Ro, RN Outcome: Adequate for Discharge   Problem: Elimination: Goal: Will not experience complications  related to bowel motility Outcome: Completed/Met Goal: Will not experience complications related to urinary retention Outcome: Completed/Met   Problem: Pain Management: Goal: General experience of comfort will improve Outcome: Completed/Met   Problem: Safety: Goal: Ability to remain free from injury will improve 09/04/2023 1254 by Loel Ro, RN Outcome: Completed/Met 09/04/2023 1137 by Loel Ro, RN Outcome: Progressing   Problem: Skin Integrity: Goal: Risk for impaired skin integrity will decrease Outcome: Completed/Met   Problem: Education: Goal: Ability to demonstrate management of disease process will improve 09/04/2023 1254 by Loel Ro, RN Outcome: Completed/Met 09/04/2023 1137 by Loel Ro, RN Outcome: Progressing Goal: Ability to verbalize understanding of medication therapies will improve 09/04/2023 1254 by Loel Ro, RN Outcome: Completed/Met 09/04/2023 1137 by Loel Ro, RN Outcome: Progressing Goal: Individualized Educational Video(s) Outcome: Not Applicable   Problem: Activity: Goal: Capacity to carry out activities will improve 09/04/2023 1254 by Loel Ro, RN Outcome: Completed/Met 09/04/2023 1137 by Loel Ro, RN Outcome: Progressing   Problem: Cardiac: Goal: Ability to achieve and maintain adequate cardiopulmonary perfusion will improve Outcome: Completed/Met   Problem: Activity: Goal: Ability to tolerate increased activity will improve 09/04/2023 1254 by Loel Ro, RN Outcome: Completed/Met 09/04/2023 1137 by Loel Ro, RN Outcome: Progressing   Problem: Clinical Measurements: Goal: Ability to maintain a body temperature in the normal range will improve 09/04/2023 1254 by Loel Ro, RN Outcome: Completed/Met 09/04/2023 1137 by Loel Ro, RN Outcome: Progressing   Problem: Respiratory: Goal: Ability to maintain adequate ventilation will improve 09/04/2023 1254 by Loel Ro, RN Outcome: Completed/Met 09/04/2023 1137 by Loel Ro, RN Outcome: Progressing Goal: Ability to maintain a clear airway will improve 09/04/2023 1254 by Loel Ro, RN Outcome: Completed/Met 09/04/2023 1137 by Loel Ro, RN Outcome: Progressing   Problem: Acute Rehab PT Goals(only PT should resolve) Goal: Pt Will Ambulate Outcome: Completed/Met Goal: Pt/caregiver  will Perform Home Exercise Program Outcome: Completed/Met   Problem: Acute Rehab OT Goals (only OT should resolve) Goal: Pt. Will Perform Grooming Outcome: Completed/Met Goal: Pt. Will Perform Lower Body Dressing Outcome: Completed/Met Goal: Pt. Will Transfer To Toilet Outcome: Completed/Met Goal: Pt. Will Perform Toileting-Clothing Manipulation Outcome: Completed/Met

## 2023-09-05 ENCOUNTER — Encounter (HOSPITAL_COMMUNITY): Payer: Self-pay | Admitting: Gastroenterology

## 2023-09-08 LAB — CULTURE, BLOOD (ROUTINE X 2)
Culture: NO GROWTH
Culture: NO GROWTH
Special Requests: ADEQUATE
Special Requests: ADEQUATE

## 2023-09-23 NOTE — Progress Notes (Addendum)
HEART & VASCULAR TRANSITION OF CARE CLINIC NOTE     Referring Physician: Dr. Mahala Menghini Primary Care: Dr Kinnie Feil Primary Cardiologist: Previously Dr Houston Siren at [facility]  HPI: Referred to clinic by Dr Mahala Menghini for post-hospital follow-up.  Julia Hernandez is a 87 year old with history of HFpEF, non rheumatic mitral regurgitation, osteoporosis, compression fracture, IDA, CAD, COPD, CKD IIIa, dysphagia/esophageal stricture, tobacco use. Previously followed with Cardiology at [facility].   Admitted 06/24/22 with A/C HFpEF and Acute Hypoxic Respiratory Failure. Diuresed with IV lasix and transitioned to 20 mg po lasix as needed. Discharge weight 101 pounds.   She was seen in HF [facility] clinic 09/23. Volume stable. Started on spironolactone. She has not followed up with her Cardiologist since then.  Admitted to [facility] 11/24 with acute respiratory failure with hypoxia 2/2 pneumonitis vs subclinical aspiration and possible component of acute on chronic HFpEF. GI consulted and had EGD which showed esophageal stenosis s/p dilatation and injection with botox.  She diuresed with low dose IV lasix X 1 day then resumed po lasix. Discharged on PRN diuretic.   She is here today for hospital follow-up. Accompanied by her daughter. Reports she has been doing much better. Does get fatigued and short of breath walking more than short distances which is not new. She continues to live alone in an Apartment. Home weight stable around 85 lb. Has been struggling to gain weight. No orthopnea, PND or lower extremity edema.   Her medications are bubble packed. BP averages 140s/80s at home.  Smoking 2 cigarettes daily.     Past Medical History:  Diagnosis Date   Asthma    COPD (chronic obstructive pulmonary disease) (HCC)    Hypertension     Current Outpatient Medications  Medication Sig Dispense Refill   acetaminophen (TYLENOL) 500 MG tablet Take 500 mg by mouth 5 (five) times daily.     acyclovir ointment (ZOVIRAX) 5  % Apply 1 application topically 3 (three) times daily as needed (outbreaks).     aspirin EC 81 MG tablet Take 1 tablet (81 mg total) by mouth daily. Swallow whole. 30 tablet 0   B Complex-C (B-COMPLEX WITH VITAMIN C) tablet Take 1 tablet by mouth daily. 30 tablet 0   diazepam (VALIUM) 5 MG tablet Take 1 tablet (5 mg total) by mouth daily as needed for anxiety. (Patient taking differently: Take 2.5 mg by mouth daily as needed for anxiety.) 3 tablet 0   ferrous sulfate 325 (65 FE) MG tablet Take 1 tablet (325 mg total) by mouth daily with breakfast.     furosemide (LASIX) 20 MG tablet Take 1 tablet (20 mg total) by mouth daily as needed (weight gain of >3 lbs in 1 day or >5 lbs in 1 week). 30 tablet 0   guaiFENesin (MUCINEX) 600 MG 12 hr tablet Take 1 tablet (600 mg total) by mouth 2 (two) times daily. 60 tablet 0   Ipratropium-Albuterol (COMBIVENT RESPIMAT) 20-100 MCG/ACT AERS respimat Inhale 1 puff into the lungs 4 (four) times daily as needed for shortness of breath.     losartan (COZAAR) 25 MG tablet Take 0.5 tablets (12.5 mg total) by mouth daily. 45 tablet 3   magic mouthwash w/lidocaine SOLN Take 5 mLs by mouth 4 (four) times daily as needed for mouth pain.     pantoprazole (PROTONIX) 40 MG tablet Take 1 tablet (40 mg total) by mouth daily. (Patient taking differently: Take 40 mg by mouth daily as needed (for acid reflux).) 30 tablet  0   sertraline (ZOLOFT) 100 MG tablet Take 100 mg by mouth at bedtime.     SYMBICORT 160-4.5 MCG/ACT inhaler Inhale 2 puffs into the lungs 2 (two) times daily.     metoprolol succinate (TOPROL-XL) 50 MG 24 hr tablet Take 1 tablet (50 mg total) by mouth at bedtime.     oxyCODONE (OXY IR/ROXICODONE) 5 MG immediate release tablet Take 0.5 tablets (2.5 mg total) by mouth every 4 (four) hours as needed for moderate pain. (Patient not taking: Reported on 09/24/2023) 15 tablet 0   spironolactone (ALDACTONE) 25 MG tablet Take 1 tablet (25 mg total) by mouth daily.     No  current facility-administered medications for this encounter.    Allergies  Allergen Reactions   Influenza Vac Split Quad Shortness Of Breath   Ditropan Xl [Oxybutynin] Other (See Comments)    Unknown reaction   Simvastatin Rash      Social History   Socioeconomic History   Marital status: Divorced    Spouse name: Not on file   Number of children: 1   Years of education: Not on file   Highest education level: High school graduate  Occupational History   Occupation: Retired  Tobacco Use   Smoking status: Every Day    Current packs/day: 0.50    Average packs/day: 0.5 packs/day for 60.0 years (30.0 ttl pk-yrs)    Types: Cigarettes   Smokeless tobacco: Never  Vaping Use   Vaping status: Never Used  Substance and Sexual Activity   Alcohol use: No   Drug use: Never   Sexual activity: Not on file  Other Topics Concern   Not on file  Social History Narrative   Not on file   Social Drivers of Health   Financial Resource Strain: Low Risk  (09/03/2023)   Overall Financial Resource Strain (CARDIA)    Difficulty of Paying Living Expenses: Not very hard  Food Insecurity: No Food Insecurity (08/30/2023)   Hunger Vital Sign    Worried About Running Out of Food in the Last Year: Never true    Ran Out of Food in the Last Year: Never true  Transportation Needs: No Transportation Needs (08/30/2023)   PRAPARE - Administrator, Civil Service (Medical): No    Lack of Transportation (Non-Medical): No  Physical Activity: Not on file  Stress: Not on file  Social Connections: Unknown (02/12/2022)   Received from [facility], [facility]   Social Network    Social Network: Not on file  Intimate Partner Violence: Not At Risk (08/30/2023)   Humiliation, Afraid, Rape, and Kick questionnaire    Fear of Current or Ex-Partner: No    Emotionally Abused: No    Physically Abused: No    Sexually Abused: No     No family history on file.  Vitals:   09/24/23 1321  BP: (!)  142/82  Pulse: 97  SpO2: 92%  Weight: 39.7 kg (87 lb 9.6 oz)    Wt Readings from Last 3 Encounters:  09/24/23 39.7 kg (87 lb 9.6 oz)  09/04/23 37.8 kg (83 lb 5.3 oz)  07/07/22 43.2 kg (95 lb 3.2 oz)    PHYSICAL EXAM: General:  Thin elderly female. Arrived in wheelchair. HEENT: normal Neck: supple. no JVD. Carotids 2+ bilat; no bruits.  Cor: PMI nondisplaced. Regular rate & rhythm. No rubs, gallops or murmurs. Lungs: diminished Abdomen: soft, nontender, nondistended.  Extremities: no cyanosis, clubbing, rash, edema Neuro: alert & orientedx3. Affect pleasant  ECG: SR  66 bpm, RBBB  ASSESSMENT & PLAN: 1. HFpEF  -Echo 11/24: EF 55-60%, moderate LVH of basal-septal segment, RV okay -NYHA early III. Suspect COPD contributing to some of her dyspnea. Volume looks good on exam. Has lasix to use as needed.  -Continue spiro 25 mg daily -Add losartan 12.5 mg daily -Can consider SGLT2i next -Will request last labs from PCP's office, reports they were done 12/02.  -Repeat BMET/BNP in 2 weeks.  2. CAD - Cath at [facility] in setting of NSTEMI. Had 90-95% p LAD, diffuse disease D1, 40-50% m Lcx, 40-50% involving 2 marginal branches. S/p  PTCA/DES LAD and POBA to diagonal branch. - On aspirin + bb - Consider restarting statin therapy when sees Cardiology. Unclear why atorvastatin was previously stopped. - No chest pain.   3. CKD Stage IIIa - Cr averages 1-1.2 - Repeat labs in 2 weeks  4. HTN - BP elevated today - Meds changes as above  5. Tobacco use - Still smoking 2 cigarettes/day - Smoking cessation discussed    Follow up: As needed, keep appointment to establish with [facility] in January 2025.  Hakeem Frazzini N PA-C 2:34 PM

## 2023-09-24 ENCOUNTER — Ambulatory Visit (HOSPITAL_COMMUNITY)
Admit: 2023-09-24 | Discharge: 2023-09-24 | Disposition: A | Discharge status: Home / Self Care | Payer: Medicare HMO | Attending: Physician Assistant

## 2023-09-24 ENCOUNTER — Encounter (HOSPITAL_COMMUNITY): Payer: Self-pay

## 2023-09-24 VITALS — BP 142/82 | HR 97 | Wt 87.6 lb

## 2023-09-24 DIAGNOSIS — I2510 Atherosclerotic heart disease of native coronary artery without angina pectoris: Secondary | ICD-10-CM | POA: Insufficient documentation

## 2023-09-24 DIAGNOSIS — I252 Old myocardial infarction: Secondary | ICD-10-CM | POA: Insufficient documentation

## 2023-09-24 DIAGNOSIS — I10 Essential (primary) hypertension: Secondary | ICD-10-CM | POA: Diagnosis not present

## 2023-09-24 DIAGNOSIS — I130 Hypertensive heart and chronic kidney disease with heart failure and stage 1 through stage 4 chronic kidney disease, or unspecified chronic kidney disease: Secondary | ICD-10-CM | POA: Diagnosis present

## 2023-09-24 DIAGNOSIS — Z7982 Long term (current) use of aspirin: Secondary | ICD-10-CM | POA: Insufficient documentation

## 2023-09-24 DIAGNOSIS — J4489 Other specified chronic obstructive pulmonary disease: Secondary | ICD-10-CM | POA: Diagnosis not present

## 2023-09-24 DIAGNOSIS — K222 Esophageal obstruction: Secondary | ICD-10-CM | POA: Diagnosis not present

## 2023-09-24 DIAGNOSIS — N1831 Chronic kidney disease, stage 3a: Secondary | ICD-10-CM | POA: Insufficient documentation

## 2023-09-24 DIAGNOSIS — F17210 Nicotine dependence, cigarettes, uncomplicated: Secondary | ICD-10-CM | POA: Diagnosis not present

## 2023-09-24 DIAGNOSIS — I5032 Chronic diastolic (congestive) heart failure: Secondary | ICD-10-CM | POA: Diagnosis not present

## 2023-09-24 DIAGNOSIS — Z79899 Other long term (current) drug therapy: Secondary | ICD-10-CM | POA: Insufficient documentation

## 2023-09-24 DIAGNOSIS — Z720 Tobacco use: Secondary | ICD-10-CM

## 2023-09-24 MED ORDER — SPIRONOLACTONE 25 MG PO TABS: 25.0000 mg | ORAL_TABLET | Freq: Every day | ORAL | Status: DC

## 2023-09-24 MED ORDER — LOSARTAN POTASSIUM 25 MG PO TABS: 12.5000 mg | ORAL_TABLET | Freq: Every day | ORAL | 3 refills | Status: DC

## 2023-09-24 MED ORDER — METOPROLOL SUCCINATE ER 50 MG PO TB24: 50.0000 mg | ORAL_TABLET | Freq: Every evening | ORAL | Status: DC

## 2023-09-24 NOTE — Patient Instructions (Signed)
Medication Changes:  START: LOSARTAN 12.5MG  ONCE DAILY   Lab Work:  PLEASE REFER TO [employer] [facility] FOR BLOOD WORK IN 2 WEEKS: LOCATIONS LISTED BELOW   Address: [address], [place], [place] [address] Hours:  Open ? Closes 5?PM Updated by this business 7 weeks ago Phone: [phone]  Address: [address] [place], [place] [address] Hours:  Open ? Closes 12?PM ? Reopens 1?PM Updated by this business 7 weeks ago Phone: [phone] Appointments: [email or web address]  Address: [address] [place], [place] [address] Hours:  Open ? Closes 12:30?PM ? Reopens 1:30?PM Updated by this business 7 weeks ago Phone: [phone] Appointments: [email or web address]  Address: [address] [place], [place] [address] Hours:  Open ? Closes 12?PM ? Reopens 1?PM Updated by this business 7 weeks ago Phone: [phone] Appointments: [email or web address]  Referrals:  YOU HAVE BEEN REFERRED TO GENERAL CARDIOLOGY THEY WILL REACH OUT TO YOU OR CALL TO ARRANGE THIS. PLEASE CALL us WITH ANY CONCERNS   Follow-Up in: AS NEEDED   At the [facility], you and your health needs are our priority. We have a designated team specialized in the treatment of Heart Failure. This Care Team includes your primary Heart Failure Specialized Cardiologist (physician), Advanced Practice Providers (APPs- Physician Assistants and Nurse Practitioners), and Pharmacist who all work together to provide you with the care you need, when you need it.   You may see any of the following providers on your designated Care Team at your next follow up:  Dr. Arvilla Meres Dr. Marca Ancona Dr. Dorthula Nettles Dr. Theresia Bough Tonye Becket, NP Robbie Lis, [place] [facility] [place], [place] Brynda Peon, NP [place] Lee, NP Karle Plumber, PharmD   Please be sure to bring in all your medications bottles to every appointment.   Need to Contact us:  If you have any questions or concerns before your next  appointment please send [place] a message through [place] or call our office at [phone].    TO LEAVE A MESSAGE FOR THE NURSE SELECT OPTION 2, PLEASE LEAVE A MESSAGE INCLUDING: YOUR NAME DATE OF BIRTH CALL BACK NUMBER REASON FOR CALL**this is important as we prioritize the call backs  YOU WILL RECEIVE A CALL BACK THE SAME DAY AS LONG AS YOU CALL BEFORE 4:00 PM

## 2023-11-01 ENCOUNTER — Encounter (HOSPITAL_BASED_OUTPATIENT_CLINIC_OR_DEPARTMENT_OTHER): Payer: Self-pay

## 2024-01-08 ENCOUNTER — Emergency Department (HOSPITAL_BASED_OUTPATIENT_CLINIC_OR_DEPARTMENT_OTHER)

## 2024-01-08 ENCOUNTER — Other Ambulatory Visit: Payer: Self-pay

## 2024-01-08 ENCOUNTER — Emergency Department (HOSPITAL_BASED_OUTPATIENT_CLINIC_OR_DEPARTMENT_OTHER): Admitting: Radiology

## 2024-01-08 ENCOUNTER — Encounter (HOSPITAL_BASED_OUTPATIENT_CLINIC_OR_DEPARTMENT_OTHER): Payer: Self-pay

## 2024-01-08 ENCOUNTER — Inpatient Hospital Stay (HOSPITAL_BASED_OUTPATIENT_CLINIC_OR_DEPARTMENT_OTHER)
Admission: EM | Admit: 2024-01-08 | Discharge: 2024-01-13 | DRG: 190 | Disposition: A | Discharge status: Home Health | Attending: Family Medicine | Admitting: Family Medicine

## 2024-01-08 DIAGNOSIS — N1831 Chronic kidney disease, stage 3a: Secondary | ICD-10-CM | POA: Diagnosis present

## 2024-01-08 DIAGNOSIS — H6123 Impacted cerumen, bilateral: Secondary | ICD-10-CM | POA: Diagnosis present

## 2024-01-08 DIAGNOSIS — F17210 Nicotine dependence, cigarettes, uncomplicated: Secondary | ICD-10-CM | POA: Diagnosis present

## 2024-01-08 DIAGNOSIS — I10 Essential (primary) hypertension: Secondary | ICD-10-CM | POA: Diagnosis not present

## 2024-01-08 DIAGNOSIS — I130 Hypertensive heart and chronic kidney disease with heart failure and stage 1 through stage 4 chronic kidney disease, or unspecified chronic kidney disease: Secondary | ICD-10-CM | POA: Diagnosis present

## 2024-01-08 DIAGNOSIS — J441 Chronic obstructive pulmonary disease with (acute) exacerbation: Secondary | ICD-10-CM | POA: Diagnosis present

## 2024-01-08 DIAGNOSIS — E785 Hyperlipidemia, unspecified: Secondary | ICD-10-CM | POA: Diagnosis present

## 2024-01-08 DIAGNOSIS — Z955 Presence of coronary angioplasty implant and graft: Secondary | ICD-10-CM

## 2024-01-08 DIAGNOSIS — K8020 Calculus of gallbladder without cholecystitis without obstruction: Secondary | ICD-10-CM | POA: Diagnosis present

## 2024-01-08 DIAGNOSIS — Z7951 Long term (current) use of inhaled steroids: Secondary | ICD-10-CM

## 2024-01-08 DIAGNOSIS — Z887 Allergy status to serum and vaccine status: Secondary | ICD-10-CM

## 2024-01-08 DIAGNOSIS — J9601 Acute respiratory failure with hypoxia: Principal | ICD-10-CM | POA: Diagnosis present

## 2024-01-08 DIAGNOSIS — J9621 Acute and chronic respiratory failure with hypoxia: Secondary | ICD-10-CM

## 2024-01-08 DIAGNOSIS — I5032 Chronic diastolic (congestive) heart failure: Secondary | ICD-10-CM | POA: Diagnosis present

## 2024-01-08 DIAGNOSIS — Z888 Allergy status to other drugs, medicaments and biological substances status: Secondary | ICD-10-CM | POA: Diagnosis not present

## 2024-01-08 DIAGNOSIS — Z7982 Long term (current) use of aspirin: Secondary | ICD-10-CM | POA: Diagnosis not present

## 2024-01-08 DIAGNOSIS — Z1152 Encounter for screening for COVID-19: Secondary | ICD-10-CM

## 2024-01-08 DIAGNOSIS — R7989 Other specified abnormal findings of blood chemistry: Secondary | ICD-10-CM | POA: Diagnosis not present

## 2024-01-08 DIAGNOSIS — Z79899 Other long term (current) drug therapy: Secondary | ICD-10-CM

## 2024-01-08 DIAGNOSIS — I4510 Unspecified right bundle-branch block: Secondary | ICD-10-CM | POA: Diagnosis present

## 2024-01-08 DIAGNOSIS — Z66 Do not resuscitate: Secondary | ICD-10-CM | POA: Diagnosis present

## 2024-01-08 DIAGNOSIS — I2510 Atherosclerotic heart disease of native coronary artery without angina pectoris: Secondary | ICD-10-CM | POA: Diagnosis present

## 2024-01-08 DIAGNOSIS — N200 Calculus of kidney: Secondary | ICD-10-CM | POA: Diagnosis present

## 2024-01-08 DIAGNOSIS — K5730 Diverticulosis of large intestine without perforation or abscess without bleeding: Secondary | ICD-10-CM | POA: Diagnosis present

## 2024-01-08 DIAGNOSIS — J9611 Chronic respiratory failure with hypoxia: Secondary | ICD-10-CM

## 2024-01-08 DIAGNOSIS — J449 Chronic obstructive pulmonary disease, unspecified: Secondary | ICD-10-CM

## 2024-01-08 DIAGNOSIS — K2970 Gastritis, unspecified, without bleeding: Secondary | ICD-10-CM | POA: Diagnosis present

## 2024-01-08 LAB — COMPREHENSIVE METABOLIC PANEL WITH GFR
ALT: 7 U/L (ref 0–44)
AST: 16 U/L (ref 15–41)
Albumin: 4.1 g/dL (ref 3.5–5.0)
Alkaline Phosphatase: 84 U/L (ref 38–126)
Anion gap: 8 (ref 5–15)
BUN: 13 mg/dL (ref 8–23)
CO2: 37 mmol/L — ABNORMAL HIGH (ref 22–32)
Calcium: 9.5 mg/dL (ref 8.9–10.3)
Chloride: 98 mmol/L (ref 98–111)
Creatinine, Ser: 0.97 mg/dL (ref 0.44–1.00)
GFR, Estimated: 57 mL/min — ABNORMAL LOW (ref >60)
Glucose, Bld: 117 mg/dL — ABNORMAL HIGH (ref 70–99)
Potassium: 3.6 mmol/L (ref 3.5–5.1)
Sodium: 143 mmol/L (ref 135–145)
Total Bilirubin: 0.4 mg/dL (ref 0.0–1.2)
Total Protein: 7.2 g/dL (ref 6.5–8.1)

## 2024-01-08 LAB — BRAIN NATRIURETIC PEPTIDE: B Natriuretic Peptide: 205.7 pg/mL — ABNORMAL HIGH (ref 0.0–100.0)

## 2024-01-08 LAB — URINALYSIS, ROUTINE W REFLEX MICROSCOPIC
Bacteria, UA: NONE SEEN
Bilirubin Urine: NEGATIVE
Glucose, UA: NEGATIVE mg/dL
Ketones, ur: NEGATIVE mg/dL
Leukocytes,Ua: NEGATIVE
Nitrite: NEGATIVE
Specific Gravity, Urine: >1.046 — ABNORMAL HIGH (ref 1.005–1.030)
pH: 5.5 (ref 5.0–8.0)

## 2024-01-08 LAB — CBC WITH DIFFERENTIAL/PLATELET
Abs Immature Granulocytes: 0.01 10*3/uL (ref 0.00–0.07)
Basophils Absolute: 0 10*3/uL (ref 0.0–0.1)
Basophils Relative: 0 %
Eosinophils Absolute: 0.2 10*3/uL (ref 0.0–0.5)
Eosinophils Relative: 2 %
HCT: 46 % (ref 36.0–46.0)
Hemoglobin: 14.2 g/dL (ref 12.0–15.0)
Immature Granulocytes: 0 %
Lymphocytes Relative: 19 %
Lymphs Abs: 1.6 10*3/uL (ref 0.7–4.0)
MCH: 29 pg (ref 26.0–34.0)
MCHC: 30.9 g/dL (ref 30.0–36.0)
MCV: 94.1 fL (ref 80.0–100.0)
Monocytes Absolute: 0.4 10*3/uL (ref 0.1–1.0)
Monocytes Relative: 5 %
Neutro Abs: 6.3 10*3/uL (ref 1.7–7.7)
Neutrophils Relative %: 74 %
Platelets: 196 10*3/uL (ref 150–400)
RBC: 4.89 MIL/uL (ref 3.87–5.11)
RDW: 13.2 % (ref 11.5–15.5)
WBC: 8.6 10*3/uL (ref 4.0–10.5)
nRBC: 0 % (ref 0.0–0.2)

## 2024-01-08 LAB — RESP PANEL BY RT-PCR (RSV, FLU A&B, COVID)  RVPGX2
Influenza A by PCR: NEGATIVE
Influenza B by PCR: NEGATIVE
Resp Syncytial Virus by PCR: NEGATIVE
SARS Coronavirus 2 by RT PCR: NEGATIVE

## 2024-01-08 LAB — TROPONIN I (HIGH SENSITIVITY)
Troponin I (High Sensitivity): 15 ng/L (ref <18)
Troponin I (High Sensitivity): 15 ng/L (ref <18)

## 2024-01-08 LAB — LIPASE, BLOOD: Lipase: 20 U/L (ref 11–51)

## 2024-01-08 MED ORDER — SODIUM CHLORIDE 0.9 % IV SOLN
1.0000 g | Freq: Once | INTRAVENOUS | Status: AC
  Administered 2024-01-08: 1 g via INTRAVENOUS
  Filled 2024-01-08: qty 10

## 2024-01-08 MED ORDER — IPRATROPIUM-ALBUTEROL 0.5-2.5 (3) MG/3ML IN SOLN: 3.0000 mL | RESPIRATORY_TRACT | Status: DC | PRN

## 2024-01-08 MED ORDER — ONDANSETRON HCL 4 MG/2ML IJ SOLN: 4.0000 mg | Freq: Four times a day (QID) | INTRAMUSCULAR | Status: DC | PRN

## 2024-01-08 MED ORDER — HEPARIN SODIUM (PORCINE) 5000 UNIT/ML IJ SOLN
5000.0000 [IU] | Freq: Three times a day (TID) | INTRAMUSCULAR | Status: DC
  Administered 2024-01-08 – 2024-01-13 (×13): 5000 [IU] via SUBCUTANEOUS
  Filled 2024-01-08 (×13): qty 1

## 2024-01-08 MED ORDER — MAGNESIUM SULFATE 2 GM/50ML IV SOLN
2.0000 g | Freq: Once | INTRAVENOUS | Status: AC
  Administered 2024-01-08: 2 g via INTRAVENOUS
  Filled 2024-01-08: qty 50

## 2024-01-08 MED ORDER — SODIUM CHLORIDE 0.9% FLUSH
3.0000 mL | Freq: Two times a day (BID) | INTRAVENOUS | Status: DC
  Administered 2024-01-08 – 2024-01-13 (×10): 3 mL via INTRAVENOUS

## 2024-01-08 MED ORDER — ASPIRIN 81 MG PO TBEC
81.0000 mg | DELAYED_RELEASE_TABLET | Freq: Every day | ORAL | Status: DC
  Administered 2024-01-09 – 2024-01-13 (×5): 81 mg via ORAL
  Filled 2024-01-08 (×5): qty 1

## 2024-01-08 MED ORDER — SENNOSIDES-DOCUSATE SODIUM 8.6-50 MG PO TABS: 1.0000 | ORAL_TABLET | Freq: Every evening | ORAL | Status: DC | PRN

## 2024-01-08 MED ORDER — PREDNISONE 20 MG PO TABS
40.0000 mg | ORAL_TABLET | Freq: Every day | ORAL | Status: DC
  Administered 2024-01-09 – 2024-01-13 (×5): 40 mg via ORAL
  Filled 2024-01-08 (×5): qty 2

## 2024-01-08 MED ORDER — ACETAMINOPHEN 325 MG PO TABS: 650.0000 mg | ORAL_TABLET | Freq: Four times a day (QID) | ORAL | Status: DC | PRN

## 2024-01-08 MED ORDER — ACETAMINOPHEN 650 MG RE SUPP: 650.0000 mg | Freq: Four times a day (QID) | RECTAL | Status: DC | PRN

## 2024-01-08 MED ORDER — IPRATROPIUM-ALBUTEROL 0.5-2.5 (3) MG/3ML IN SOLN: 3.0000 mL | Freq: Four times a day (QID) | RESPIRATORY_TRACT | Status: DC | PRN

## 2024-01-08 MED ORDER — BUDESONIDE 0.25 MG/2ML IN SUSP
0.2500 mg | Freq: Two times a day (BID) | RESPIRATORY_TRACT | Status: DC
  Administered 2024-01-08 – 2024-01-13 (×9): 0.25 mg via RESPIRATORY_TRACT
  Filled 2024-01-08 (×10): qty 2

## 2024-01-08 MED ORDER — FUROSEMIDE 20 MG PO TABS: 20.0000 mg | ORAL_TABLET | Freq: Every day | ORAL | Status: DC | PRN

## 2024-01-08 MED ORDER — SPIRONOLACTONE 25 MG PO TABS
25.0000 mg | ORAL_TABLET | Freq: Every day | ORAL | Status: DC
  Administered 2024-01-09 – 2024-01-13 (×5): 25 mg via ORAL
  Filled 2024-01-08 (×5): qty 1

## 2024-01-08 MED ORDER — ONDANSETRON HCL 4 MG PO TABS: 4.0000 mg | ORAL_TABLET | Freq: Four times a day (QID) | ORAL | Status: DC | PRN

## 2024-01-08 MED ORDER — GUAIFENESIN ER 600 MG PO TB12
600.0000 mg | ORAL_TABLET | Freq: Two times a day (BID) | ORAL | Status: DC
  Administered 2024-01-08 – 2024-01-13 (×10): 600 mg via ORAL
  Filled 2024-01-08 (×10): qty 1

## 2024-01-08 MED ORDER — PANTOPRAZOLE SODIUM 40 MG PO TBEC
40.0000 mg | DELAYED_RELEASE_TABLET | Freq: Every day | ORAL | Status: DC
  Administered 2024-01-08 – 2024-01-13 (×6): 40 mg via ORAL
  Filled 2024-01-08 (×6): qty 1

## 2024-01-08 MED ORDER — DOCUSATE SODIUM 100 MG PO CAPS
100.0000 mg | ORAL_CAPSULE | Freq: Once | ORAL | Status: DC
  Filled 2024-01-08: qty 1

## 2024-01-08 MED ORDER — SERTRALINE HCL 100 MG PO TABS
100.0000 mg | ORAL_TABLET | Freq: Every day | ORAL | Status: DC
  Administered 2024-01-09 – 2024-01-12 (×4): 100 mg via ORAL
  Filled 2024-01-08 (×4): qty 1

## 2024-01-08 MED ORDER — LOSARTAN POTASSIUM 25 MG PO TABS
12.5000 mg | ORAL_TABLET | Freq: Every day | ORAL | Status: DC
  Administered 2024-01-09 – 2024-01-13 (×5): 12.5 mg via ORAL
  Filled 2024-01-08 (×5): qty 1

## 2024-01-08 MED ORDER — DIAZEPAM 5 MG PO TABS
2.5000 mg | ORAL_TABLET | Freq: Every day | ORAL | Status: DC | PRN
  Administered 2024-01-09 – 2024-01-12 (×3): 2.5 mg via ORAL
  Filled 2024-01-08 (×3): qty 1

## 2024-01-08 MED ORDER — IPRATROPIUM-ALBUTEROL 0.5-2.5 (3) MG/3ML IN SOLN
6.0000 mL | Freq: Once | RESPIRATORY_TRACT | Status: AC
  Administered 2024-01-08: 6 mL via RESPIRATORY_TRACT
  Filled 2024-01-08: qty 6

## 2024-01-08 MED ORDER — SODIUM CHLORIDE 0.9 % IV SOLN
500.0000 mg | Freq: Once | INTRAVENOUS | Status: AC
  Administered 2024-01-08: 500 mg via INTRAVENOUS
  Filled 2024-01-08: qty 5

## 2024-01-08 MED ORDER — METOPROLOL SUCCINATE ER 50 MG PO TB24
50.0000 mg | ORAL_TABLET | Freq: Every day | ORAL | Status: DC
  Administered 2024-01-09 – 2024-01-12 (×4): 50 mg via ORAL
  Filled 2024-01-08 (×4): qty 1

## 2024-01-08 MED ORDER — IOHEXOL 350 MG/ML SOLN
80.0000 mL | Freq: Once | INTRAVENOUS | Status: AC | PRN
Start: 2024-01-08 — End: 2024-01-08
  Administered 2024-01-08: 80 mL via INTRAVENOUS

## 2024-01-08 MED ORDER — ARFORMOTEROL TARTRATE 15 MCG/2ML IN NEBU
15.0000 ug | INHALATION_SOLUTION | Freq: Two times a day (BID) | RESPIRATORY_TRACT | Status: DC
  Administered 2024-01-08 – 2024-01-10 (×4): 15 ug via RESPIRATORY_TRACT
  Filled 2024-01-08 (×4): qty 2

## 2024-01-08 MED ORDER — METHYLPREDNISOLONE SODIUM SUCC 125 MG IJ SOLR
125.0000 mg | Freq: Once | INTRAMUSCULAR | Status: AC
  Administered 2024-01-08: 125 mg via INTRAVENOUS
  Filled 2024-01-08: qty 2

## 2024-01-08 NOTE — ED Notes (Signed)
Patient transported to [facility] via [facility] at this time ?

## 2024-01-08 NOTE — Hospital Course (Signed)
 Julia Hernandez is a 88 y.o. female with medical history significant for COPD, chronic HFpEF (EF 55 to 60%), CAD s/p DES to LAD (11/2018), CKD stage IIIa, HTN, HLD who is admitted with acute hypoxic respiratory failure due to COPD exacerbation.

## 2024-01-08 NOTE — ED Provider Notes (Signed)
 Care assumed from previous provider.  See note for full HPI.  88 year old here for evaluation of shortness of breath.  Found to be hypoxic with EMS.  No home oxygen.  Was given breathing treatments with EMS.  Patient that further breathing treatments, steroids and mag here.  Her chest x-ray and ultrasound of right upper quadrant did not show any significant findings..  Provider ordered CTA of her chest as well as CT abdomen pelvis.  Plan to reassess  No recent falls or injuries.  No history of PE or DVT    Physical Exam  BP (!) 160/62   Pulse 86   Temp 98.6 F (37 C) (Oral)   Resp (!) 24   Ht 4\' 8"  (1.422 m)   Wt 41.7 kg   SpO2 94%   BMI 20.63 kg/m   Physical Exam Vitals and nursing note reviewed.  Constitutional:      General: She is not in acute distress.    Appearance: She is well-developed. She is not ill-appearing or diaphoretic.     Comments: Thin, cachectic  HENT:     Head: Atraumatic.     Ears:     Comments: Cerumen impaction bilaterally    Nose: Nose normal.  Eyes:     Pupils: Pupils are equal, round, and reactive to light.  Cardiovascular:     Rate and Rhythm: Normal rate and regular rhythm.     Pulses: Normal pulses.     Heart sounds: Normal heart sounds.  Pulmonary:     Effort: Pulmonary effort is normal. No respiratory distress.     Breath sounds: Decreased breath sounds and wheezing present.  Chest:     Comments: Nontender chest wall, no crepitus Abdominal:     General: There is no distension.     Palpations: Abdomen is soft.  Musculoskeletal:        General: Normal range of motion.     Cervical back: Normal range of motion and neck supple.  Skin:    General: Skin is warm and dry.  Neurological:     General: No focal deficit present.     Mental Status: She is alert and oriented to person, place, and time.     Procedures  .Critical Care  Performed by: Linwood Dibbles, PA-C Authorized by: Linwood Dibbles, PA-C   Critical care provider  statement:    Critical care time (minutes):  35   Critical care was necessary to treat or prevent imminent or life-threatening deterioration of the following conditions:  Respiratory failure   Critical care was time spent personally by me on the following activities:  Development of treatment plan with patient or surrogate, discussions with consultants, evaluation of patient's response to treatment, examination of patient, ordering and review of laboratory studies, ordering and review of radiographic studies, ordering and performing treatments and interventions, pulse oximetry, re-evaluation of patient's condition and review of old charts Ear Cerumen Removal  Date/Time: 01/08/2024 8:06 PM  Performed by: Linwood Dibbles, PA-C Authorized by: Linwood Dibbles, PA-C   Consent:    Consent obtained:  Verbal   Consent given by:  Patient   Risks, benefits, and alternatives were discussed: yes     Risks discussed:  Bleeding, infection, pain, TM perforation, incomplete removal and dizziness   Alternatives discussed:  No treatment, delayed treatment, alternative treatment, observation and referral Universal protocol:    Procedure explained and questions answered to patient or proxy's satisfaction: yes     Relevant documents present and  verified: yes     Test results available: yes     Imaging studies available: yes     Required blood products, implants, devices, and special equipment available: yes     Site/side marked: yes     Immediately prior to procedure, a time out was called: yes     Patient identity confirmed:  Verbally with patient Procedure details:    Location:  L ear and R ear   Procedure type: irrigation     Procedure outcomes: cerumen removed   Post-procedure details:    Inspection:  No bleeding, some cerumen remaining and TM intact   Hearing quality:  Improved   Procedure completion:  Tolerated well, no immediate complications   ED Course / MDM   Clinical Course as of  01/08/24 2011  Sat Jan 08, 2024  1831 Worsening SOB and epigastric pain. 72% on RA does not wear oxygen at home. Duoneb with EMS, got mag, solumedrol, nebs [BH]  1946 Dr. Janalyn Shy with medicine  [BH]    Clinical Course User Index [BH] Athaliah Baumbach A, PA-C   Care assumed from previous provider.  See note for full HPI.  88 year old history of COPD, CHF here for evaluation of shortness of breath.  Found to be hypoxic into the 70s at home into the 80s with EMS.  She does not wear chronic oxygen.  She also notes she has been having some epigastric pain.  History of reflux.  Sounds like her shortness of breath has been worsening over the last month.  She states she has been using her home Symbicort inhaler and another inhaler which she does not know the name of.  No chest pain.  Cough productive of yellow and green sputum.  Was given DuoNeb, mag, Solu-Medrol with previous provider.  Also got DuoNeb x 2 and with EMS.  Patient has improvement of breath sounds with treatment here.  Still has some mild expiratory wheeze.  Imaging without significant abnormality.  Suspect gastritis as cause of her abdominal pain as well as COPD exacerbation causing her shortness of breath..  She is requiring 2 L of oxygen here.  Will admit for further management.   Question whether some of her shortness of breath is chronic.  She still smokes tobacco.  She does not appear grossly fluid overloaded to suspect CHF exacerbation.  No PE on imaging.  Occluded ear canals bilaterally with wax.  Irrigated, cerumen removed bilaterally.  Discussed results with patient, neighbor at bedside.  Will admit for further management and workup.  Discussed with Dr. Janalyn Shy with medicine who is agreeable to evaluate patient for admission  The patient appears reasonably stabilized for admission considering the current resources, flow, and capabilities available in the ED at this time, and I doubt any other [facility] requiring further screening  and/or treatment in the ED prior to admission.     Medical Decision Making Amount and/or Complexity of Data Reviewed Labs: ordered. Radiology: ordered.  Risk OTC drugs. Prescription drug management. Decision regarding hospitalization.   Hypoxic respiratory failure COPD exacerbation Cerumen impaction Gastritis       Margie Urbanowicz A, PA-C 01/08/24 2011    Anders Simmonds T, DO 01/09/24 1545

## 2024-01-08 NOTE — H&P (Signed)
 History and Physical    Julia Hernandez HQI:[id number] DOB: Dec 02, 1935 DOA: 01/08/2024  PCP: Sigmund Hazel, MD  Patient coming from: Home  I have personally briefly reviewed patient's old medical records in [facility]  Chief Complaint: Shortness of breath  HPI: Julia Hernandez is a 88 y.o. female with medical history significant for COPD, chronic HFpEF (EF 55 to 60%), CAD s/p DES to LAD (11/2018), CKD stage IIIa, HTN, HLD, tobacco use who presented to the ED for evaluation of shortness of breath.  Patient states that she has had intermittent episodes of increased shortness of breath on and off over the last 2 weeks.  This worsened today.  She was concerned that she might of had fluid on her lungs and she took her as needed Lasix with only minimal relief.  She has not had much improvement with her home inhalers.  She reports new nonproductive cough with chest congestion today as well.  She also notes some upper abdominal discomfort with nausea but no emesis.  Patient reports continued tobacco use, she smokes 3-4 cigarettes/day.  She says she does not require supplemental oxygen at baseline.  She lives alone.  [facility] ED Course  Labs/Imaging on admission: I have personally reviewed following labs and imaging studies.  Initial vitals showed BP 187/83, pulse 84, RR 24, temp 98.6 F, SpO2 83% on room air.  Patient placed on 2 L O2 via [place] with improvement to 97%.  Labs showed WBC 8.6, hemoglobin 14.2, platelets 196,000, sodium 143, potassium 3.6, bicarb 37, BUN 13, creatinine 0.97, serum glucose 117, LFTs within normal limits, lipase 20, troponin 15, BNP 205.7.  RUQ ultrasound showed small amount of sludge versus nonshadowing stones in the gallbladder.  No sonographic evidence of acute cholecystitis.  CTA chest negative for evidence of PE.  Moderate CAD noted.  No acute cardiopulmonary disease.  Chronic compression fractures in the mid and lower thoracic spine and upper lumbar spine  noted.  CT abdomen/pelvis with contrast showed stomach is under distended and mild diffuse gastric wall thickening is not excluded.  Nonobstructing left renal calculi.  Stable simple appearing cystic area in the right adnexa measuring 2.0 cm.  Sigmoid colon diverticulosis also noted.  Patient was given IV Solu-Medrol 125 mg, IV ceftriaxone and azithromycin, IV magnesium 2 g, DuoNeb treatment.  The hospitalist service was consulted to admit.  Review of Systems: All systems reviewed and are negative except as documented in history of present illness above.   Past Medical History:  Diagnosis Date   Asthma    COPD (chronic obstructive pulmonary disease) (HCC)    Hypertension     Past Surgical History:  Procedure Laterality Date   BALLOON DILATION N/A 03/24/2022   Procedure: BALLOON DILATION;  Surgeon: Kerin Salen, MD;  Location: [facility];  Service: Gastroenterology;  Laterality: N/A;   BALLOON DILATION N/A 09/03/2023   Procedure: BALLOON DILATION;  Surgeon: Kerin Salen, MD;  Location: [facility];  Service: Gastroenterology;  Laterality: N/A;   BOTOX INJECTION  09/03/2023   Procedure: BOTOX INJECTION;  Surgeon: Kerin Salen, MD;  Location: [facility];  Service: Gastroenterology;;   ESOPHAGOGASTRODUODENOSCOPY N/A 03/24/2022   Procedure: ESOPHAGOGASTRODUODENOSCOPY (EGD);  Surgeon: Kerin Salen, MD;  Location: [facility];  Service: Gastroenterology;  Laterality: N/A;   ESOPHAGOGASTRODUODENOSCOPY (EGD) WITH PROPOFOL N/A 09/03/2023   Procedure: ESOPHAGOGASTRODUODENOSCOPY (EGD) WITH PROPOFOL;  Surgeon: Kerin Salen, MD;  Location: [facility];  Service: Gastroenterology;  Laterality: N/A;    Social History: Patient reports smoking 3-4 cigarettes daily.  Allergies  Allergen Reactions   Influenza Vac Split Quad Shortness Of Breath   Ditropan Xl [Oxybutynin] Other (See Comments)    Unknown reaction   Simvastatin Rash    History reviewed. No pertinent family history.   Prior to  Admission medications   Medication Sig Start Date End Date Taking? Authorizing Provider  acetaminophen (TYLENOL) 500 MG tablet Take 500 mg by mouth 5 (five) times daily.    [provider]  acyclovir ointment (ZOVIRAX) 5 % Apply 1 application topically 3 (three) times daily as needed (outbreaks).    [provider]  aspirin EC 81 MG tablet Take 1 tablet (81 mg total) by mouth daily. Swallow whole. 03/31/22   Rolly Salter, MD  B Complex-C (B-COMPLEX WITH VITAMIN C) tablet Take 1 tablet by mouth daily. 03/31/22   Rolly Salter, MD  diazepam (VALIUM) 5 MG tablet Take 1 tablet (5 mg total) by mouth daily as needed for anxiety. Patient taking differently: Take 2.5 mg by mouth daily as needed for anxiety. 03/30/22   Rolly Salter, MD  ferrous sulfate 325 (65 FE) MG tablet Take 1 tablet (325 mg total) by mouth daily with breakfast. 09/04/23   Rhetta Mura, MD  furosemide (LASIX) 20 MG tablet Take 1 tablet (20 mg total) by mouth daily as needed (weight gain of >3 lbs in 1 day or >5 lbs in 1 week). 03/30/22   Rolly Salter, MD  guaiFENesin (MUCINEX) 600 MG 12 hr tablet Take 1 tablet (600 mg total) by mouth 2 (two) times daily. 09/04/23   Rhetta Mura, MD  Ipratropium-Albuterol (COMBIVENT RESPIMAT) 20-100 MCG/ACT AERS respimat Inhale 1 puff into the lungs 4 (four) times daily as needed for shortness of breath.    [provider]  losartan (COZAAR) 25 MG tablet Take 0.5 tablets (12.5 mg total) by mouth daily. 09/24/23   Andrey Farmer, PA-C  magic mouthwash w/lidocaine SOLN Take 5 mLs by mouth 4 (four) times daily as needed for mouth pain.    [provider]  metoprolol succinate (TOPROL-XL) 50 MG 24 hr tablet Take 1 tablet (50 mg total) by mouth at bedtime. 09/24/23   Andrey Farmer, PA-C  oxyCODONE (OXY IR/ROXICODONE) 5 MG immediate release tablet Take 0.5 tablets (2.5 mg total) by mouth every 4 (four) hours as needed for moderate  pain. Patient not taking: Reported on 09/24/2023 03/30/22   Rolly Salter, MD  pantoprazole (PROTONIX) 40 MG tablet Take 1 tablet (40 mg total) by mouth daily. Patient taking differently: Take 40 mg by mouth daily as needed (for acid reflux). 03/31/22   Rolly Salter, MD  sertraline (ZOLOFT) 100 MG tablet Take 100 mg by mouth at bedtime.    [provider]  spironolactone (ALDACTONE) 25 MG tablet Take 1 tablet (25 mg total) by mouth daily. 09/24/23   Andrey Farmer, PA-C  SYMBICORT 160-4.5 MCG/ACT inhaler Inhale 2 puffs into the lungs 2 (two) times daily. 03/02/22   [provider]    Physical Exam: Vitals:   01/08/24 1830 01/08/24 2015 01/08/24 2020 01/08/24 2100  BP: (!) 160/62 130/77  138/89  Pulse: 86 89 93 92  Resp: (!) 24 (!) 21 (!) 23 20  Temp:  98.6 F (37 C)  98.2 F (36.8 C)  TempSrc:  Oral  Oral  SpO2: 94% 94% 94% 94%  Weight:      Height:       Constitutional: Thin elderly woman resting in bed, NAD, calm,  comfortable Eyes: EOMI, lids and conjunctivae normal ENMT: Mucous membranes are moist. Posterior pharynx clear of any exudate or lesions.Normal dentition.  Neck: normal, supple, no masses. Respiratory: Distant breath sounds. Normal respiratory effort while on 2 L O2 via [place]. No accessory muscle use.  Cardiovascular: Regular rate and rhythm, no murmurs / rubs / gallops. No extremity edema. 2+ pedal pulses. Abdomen: no tenderness, no masses palpated. Musculoskeletal: no clubbing / cyanosis. No joint deformity upper and lower extremities. Good ROM, no contractures. Normal muscle tone.  Skin: no rashes, lesions, ulcers. No induration Neurologic: Sensation intact. Strength 5/5 in all 4.  Psychiatric: Normal judgment and insight. Alert and oriented x 3. Normal mood.   EKG: Personally reviewed. Sinus rhythm, rate 84, incomplete RBBB, no acute ischemic changes.  Assessment/Plan Principal Problem:   Acute respiratory failure with hypoxia  (HCC) Active Problems:   COPD with acute exacerbation (HCC)   Chronic diastolic CHF (congestive heart failure) (HCC)   Stage 3a chronic kidney disease (CKD) (HCC)   Hyperlipidemia   Atherosclerotic heart disease of native coronary artery without angina pectoris   Essential hypertension   Julia Hernandez is a 88 y.o. female with medical history significant for COPD, chronic HFpEF (EF 55 to 60%), CAD s/p DES to LAD (11/2018), CKD stage IIIa, HTN, HLD who is admitted with acute hypoxic respiratory failure due to COPD exacerbation.  Assessment and Plan: Acute hypoxic respiratory failure due to COPD exacerbation: SpO2 83% on room air on arrival to the ED, currently improved on 2 L O2 via [place].  She received steroids, antibiotics, and nebulizer treatment for COPD exacerbation.  She reports improving symptoms. -Placed on scheduled Brovana/Pulmicort with DuoNebs as needed -Continue prednisone 40 mg daily -Continue supplemental oxygen and wean off as able  Gastritis: Patient with intermittent epigastric pain and nausea without emesis.  CT with possible gastritis.  She has been started on oral Protonix.  Cholelithiasis: Patient reported RUQ abdominal pain in the ED.  RUQ ultrasound showed small amount of sludge versus nonshadowing stones.  She does not have any RUQ pain at time of admitting exam.  Doubt acute cholecystitis.  Chronic HFpEF: Chronic and stable.  Last EF 55-60%.  Continue Toprol-XL 50 mg daily, spironolactone 25 mg daily, losartan 12.5 mg daily and home oral Lasix 20 mg as needed.  CAD s/p DES to LAD February 2020: Stable.  Continue aspirin and Toprol-XL.  Not currently on statin.  Hypertension: Continue losartan, Toprol-XL, spironolactone.  CKD stage IIIa: Renal function is stable.  Hyperlipidemia: Not currently on statin.  Tobacco use: Patient reports smoking 3-4 cigarettes daily.  Smoking cessation advised.  She declines nicotine patch.  Anxiety: Continue sertraline and  diazepam 2.5 mg daily as needed for anxiety.   DVT prophylaxis: heparin injection 5,000 Units Start: 01/08/24 2200 Code Status:   Code Status: Limited: Do not attempt resuscitation (DNR) -DNR-LIMITED -Do Not Intubate/DNI confirmed with patient on admission. Family Communication: Discussed with patient, she has discussed with family Disposition Plan: From home and likely discharge to home pending clinical progress Consults called: None Severity of Illness: The appropriate patient status for this patient is INPATIENT. Inpatient status is judged to be reasonable and necessary in order to provide the required intensity of service to ensure the patient's safety. The patient's presenting symptoms, physical exam findings, and initial radiographic and laboratory data in the context of their chronic comorbidities is felt to place them at high risk for further clinical deterioration. Furthermore, it is not anticipated that the patient will  be medically stable for discharge from the hospital within 2 midnights of admission.   * I certify that at the point of admission it is my clinical judgment that the patient will require inpatient hospital care spanning beyond 2 midnights from the point of admission due to high intensity of service, high risk for further deterioration and high frequency of surveillance required.Darreld Mclean MD [facility]  If 7PM-7AM, please contact night-coverage [email or web address]  01/08/2024, 9:34 PM

## 2024-01-08 NOTE — Plan of Care (Signed)
 Plan of Care Note for accepted transfer  Patient: Julia Hernandez              WUJ:[id number]  DOA: 01/08/2024     Facility requesting transfer: [facility]  emergency department Requesting Provider: Linwood Dibbles, PA-C    Reason for transfer: Acute hypoxic respiratory failure in the setting of COPD exacerbation, epigastric and right upper quadrant abdominal pain in setting of gastritis and cholelithiasis.  ED triage note:  Present by EMS for generalized weakness; short of breath. And mid abdominal pain.  Present with labored breathing; diminished breath sounds.  Hx of COPD.  Initial O2 sats 83%.  Placed on 2 L O2 which brough O2 sats up to low 90's      Facility course: 88 year old female history of COPD, heart failure with preserved ejection fraction, CKD stage IIIa, CAD status post angioplasty and graft placement, essential hypertension, normocytic anemia, generalized anxiety disorder, presented to emergency department complaining of shortness of breath and epigastric pain as well as right upper quadrant abdominal pain.Patient denies any nausea vomiting or fevers but states she is coughing up yellow sputum. Patient is on normally on oxygen however was found to be at low 70% with EMS and placed on 2 L in which her oxygen came up to the low 90s. Patient was given 1 breathing treatment. Patient is able speak in full sentences but states that she feels that her COPD is acting up. Patient states symptoms have been going on since the beginning of the month.    At presentation to ED patient O2 sat dropped to 83% which is improved to 94 currently on 2 L oxygen.  Initial blood pressure has been elevated which has been improved to 160/62. CBC unremarkable. BNP around 205.  Normal troponin level. Respiratory panel negative for COVID, flu RSV. CMP unremarkable except elevated bicarb 37. Normal lipase level.  EKG showing   CT abdomen pelvis: Concern for gastritis. 1. The stomach is under distended  and mild diffuse gastric wall thickening is not excluded. Correlate clinically for gastritis. 2. Nonobstructing left renal calculi. 3. Stable simple appearing cystic area in the right adnexa measuring 2.0 cm. No follow-up imaging recommended. 4. Sigmoid colon diverticulosis. 5. Aortic atherosclerosis.  CTA chest no evidence of PE. 1. The stomach is under distended and mild diffuse gastric wall thickening is not excluded. Correlate clinically for gastritis. 2. Nonobstructing left renal calculi. 3. Stable simple appearing cystic area in the right adnexa measuring 2.0 cm. No follow-up imaging recommended. 4. Sigmoid colon diverticulosis. 5. Aortic atherosclerosis.   Right upper quadrant ultrasound: Small amount of sludge versus nonshadowing stones in the gallbladder. No sonographic evidence of acute cholecystitis.  Chest x-ray no active disease process.  In the ED patient has been treated with DuoNeb nebulizer, mag sulfate, methylprednisolone, ceftriaxone azithromycin.  Hospitalist has been consulted for further evaluation management of acute hypoxic respiratory failure in the setting of COPD exacerbation, epigastric and right upper quadrant abdominal pain in setting of gastritis and cholelithiasis.  Plan of care: The patient is accepted for admission for inpatient/observation status to Telemetry unit, at [facility].  [facility] will assume care on arrival to accepting facility. Until arrival, care as per EDP. However, [facility] available 24/7 for questions and assistance.   Check [email or web address] for on-call coverage.   Nursing staff, please call [facility] Admits & Consults System-Wide number under Amion on patient's arrival so appropriate admitting provider can evaluate the pt.    Author: Tereasa Coop, MD  01/08/2024  [facility]

## 2024-01-08 NOTE — ED Provider Notes (Signed)
 [place] EMERGENCY DEPARTMENT AT [facility] Provider Note   CSN: [id number] Arrival date & time: 01/08/24  1723     History  Chief Complaint  Patient presents with   Shortness of Breath    Julia Hernandez is a 88 y.o. female history of CHF, COPD, DNR, tobacco use, nonrheumatic mitral valve regurg presented for shortness of breath.  Patient also does endorse epigastric pain as well and right upper quadrant pain states she still is her gallbladder.  Patient denies any nausea vomiting or fevers but states she is coughing up yellow sputum.  Patient is on normally on oxygen however was found to be at 83% with EMS and placed on 2 L in which her oxygen came up to the low 90s.  Patient was given 1 breathing treatment.  Patient is able speak in full sentences but states that she feels that her COPD is acting up.  Patient states symptoms have been going on since the beginning of the month.  Home Medications Prior to Admission medications   Medication Sig Start Date End Date Taking? Authorizing Provider  acetaminophen (TYLENOL) 500 MG tablet Take 500 mg by mouth 5 (five) times daily.    [provider]  acyclovir ointment (ZOVIRAX) 5 % Apply 1 application topically 3 (three) times daily as needed (outbreaks).    [provider]  aspirin EC 81 MG tablet Take 1 tablet (81 mg total) by mouth daily. Swallow whole. 03/31/22   Rolly Salter, MD  B Complex-C (B-COMPLEX WITH VITAMIN C) tablet Take 1 tablet by mouth daily. 03/31/22   Rolly Salter, MD  diazepam (VALIUM) 5 MG tablet Take 1 tablet (5 mg total) by mouth daily as needed for anxiety. Patient taking differently: Take 2.5 mg by mouth daily as needed for anxiety. 03/30/22   Rolly Salter, MD  ferrous sulfate 325 (65 FE) MG tablet Take 1 tablet (325 mg total) by mouth daily with breakfast. 09/04/23   Rhetta Mura, MD  furosemide (LASIX) 20 MG tablet Take 1 tablet (20 mg total) by mouth daily as needed (weight gain  of >3 lbs in 1 day or >5 lbs in 1 week). 03/30/22   Rolly Salter, MD  guaiFENesin (MUCINEX) 600 MG 12 hr tablet Take 1 tablet (600 mg total) by mouth 2 (two) times daily. 09/04/23   Rhetta Mura, MD  Ipratropium-Albuterol (COMBIVENT RESPIMAT) 20-100 MCG/ACT AERS respimat Inhale 1 puff into the lungs 4 (four) times daily as needed for shortness of breath.    [provider]  losartan (COZAAR) 25 MG tablet Take 0.5 tablets (12.5 mg total) by mouth daily. 09/24/23   Andrey Farmer, PA-C  magic mouthwash w/lidocaine SOLN Take 5 mLs by mouth 4 (four) times daily as needed for mouth pain.    [provider]  metoprolol succinate (TOPROL-XL) 50 MG 24 hr tablet Take 1 tablet (50 mg total) by mouth at bedtime. 09/24/23   Andrey Farmer, PA-C  oxyCODONE (OXY IR/ROXICODONE) 5 MG immediate release tablet Take 0.5 tablets (2.5 mg total) by mouth every 4 (four) hours as needed for moderate pain. Patient not taking: Reported on 09/24/2023 03/30/22   Rolly Salter, MD  pantoprazole (PROTONIX) 40 MG tablet Take 1 tablet (40 mg total) by mouth daily. Patient taking differently: Take 40 mg by mouth daily as needed (for acid reflux). 03/31/22   Rolly Salter, MD  sertraline (ZOLOFT) 100 MG tablet Take 100 mg by mouth at bedtime.  [provider]  spironolactone (ALDACTONE) 25 MG tablet Take 1 tablet (25 mg total) by mouth daily. 09/24/23   Andrey Farmer, PA-C  SYMBICORT 160-4.5 MCG/ACT inhaler Inhale 2 puffs into the lungs 2 (two) times daily. 03/02/22   [provider]      Allergies    Influenza vac split quad, Ditropan xl [oxybutynin], and Simvastatin    Review of Systems   Review of Systems  Respiratory:  Positive for shortness of breath.     Physical Exam Updated Vital Signs BP (!) 165/128   Pulse 84   Temp 98.6 F (37 C) (Oral)   Resp (!) 26   Ht 4\' 8"  (1.422 m)   Wt 41.7 kg   SpO2 97%   BMI 20.63 kg/m  Physical  Exam Constitutional:      General: She is in acute distress.  Cardiovascular:     Rate and Rhythm: Normal rate and regular rhythm.     Pulses: Normal pulses.     Heart sounds: Normal heart sounds.  Pulmonary:     Effort: Tachypnea and respiratory distress present.     Breath sounds: Decreased breath sounds present.     Comments: Able to speak in full sentences On 2 L nasal cannula which is abnormal for the patient as she is normally on room air Abdominal:     Palpations: Abdomen is soft.     Tenderness: There is abdominal tenderness. There is rebound. There is no guarding.  Musculoskeletal:     Right lower leg: No tenderness. No edema.     Left lower leg: No tenderness. No edema.  Skin:    General: Skin is warm and dry.     Capillary Refill: Capillary refill takes less than 2 seconds.  Neurological:     Mental Status: She is alert.  Psychiatric:        Mood and Affect: Mood normal.     ED Results / Procedures / Treatments   Labs (all labs ordered are listed, but only abnormal results are displayed) Labs Reviewed  BRAIN NATRIURETIC PEPTIDE - Abnormal; Notable for the following components:      Result Value   B Natriuretic Peptide 205.7 (*)    All other components within normal limits  COMPREHENSIVE METABOLIC PANEL WITH GFR - Abnormal; Notable for the following components:   CO2 37 (*)    Glucose, Bld 117 (*)    GFR, Estimated 57 (*)    All other components within normal limits  RESP PANEL BY RT-PCR (RSV, FLU A&B, COVID)  [id number]  CBC WITH DIFFERENTIAL/PLATELET  LIPASE, BLOOD  URINALYSIS, ROUTINE W REFLEX MICROSCOPIC  TROPONIN I (HIGH SENSITIVITY)    EKG None  Radiology DG Chest Port 1 View Result Date: 01/08/2024 CLINICAL DATA:  Shortness of breath. EXAM: PORTABLE CHEST 1 VIEW COMPARISON:  September 02, 2023. FINDINGS: The heart size and mediastinal contours are within normal limits. Both lungs are clear. The visualized skeletal structures are unremarkable.  IMPRESSION: No active disease. Electronically Signed   By: Lupita Raider M.D.   On: 01/08/2024 17:59    Procedures .Critical Care  Performed by: Netta Corrigan, PA-C Authorized by: Netta Corrigan, PA-C   Critical care provider statement:    Critical care time (minutes):  30   Critical care time was exclusive of:  Separately billable procedures and treating other patients   Critical care was necessary to treat or prevent imminent or life-threatening deterioration of the following conditions:  Respiratory  failure   Critical care was time spent personally by me on the following activities:  Development of treatment plan with patient or surrogate, blood draw for specimens, evaluation of patient's response to treatment, examination of patient, obtaining history from patient or surrogate, review of old charts, re-evaluation of patient's condition, pulse oximetry, ordering and review of radiographic studies, ordering and review of laboratory studies and ordering and performing treatments and interventions   I assumed direction of critical care for this patient from another provider in my specialty: no     Care discussed with comment:  Oncoming team     Medications Ordered in ED Medications  magnesium sulfate IVPB 2 g 50 mL (2 g Intravenous New Bag/Given 01/08/24 1746)  cefTRIAXone (ROCEPHIN) 1 g in sodium chloride 0.9 % 100 mL IVPB (has no administration in time range)  azithromycin (ZITHROMAX) 500 mg in sodium chloride 0.9 % 250 mL IVPB (has no administration in time range)  ipratropium-albuterol (DUONEB) 0.5-2.5 (3) MG/3ML nebulizer solution 6 mL (6 mLs Nebulization Given 01/08/24 1750)  methylPREDNISolone sodium succinate (SOLU-MEDROL) 125 mg/2 mL injection 125 mg (125 mg Intravenous Given 01/08/24 1742)    ED Course/ Medical Decision Making/ A&P Clinical Course as of 01/08/24 1833  Sat Jan 08, 2024  1831 Worsening SOB and epigastric pain. 72% on RA does not wear oxygen at home. Duoneb  with EMS, got mag, solumedrol, nebs [BH]    Clinical Course User Index [BH] Henderly, Britni A, PA-C                                 Medical Decision Making Amount and/or Complexity of Data Reviewed Labs: ordered. Radiology: ordered.  Risk Prescription drug management.   Ladeja Pelham 88 y.o. presented today for shortness of breath.  Working DDx that I considered at this time includes, but not limited to, asthma/COPD exacerbation, URI, viral illness, anemia, ACS, PE, pneumonia, pleural effusion, lung cancer, CHF, respiratory distress, medication side effect, intoxication.  R/o DDx: Pending  Review of prior external notes: 09/24/2023 progress Notes  Unique Tests and My Independent Interpretation:  CBC: Unremarkable CMP: Mild hypercapnia 37 Lipase: Unremarkable BNP: 205.7, lower than patient's baseline Right upper quadrant ultrasound: No acute pathology UA: pending EKG: Sinus 84 bpm, right bundle branch block present, artifact is present and makes it difficult to interpret however no obvious signs of ischemia or right heart strain Troponin: 15 CXR: No acute pathology CTA Chest PE: pending CT Ab/pelvis w contrast: pending  Social Determinants of Health: none  Discussion with Independent Historian:  EMS, Friend  Discussion of Management of Tests: None  Risk: High: hospitalization or escalation of hospital-level care  Risk Stratification Score: None  Plan: On exam patient was in no acute distress however was noted to be hypoxic upon arrival on room air at 83% and placed on 2 L. Physical exam showed decreased breath sounds bilaterally along with right upper quadrant rebound tenderness. The cardiac monitor was ordered secondary to the patient's history of shortness of breath and to monitor the patient for dysrhythmia. Cardiac monitor by my independent interpretation showed normal sinus.  Patient does not appear fluid overloaded on exam but does have history of heart failure so  we will add on BNP as well.  I do suspect patient may have viral illness or some underlying pathology causing her COPD to be exacerbated so we will give breathing treatment and steroids and mag.  Patient states  she is coughing up yellow sputum and so we will add on antibiotics as well.  Patient states she feels slightly better after breathing treatment here.  Friend in the room does state that when she went to go visit the patient today that her with the SpO2 at the house her oxygen level was 72%.  Symptoms have been going on since the beginning of March in terms of the shortness of breath and abdominal pain but they are unsure of any sick contacts.  There have been no fevers.  At this time give a high suspicion patient is having a viral illness causing her COPD to be exacerbated however with ultrasound not appearing significant and with the rebound tenderness in the epigastric region will get CT imaging of chest and abdomen to further rule any other pathologies out but do anticipate admission.  Patient signed out to [facility], PA-C.  Please review their note for the continuation of patient's care.  The plan at this point is follow-up on imaging and admit.  This chart was dictated using voice recognition software.  Despite best efforts to proofread,  errors can occur which can change the documentation meaning.         Final Clinical Impression(s) / ED Diagnoses Final diagnoses:  COPD with acute exacerbation (HCC)  Acute on chronic respiratory failure with hypoxia and hypercapnia [facility])    Rx / DC Orders ED Discharge Orders     None         Julia Hernandez 01/08/24 1834    Anders Simmonds T, DO 01/09/24 1545

## 2024-01-08 NOTE — ED Triage Notes (Signed)
 Present by EMS for generalized weakness; short of breath. And mid abdominal pain.  Present with labored breathing; diminished breath sounds.  Hx of COPD.  Initial O2 sats 83%.  Placed on 2 L O2 which brough O2 sats up to low 90's

## 2024-01-08 NOTE — ED Notes (Signed)
 Pts friend at bedside ,was with pt before ems called. States pt sats were 74% on room air (neighbor has a  pulse ox). When ems arrived she was given her inhaler and then a breathing treatment per pts friend.   Ems reported to charge on encode that sats were 88 before breathing treatment and 98 after. When pt was placed on stretcher in room 15 her sats were 82 on room air. Oxygen nasal cannula in place and RT at bedside

## 2024-01-08 NOTE — ED Notes (Signed)
 ED Provider at bedside.

## 2024-01-09 DIAGNOSIS — I10 Essential (primary) hypertension: Secondary | ICD-10-CM

## 2024-01-09 DIAGNOSIS — J441 Chronic obstructive pulmonary disease with (acute) exacerbation: Secondary | ICD-10-CM | POA: Diagnosis not present

## 2024-01-09 DIAGNOSIS — I5032 Chronic diastolic (congestive) heart failure: Secondary | ICD-10-CM

## 2024-01-09 DIAGNOSIS — N1831 Chronic kidney disease, stage 3a: Secondary | ICD-10-CM

## 2024-01-09 DIAGNOSIS — J9601 Acute respiratory failure with hypoxia: Secondary | ICD-10-CM | POA: Diagnosis not present

## 2024-01-09 LAB — BASIC METABOLIC PANEL WITH GFR
Anion gap: 9 (ref 5–15)
BUN: 12 mg/dL (ref 8–23)
CO2: 34 mmol/L — ABNORMAL HIGH (ref 22–32)
Calcium: 8.9 mg/dL (ref 8.9–10.3)
Chloride: 101 mmol/L (ref 98–111)
Creatinine, Ser: 0.91 mg/dL (ref 0.44–1.00)
GFR, Estimated: >60 mL/min (ref >60)
Glucose, Bld: 127 mg/dL — ABNORMAL HIGH (ref 70–99)
Potassium: 4.1 mmol/L (ref 3.5–5.1)
Sodium: 144 mmol/L (ref 135–145)

## 2024-01-09 LAB — CBC
HCT: 45.7 % (ref 36.0–46.0)
Hemoglobin: 13.8 g/dL (ref 12.0–15.0)
MCH: 29.2 pg (ref 26.0–34.0)
MCHC: 30.2 g/dL (ref 30.0–36.0)
MCV: 96.6 fL (ref 80.0–100.0)
Platelets: 180 10*3/uL (ref 150–400)
RBC: 4.73 MIL/uL (ref 3.87–5.11)
RDW: 13 % (ref 11.5–15.5)
WBC: 5.9 10*3/uL (ref 4.0–10.5)
nRBC: 0 % (ref 0.0–0.2)

## 2024-01-09 NOTE — Progress Notes (Signed)
 [facility]  PROGRESS NOTE  Kellsey Sansone UJW:[id number] DOB: 07-Jan-1936 DOA: 01/08/2024 PCP: Sigmund Hazel, MD   Brief HPI:   88 y.o. female with medical history significant for COPD, chronic HFpEF (EF 55 to 60%), CAD s/p DES to LAD (11/2018), CKD stage IIIa, HTN, HLD, tobacco use who presented to the ED for evaluation of shortness of breath.   CTA chest negative for evidence of PE.  Moderate CAD noted.  No acute cardiopulmonary disease.  Chronic compression fractures in the mid and lower thoracic spine and upper lumbar spine noted.   CT abdomen/pelvis with contrast showed stomach is under distended and mild diffuse gastric wall thickening is not excluded.  Nonobstructing left renal calculi.  Stable simple appearing cystic area in the right adnexa measuring 2.0 cm.  Sigmoid colon diverticulosis also noted.   Patient was given IV Solu-Medrol 125 mg, IV ceftriaxone and azithromycin, IV magnesium 2 g, DuoNeb treatment.   Assessment/Plan:   Acute hypoxic respiratory failure due to COPD exacerbation: SpO2 83% on room air on arrival to the ED, currently improved on 2 L O2 via [place].  She received steroids, antibiotics, and nebulizer treatment for COPD exacerbation.  She reports improving symptoms. -Placed on scheduled Brovana/Pulmicort with DuoNebs as needed -Continue prednisone 40 mg daily -Continue supplemental oxygen and wean off as able   Gastritis: Patient with intermittent epigastric pain and nausea without emesis.   CT with possible gastritis.   She has been started on oral Protonix.   Cholelithiasis: Patient reported RUQ abdominal pain in the ED.  RUQ ultrasound showed small amount of sludge versus nonshadowing stones.   She does not have any RUQ pain at time of admitting exam.  Doubt acute cholecystitis.   Chronic HFpEF: Chronic and stable.  Last EF 55-60%.   Continue Toprol-XL 50 mg daily, spironolactone 25 mg daily, losartan 12.5 mg daily and home oral Lasix 20 mg as needed.    CAD s/p DES to LAD February 2020: Stable.  Continue aspirin and Toprol-XL.  Not currently on statin.   Hypertension: Continue losartan, Toprol-XL, spironolactone.   CKD stage IIIa: Renal function is stable.   Hyperlipidemia: Not currently on statin.   Tobacco use: Patient reports smoking 3-4 cigarettes daily.  Smoking cessation advised.   She declines nicotine patch.   Anxiety: Continue sertraline and diazepam 2.5 mg daily as needed for anxiety.    Medications     arformoterol  15 mcg Nebulization BID   aspirin EC  81 mg Oral Daily   budesonide (PULMICORT) nebulizer solution  0.25 mg Nebulization BID   guaiFENesin  600 mg Oral BID   heparin  5,000 Units Subcutaneous Q8H   losartan  12.5 mg Oral Daily   metoprolol succinate  50 mg Oral QHS   pantoprazole  40 mg Oral Daily   predniSONE  40 mg Oral Q breakfast   sertraline  100 mg Oral QHS   sodium chloride flush  3 mL Intravenous Q12H   spironolactone  25 mg Oral Daily     Data Reviewed:   CBG:  No results for input(s): "GLUCAP" in the last 168 hours.  SpO2: 96 % O2 Flow Rate (L/min): 2 L/min    Vitals:   01/08/24 2221 01/09/24 0100 01/09/24 0418 01/09/24 0539  BP:  (!) 135/54  (!) 112/53  Pulse:  68  62  Resp:  18  18  Temp:  98.4 F (36.9 C)  97.6 F (36.4 C)  TempSrc:  Oral  Oral  SpO2:  92% 95%  96%  Weight:   39.9 kg   Height:          Data Reviewed:  Basic Metabolic Panel: Recent Labs  Lab 01/08/24 1735 01/09/24 0546  NA 143 144  K 3.6 4.1  CL 98 101  CO2 37* 34*  GLUCOSE 117* 127*  BUN 13 12  CREATININE 0.97 0.91  CALCIUM 9.5 8.9    CBC: Recent Labs  Lab 01/08/24 1735 01/09/24 0546  WBC 8.6 5.9  NEUTROABS 6.3  --   HGB 14.2 13.8  HCT 46.0 45.7  MCV 94.1 96.6  PLT 196 180    LFT Recent Labs  Lab 01/08/24 1735  AST 16  ALT 7  ALKPHOS 84  BILITOT 0.4  PROT 7.2  ALBUMIN 4.1     Antibiotics: Anti-infectives (From admission, onward)    Start     Dose/Rate  Route Frequency Ordered Stop   01/08/24 1815  cefTRIAXone (ROCEPHIN) 1 g in sodium chloride 0.9 % 100 mL IVPB        1 g 200 mL/hr over 30 Minutes Intravenous  Once 01/08/24 1803 01/08/24 1922   01/08/24 1815  azithromycin (ZITHROMAX) 500 mg in sodium chloride 0.9 % 250 mL IVPB        500 mg 250 mL/hr over 60 Minutes Intravenous  Once 01/08/24 1803 01/08/24 2052        DVT prophylaxis: Heparin  Code Status: DNR  Family Communication: No family at bedside   CONSULTS    Subjective   Breathing has improved   Objective    Physical Examination:   General-appears in no acute distress Heart-S1-S2, regular, no murmur auscultated Lungs-clear to auscultation bilaterally, no wheezing or crackles auscultated Abdomen-soft, nontender, no organomegaly Extremities-no edema in the lower extremities Neuro-alert, oriented x3, no focal deficit noted   Status is: Inpatient:             Meredeth Ide   [facility] If 7PM-7AM, please contact night-coverage at [email or web address], Office  [phone]   01/09/2024, 8:51 AM  LOS: 1 day

## 2024-01-09 NOTE — Progress Notes (Signed)
 Mobility Specialist - Progress Note  (NC2L) Pre-mobility: 71 bpm HR, 96% SpO2 During mobility: 78 bpm HR, 94% SpO2   01/09/24 0922  Mobility  Activity Ambulated with assistance in hallway  Level of Assistance Contact guard assist, steadying assist  Assistive Device Front wheel walker;Other (Comment) (HHA)  Distance Ambulated (ft) 100 ft  Range of Motion/Exercises Active  Activity Response Tolerated well  Mobility Referral Yes  Mobility visit 1 Mobility  Mobility Specialist Start Time (ACUTE ONLY) 0900  Mobility Specialist Stop Time (ACUTE ONLY) [id number]  Mobility Specialist Time Calculation (min) (ACUTE ONLY) 22 min   Pt was found in bed and agreeable to ambulate. Grew fatigued with session and was a little unsteady getting out of bed. At EOS stated needing to use bathroom. Understood to pull call bell when finished. RN and NT notified.  Billey Chang Mobility Specialist

## 2024-01-10 DIAGNOSIS — J9601 Acute respiratory failure with hypoxia: Secondary | ICD-10-CM | POA: Diagnosis not present

## 2024-01-10 DIAGNOSIS — J441 Chronic obstructive pulmonary disease with (acute) exacerbation: Secondary | ICD-10-CM | POA: Diagnosis not present

## 2024-01-10 DIAGNOSIS — I5032 Chronic diastolic (congestive) heart failure: Secondary | ICD-10-CM | POA: Diagnosis not present

## 2024-01-10 DIAGNOSIS — I10 Essential (primary) hypertension: Secondary | ICD-10-CM | POA: Diagnosis not present

## 2024-01-10 MED ORDER — IPRATROPIUM-ALBUTEROL 0.5-2.5 (3) MG/3ML IN SOLN
3.0000 mL | Freq: Three times a day (TID) | RESPIRATORY_TRACT | Status: DC
  Administered 2024-01-10 – 2024-01-11 (×2): 3 mL via RESPIRATORY_TRACT
  Filled 2024-01-10 (×2): qty 3

## 2024-01-10 MED ORDER — IPRATROPIUM-ALBUTEROL 0.5-2.5 (3) MG/3ML IN SOLN
3.0000 mL | Freq: Four times a day (QID) | RESPIRATORY_TRACT | Status: DC
  Administered 2024-01-10: 3 mL via RESPIRATORY_TRACT
  Filled 2024-01-10: qty 3

## 2024-01-10 MED ORDER — SIMETHICONE 80 MG PO CHEW
80.0000 mg | CHEWABLE_TABLET | Freq: Four times a day (QID) | ORAL | Status: AC
  Administered 2024-01-10 (×4): 80 mg via ORAL
  Filled 2024-01-10 (×4): qty 1

## 2024-01-10 NOTE — Progress Notes (Signed)
 Mobility Specialist - Progress Note  ([place] 2L) Pre-mobility: 73 bpm HR, 93% SpO2 During mobility: 76 bpm HR, 90% SpO2 Post-mobility: 72 bpm HR, 91% SPO2   01/10/24 0948  Mobility  Activity Ambulated with assistance in hallway  Level of Assistance Contact guard assist, steadying assist  Assistive Device Front wheel walker  Distance Ambulated (ft) 110 ft  Range of Motion/Exercises Active  Activity Response Tolerated well  Mobility Referral Yes  Mobility visit 1 Mobility  Mobility Specialist Start Time (ACUTE ONLY) 0930  Mobility Specialist Stop Time (ACUTE ONLY) 0948  Mobility Specialist Time Calculation (min) (ACUTE ONLY) 18 min   Pt was found in bed and agreeable to ambulate. Grew fatigued with session. At EOS returned to recliner chair with all needs met. Call bell in reach.  Billey Chang Mobility Specialist

## 2024-01-10 NOTE — Progress Notes (Addendum)
 [facility]  PROGRESS NOTE  Julia Hernandez ZOX:[id number] DOB: [birth year] DOA: 01/08/2024 PCP: Sigmund Hazel, MD   Brief HPI:   88 y.o. female with medical history significant for COPD, chronic HFpEF (EF 55 to 60%), CAD s/p DES to LAD (11/2018), CKD stage IIIa, HTN, HLD, tobacco use who presented to the ED for evaluation of shortness of breath.   CTA chest negative for evidence of PE.  Moderate CAD noted.  No acute cardiopulmonary disease.  Chronic compression fractures in the mid and lower thoracic spine and upper lumbar spine noted.   CT abdomen/pelvis with contrast showed stomach is under distended and mild diffuse gastric wall thickening is not excluded.  Nonobstructing left renal calculi.  Stable simple appearing cystic area in the right adnexa measuring 2.0 cm.  Sigmoid colon diverticulosis also noted.   Patient was given IV Solu-Medrol 125 mg, IV ceftriaxone and azithromycin, IV magnesium 2 g, DuoNeb treatment.   Assessment/Plan:   Acute hypoxic respiratory failure due to COPD exacerbation: SpO2 83% on room air on arrival to the ED, currently improved on 2 L O2 via [place].  She received steroids, antibiotics, and nebulizer treatment for COPD exacerbation.  She reports improving symptoms. -Placed on scheduled Brovana/Pulmicort with DuoNebs as needed -Will discontinue Brovana, start DuoNebs scheduled every 6 hours.  Continue Pulmicort. -Continue prednisone 40 mg daily -Continue supplemental oxygen and wean off as able; she is not on oxygen at home   Gastritis: Patient with intermittent epigastric pain and nausea without emesis.   CT with possible gastritis.   She has been started on oral Protonix.   Cholelithiasis: Patient reported RUQ abdominal pain in the ED.  RUQ ultrasound showed small amount of sludge versus nonshadowing stones.   She does not have any RUQ pain at time of admitting exam.  Doubt acute cholecystitis.   Chronic HFpEF: Chronic and stable.  Last EF 55-60%.    Continue Toprol-XL 50 mg daily, spironolactone 25 mg daily, losartan 12.5 mg daily and home oral Lasix 20 mg as needed.   CAD s/p DES to LAD February 2020: Stable.  Continue aspirin and Toprol-XL.  Not currently on statin.   Hypertension: Continue losartan, Toprol-XL, spironolactone.   CKD stage IIIa: Renal function is stable.   Hyperlipidemia: Not currently on statin.   Tobacco use: Patient reports smoking 3-4 cigarettes daily.  Smoking cessation advised.   She declines nicotine patch.   Anxiety: Continue sertraline and diazepam 2.5 mg daily as needed for anxiety.    Medications     arformoterol  15 mcg Nebulization BID   aspirin EC  81 mg Oral Daily   budesonide (PULMICORT) nebulizer solution  0.25 mg Nebulization BID   guaiFENesin  600 mg Oral BID   heparin  5,000 Units Subcutaneous Q8H   losartan  12.5 mg Oral Daily   metoprolol succinate  50 mg Oral QHS   pantoprazole  40 mg Oral Daily   predniSONE  40 mg Oral Q breakfast   sertraline  100 mg Oral QHS   sodium chloride flush  3 mL Intravenous Q12H   spironolactone  25 mg Oral Daily     Data Reviewed:   CBG:  No results for input(s): "GLUCAP" in the last 168 hours.  SpO2: 93 % O2 Flow Rate (L/min): 2 L/min    Vitals:   01/09/24 2104 01/10/24 0441 01/10/24 0453 01/10/24 0802  BP: 138/65 137/71    Pulse: 77 67    Resp: 16 18    Temp: 97.8  F (36.6 C) 98.6 F (37 C)    TempSrc: Oral     SpO2: 93% 93%  93%  Weight:   40.1 kg   Height:          Data Reviewed:  Basic Metabolic Panel: Recent Labs  Lab 01/08/24 1735 01/09/24 0546  NA 143 144  K 3.6 4.1  CL 98 101  CO2 37* 34*  GLUCOSE 117* 127*  BUN 13 12  CREATININE 0.97 0.91  CALCIUM 9.5 8.9    CBC: Recent Labs  Lab 01/08/24 1735 01/09/24 0546  WBC 8.6 5.9  NEUTROABS 6.3  --   HGB 14.2 13.8  HCT 46.0 45.7  MCV 94.1 96.6  PLT 196 180    LFT Recent Labs  Lab 01/08/24 1735  AST 16  ALT 7  ALKPHOS 84  BILITOT 0.4   PROT 7.2  ALBUMIN 4.1     Antibiotics: Anti-infectives (From admission, onward)    Start     Dose/Rate Route Frequency Ordered Stop   01/08/24 1815  cefTRIAXone (ROCEPHIN) 1 g in sodium chloride 0.9 % 100 mL IVPB        1 g 200 mL/hr over 30 Minutes Intravenous  Once 01/08/24 1803 01/08/24 1922   01/08/24 1815  azithromycin (ZITHROMAX) 500 mg in sodium chloride 0.9 % 250 mL IVPB        500 mg 250 mL/hr over 60 Minutes Intravenous  Once 01/08/24 1803 01/08/24 2052        DVT prophylaxis: Heparin  Code Status: DNR  Family Communication: No family at bedside   CONSULTS    Subjective   Denies shortness of breath.  Still requiring 2 L pulmonary of oxygen.  Objective    Physical Examination:   General-appears in no acute distress Heart-S1-S2, regular, no murmur auscultated Lungs-decreased breath sounds bilaterally Abdomen-soft, nontender, no organomegaly Extremities-no edema in the lower extremities Neuro-alert, oriented x3, no focal deficit noted   Status is: Inpatient:             Caro Brundidge S Kirin Brandenburger   [facility] If 7PM-7AM, please contact night-coverage at [email or web address], Office  [phone]   01/10/2024, 8:12 AM  LOS: 2 days

## 2024-01-11 DIAGNOSIS — J9601 Acute respiratory failure with hypoxia: Secondary | ICD-10-CM | POA: Diagnosis not present

## 2024-01-11 DIAGNOSIS — I5032 Chronic diastolic (congestive) heart failure: Secondary | ICD-10-CM | POA: Diagnosis not present

## 2024-01-11 DIAGNOSIS — J441 Chronic obstructive pulmonary disease with (acute) exacerbation: Secondary | ICD-10-CM | POA: Diagnosis not present

## 2024-01-11 DIAGNOSIS — I10 Essential (primary) hypertension: Secondary | ICD-10-CM | POA: Diagnosis not present

## 2024-01-11 MED ORDER — IPRATROPIUM-ALBUTEROL 0.5-2.5 (3) MG/3ML IN SOLN
3.0000 mL | Freq: Two times a day (BID) | RESPIRATORY_TRACT | Status: DC
  Administered 2024-01-11 – 2024-01-13 (×3): 3 mL via RESPIRATORY_TRACT
  Filled 2024-01-11 (×4): qty 3

## 2024-01-11 MED ORDER — FUROSEMIDE 10 MG/ML IJ SOLN
20.0000 mg | Freq: Once | INTRAMUSCULAR | Status: AC
  Administered 2024-01-11: 20 mg via INTRAVENOUS
  Filled 2024-01-11: qty 2

## 2024-01-11 NOTE — Progress Notes (Signed)
   01/11/24 0844  TOC Brief Assessment  Insurance and Status Reviewed  Patient has primary care physician Yes  Home environment has been reviewed Resides alone in an apartment  Prior level of function: Independent with ADLs at baseline  Prior/Current Home Services Current home services (Home oxygen)  Social Drivers of Health Review SDOH reviewed no interventions necessary  Readmission risk has been reviewed Yes  Transition of care needs no transition of care needs at this time

## 2024-01-11 NOTE — Progress Notes (Signed)
 Mobility Specialist - Progress Note   01/11/24 1127  Mobility  Activity Ambulated independently in hallway  Level of Assistance Independent  Assistive Device None  Distance Ambulated (ft) 300 ft  Activity Response Tolerated well  Mobility Referral Yes  Mobility visit 1 Mobility  Mobility Specialist Start Time (ACUTE ONLY) 1112  Mobility Specialist Stop Time (ACUTE ONLY) 1127  Mobility Specialist Time Calculation (min) (ACUTE ONLY) 15 min   Nurse requested Mobility Specialist to perform oxygen saturation test with pt which includes removing pt from oxygen both at rest and while ambulating.  Below are the results from that testing.     Patient Saturations on Room Air at Rest = spO2 93%  Patient Saturations on Room Air while Ambulating = sp02 88% .  Patient Saturations on 2 Liters of oxygen while Ambulating = sp02 92%  At end of testing pt left in room on 2  Liters of oxygen.  Reported results to nurse.  Pt received in recliner and agreeable to mobility. Prior to ambulating, pt requested assistance to [facility]. No complaints during session. Pt to recliner after session with all needs met.    [facility]

## 2024-01-11 NOTE — Progress Notes (Signed)
 [facility]  PROGRESS NOTE  Julia Hernandez:[id number] DOB: [birth year] DOA: 01/08/2024 PCP: Sigmund Hazel, MD   Brief HPI:   88 y.o. female with medical history significant for COPD, chronic HFpEF (EF 55 to 60%), CAD s/p DES to LAD (11/2018), CKD stage IIIa, HTN, HLD, tobacco use who presented to the ED for evaluation of shortness of breath.   CTA chest negative for evidence of PE.  Moderate CAD noted.  No acute cardiopulmonary disease.  Chronic compression fractures in the mid and lower thoracic spine and upper lumbar spine noted.   CT abdomen/pelvis with contrast showed stomach is under distended and mild diffuse gastric wall thickening is not excluded.  Nonobstructing left renal calculi.  Stable simple appearing cystic area in the right adnexa measuring 2.0 cm.  Sigmoid colon diverticulosis also noted.   Patient was given IV Solu-Medrol 125 mg, IV ceftriaxone and azithromycin, IV magnesium 2 g, DuoNeb treatment.   Assessment/Plan:   Acute hypoxic respiratory failure due to COPD exacerbation: SpO2 83% on room air on arrival to the ED, currently improved on 2 L O2 via [place].  She received steroids, antibiotics, and nebulizer treatment for COPD exacerbation.  She reports improving symptoms. -Placed on scheduled Brovana/Pulmicort with DuoNebs as needed -Will discontinue Brovana, start DuoNebs scheduled every 6 hours.  Continue Pulmicort. -Continue prednisone 40 mg daily -Continue supplemental oxygen and wean off as able; she is not on oxygen at home -Will give 1 dose of Lasix 40 mg p.o. x 1 -Get PT evaluation, check pulse ox on ambulation   Gastritis: Patient with intermittent epigastric pain and nausea without emesis.   CT with possible gastritis.   She has been started on oral Protonix.   Cholelithiasis: Patient reported RUQ abdominal pain in the ED.  RUQ ultrasound showed small amount of sludge versus nonshadowing stones.   She does not have any RUQ pain at time of admitting exam.   Doubt acute cholecystitis.   Chronic HFpEF: Chronic and stable.  Last EF 55-60%.   Continue Toprol-XL 50 mg daily, spironolactone 25 mg daily, losartan 12.5 mg daily and home oral Lasix 20 mg as needed. -1 dose of Lasix given as above   CAD s/p DES to LAD February 2020: Stable.  Continue aspirin and Toprol-XL.  Not currently on statin.   Hypertension: Continue losartan, Toprol-XL, spironolactone.   CKD stage IIIa: Renal function is stable.   Hyperlipidemia: Not currently on statin.   Tobacco use: Patient reports smoking 3-4 cigarettes daily.  Smoking cessation advised.   She declines nicotine patch.   Anxiety: Continue sertraline and diazepam 2.5 mg daily as needed for anxiety.    Medications     aspirin EC  81 mg Oral Daily   budesonide (PULMICORT) nebulizer solution  0.25 mg Nebulization BID   guaiFENesin  600 mg Oral BID   heparin  5,000 Units Subcutaneous Q8H   ipratropium-albuterol  3 mL Nebulization TID   losartan  12.5 mg Oral Daily   metoprolol succinate  50 mg Oral QHS   pantoprazole  40 mg Oral Daily   predniSONE  40 mg Oral Q breakfast   sertraline  100 mg Oral QHS   sodium chloride flush  3 mL Intravenous Q12H   spironolactone  25 mg Oral Daily     Data Reviewed:   CBG:  No results for input(s): "GLUCAP" in the last 168 hours.  SpO2: (!) 88 % O2 Flow Rate (L/min): 2 L/min    Vitals:   01/11/24 0400  01/11/24 0500 01/11/24 0813 01/11/24 0816  BP: (!) 150/67     Pulse: 63     Resp:      Temp: (!) 97.5 F (36.4 C)     TempSrc: Oral     SpO2: 92%  (!) 88% (!) 88%  Weight:  37.1 kg    Height:          Data Reviewed:  Basic Metabolic Panel: Recent Labs  Lab 01/08/24 1735 01/09/24 0546  NA 143 144  K 3.6 4.1  CL 98 101  CO2 37* 34*  GLUCOSE 117* 127*  BUN 13 12  CREATININE 0.97 0.91  CALCIUM 9.5 8.9    CBC: Recent Labs  Lab 01/08/24 1735 01/09/24 0546  WBC 8.6 5.9  NEUTROABS 6.3  --   HGB 14.2 13.8  HCT 46.0 45.7  MCV  94.1 96.6  PLT 196 180    LFT Recent Labs  Lab 01/08/24 1735  AST 16  ALT 7  ALKPHOS 84  BILITOT 0.4  PROT 7.2  ALBUMIN 4.1     Antibiotics: Anti-infectives (From admission, onward)    Start     Dose/Rate Route Frequency Ordered Stop   01/08/24 1815  cefTRIAXone (ROCEPHIN) 1 g in sodium chloride 0.9 % 100 mL IVPB        1 g 200 mL/hr over 30 Minutes Intravenous  Once 01/08/24 1803 01/08/24 1922   01/08/24 1815  azithromycin (ZITHROMAX) 500 mg in sodium chloride 0.9 % 250 mL IVPB        500 mg 250 mL/hr over 60 Minutes Intravenous  Once 01/08/24 1803 01/08/24 2052        DVT prophylaxis: Heparin  Code Status: DNR  Family Communication: No family at bedside   CONSULTS    Subjective   Denies shortness of breath.  Still requiring 2 L/min of oxygen  Objective    Physical Examination:  General-appears in no acute distress Heart-S1-S2, regular, no murmur auscultated Lungs-faint crackles at lung bases Abdomen-soft, nontender, no organomegaly Extremities-no edema in the lower extremities Neuro-alert, oriented x3, no focal deficit noted    Status is: Inpatient:             Meredeth Ide   [facility] If 7PM-7AM, please contact night-coverage at [email or web address], Office  [phone]   01/11/2024, 8:30 AM  LOS: 3 days

## 2024-01-11 NOTE — Progress Notes (Signed)
 SATURATION QUALIFICATIONS: (This note is used to comply with regulatory documentation for home oxygen)  Patient Saturations on Room Air at Rest = 88-92%  Patient Saturations on Room Air while Ambulating = 85%  Patient Saturations on 2 Liters of oxygen while Ambulating = 94%  Please briefly explain why patient needs home oxygen: decreases with ambulation

## 2024-01-12 DIAGNOSIS — J9601 Acute respiratory failure with hypoxia: Secondary | ICD-10-CM | POA: Diagnosis not present

## 2024-01-12 LAB — TROPONIN I (HIGH SENSITIVITY)
Troponin I (High Sensitivity): 105 ng/L (ref <18)
Troponin I (High Sensitivity): 121 ng/L (ref <18)
Troponin I (High Sensitivity): 124 ng/L (ref <18)

## 2024-01-12 LAB — BASIC METABOLIC PANEL WITH GFR
Anion gap: 9 (ref 5–15)
BUN: 23 mg/dL (ref 8–23)
CO2: 36 mmol/L — ABNORMAL HIGH (ref 22–32)
Calcium: 9 mg/dL (ref 8.9–10.3)
Chloride: 97 mmol/L — ABNORMAL LOW (ref 98–111)
Creatinine, Ser: 0.86 mg/dL (ref 0.44–1.00)
GFR, Estimated: >60 mL/min (ref >60)
Glucose, Bld: 71 mg/dL (ref 70–99)
Potassium: 4.4 mmol/L (ref 3.5–5.1)
Sodium: 142 mmol/L (ref 135–145)

## 2024-01-12 NOTE — TOC Transition Note (Signed)
 Transition of Care [facility]) - Discharge Note  Patient Details  Name: Julia Hernandez MRN: [id number] Date of Birth: 1936-08-26  Transition of Care [facility]) CM/SW Contact:  Ewing Schlein, LCSW Phone Number: 01/12/2024, 12:31 PM  Clinical Narrative: PT evaluation recommended HHPT. Patient will also need home oxygen. CSW spoke with patient regarding home oxygen and HHPT. Patient has used [facility] for HHPT before and is agreeable to using the agency again. Patient reported no preference for DME provider and is agreeable to [employer] providing the oxygen. CSW made HHPT referral to [facility] with [facility], which was accepted. CSW made DME order to [facility] with [facility], which was accepted. [employer] to deliver oxygen tank to patient's room. Patient and RN updated. TOC signing off.  Final next level of care: Home w Home Health Services Barriers to Discharge: No Barriers Identified  Patient Goals and CMS Choice Patient states their goals for this hospitalization and ongoing recovery are:: Return home CMS Medicare.gov Compare Post Acute Care list provided to:: Patient Choice offered to / list presented to : Patient  Discharge Plan and Services Additional resources added to the After Visit Summary for         DME Arranged: Oxygen DME Agency: [facility] Date DME Agency Contacted: 01/12/24 Time DME Agency Contacted: 1048 Representative spoke with at DME Agency: Marthann Schiller HH Arranged: PT HH Agency: [facility] Date [facility] Agency Contacted: 01/12/24 Time HH Agency Contacted: 1205 Representative spoke with at [facility] Agency: Clifton Custard  Social Drivers of Health (SDOH) Interventions SDOH Screenings   Food Insecurity: No Food Insecurity (01/08/2024)  Housing: Low Risk  (01/08/2024)  Transportation Needs: No Transportation Needs (01/08/2024)  Utilities: Not At Risk (01/08/2024)  Alcohol Screen: Low Risk  (09/03/2023)  Financial Resource Strain: Low Risk  (09/03/2023)  Social Connections: Moderately Integrated (01/08/2024)   Tobacco Use: High Risk (01/08/2024)   Readmission Risk Interventions    06/26/2022    8:54 AM  Readmission Risk Prevention Plan  Transportation Screening Complete  PCP or Specialist Appt within 3-5 Days Complete  HRI or Home Care Consult Complete  Social Work Consult for Recovery Care Planning/Counseling Complete  Palliative Care Screening Not Applicable  Medication Review [employer]) Complete

## 2024-01-12 NOTE — Evaluation (Addendum)
 Physical Therapy Evaluation Patient Details Name: Julia Hernandez MRN: [id number] DOB: 1936-07-20 Today's Date: 01/12/2024  History of Present Illness  88 y.o. female who presented to the ED for evaluation of shortness of breath. Dx of acute respiratory failure due to COPD exacerbation, gastritis. Pt with medical history significant for COPD, chronic HFpEF (EF 55 to 60%), CAD s/p DES to LAD (11/2018), CKD stage IIIa, HTN, HLD, tobacco use.  Clinical Impression  Pt admitted with above diagnosis. Pt ambulated 120' with RW, SpO2 dropped to 84% on room air while walking. Pt recovered to 91% on 1 L O2 after ~3 minutes seated rest with pursed lip breathing. Pt currently with functional limitations due to the deficits listed below (see PT Problem List). Pt will benefit from acute skilled PT to increase their independence and safety with mobility to allow discharge.           If plan is discharge home, recommend the following: Help with stairs or ramp for entrance;Assistance with cooking/housework   Can travel by private vehicle        Equipment Recommendations None recommended by PT  Recommendations for Other Services       Functional Status Assessment Patient has had a recent decline in their functional status and demonstrates the ability to make significant improvements in function in a reasonable and predictable amount of time.     Precautions / Restrictions Precautions Precautions: Other (comment) Recall of Precautions/Restrictions: Intact Precaution/Restrictions Comments: monitor O2 Restrictions Weight Bearing Restrictions Per Provider Order: No      Mobility  Bed Mobility               General bed mobility comments: up in recliner    Transfers Overall transfer level: Modified independent Equipment used: Rolling walker (2 wheels)                    Ambulation/Gait Ambulation/Gait assistance: Supervision Gait Distance (Feet): 120 Feet Assistive device: Rolling  walker (2 wheels) Gait Pattern/deviations: Step-through pattern, Decreased stride length Gait velocity: WFL     General Gait Details: 84% room air with ambulation, no dyspnea noted, recovered to 91% after 3 minutes seated rest with 1L O2,  verbal cues for pursed lip breathing while walking and during recovery, no loss of balance with ambulation  Stairs            Wheelchair Mobility     Tilt Bed    Modified Rankin (Stroke Patients Only)       Balance Overall balance assessment: Modified Independent                                           Pertinent Vitals/Pain Pain Assessment Pain Assessment: No/denies pain    Home Living Family/patient expects to be discharged to:: Private residence Living Arrangements: Alone Available Help at Discharge: Family;Available PRN/intermittently Type of Home: Apartment Home Access: Level entry       Home Layout: One level Home Equipment: Rollator (4 wheels);Rolling Walker (2 wheels);Shower seat;Grab bars - tub/shower Additional Comments: daughter lives nearby, neighbors can assist prn    Prior Function Prior Level of Function : Independent/Modified Independent;Driving             Mobility Comments: doesn't use AD in home, sometimes uses rollator when going out, no falls in past 6 months ADLs Comments: independent     Extremity/Trunk Assessment  Lower Extremity Assessment Lower Extremity Assessment: Overall WFL for tasks assessed    Cervical / Trunk Assessment Cervical / Trunk Assessment: Kyphotic  Communication   Communication Communication: No apparent difficulties    Cognition Arousal: Alert Behavior During Therapy: WFL for tasks assessed/performed   PT - Cognitive impairments: No apparent impairments                         Following commands: Intact       Cueing       General Comments      Exercises     Assessment/Plan    PT Assessment Patient needs  continued PT services  PT Problem List Decreased activity tolerance;Cardiopulmonary status limiting activity       PT Treatment Interventions Gait training;Therapeutic exercise    PT Goals (Current goals can be found in the Care Plan section)  Acute Rehab PT Goals Patient Stated Goal: return home, enjoys going [place] with her daughter PT Goal Formulation: With patient Time For Goal Achievement: 01/26/24 Potential to Achieve Goals: Good    Frequency Min 3X/week     Co-evaluation               AM-PAC PT "6 Clicks" Mobility  Outcome Measure Help needed turning from your back to your side while in a flat bed without using bedrails?: None Help needed moving from lying on your back to sitting on the side of a flat bed without using bedrails?: None Help needed moving to and from a bed to a chair (including a wheelchair)?: None Help needed standing up from a chair using your arms (e.g., wheelchair or bedside chair)?: None Help needed to walk in hospital room?: None Help needed climbing 3-5 steps with a railing? : A Little 6 Click Score: 23    End of Session Equipment Utilized During Treatment: Gait belt;Oxygen Activity Tolerance: Patient tolerated treatment well Patient left: in chair;with call bell/phone within reach;with chair alarm set Nurse Communication: Mobility status PT Visit Diagnosis: Difficulty in walking, not elsewhere classified (R26.2)    Time: 1191-4782 PT Time Calculation (min) (ACUTE ONLY): 21 min   Charges:   PT Evaluation $PT Eval Moderate Complexity: 1 Mod   PT General Charges $$ ACUTE PT VISIT: 1 Visit         Tamala Ser PT 01/12/2024  Acute Rehabilitation Services  Office [phone]

## 2024-01-12 NOTE — Progress Notes (Addendum)
 PROGRESS NOTE    Julia Hernandez  YQM:[id number] DOB: 1935-10-28 DOA: 01/08/2024 PCP: Sigmund Hazel, MD  Chief Complaint  Patient presents with   Shortness of Breath    Brief Narrative:   88 y.o. female with medical history significant for COPD, chronic HFpEF (EF 55 to 60%), CAD s/p DES to LAD (11/2018), CKD stage IIIa, HTN, HLD, tobacco use who presented to the ED for evaluation of shortness of breath.   She was admitted for Tiyonna Sardinha COPD exacerbation.  Assessment & Plan:   Principal Problem:   Acute respiratory failure with hypoxia (HCC) Active Problems:   COPD with acute exacerbation (HCC)   Chronic diastolic CHF (congestive heart failure) (HCC)   Stage 3a chronic kidney disease (CKD) (HCC)   Hyperlipidemia   Atherosclerotic heart disease of native coronary artery without angina pectoris   Essential hypertension  Chest Pain  Elevated Troponin Troponin minimally elevated and relatively flat (but up from day of admission on 3/29) Possibly related to demand ischemia.  Low suspicion for ACS. EKG today appears similar to prior with RBBB Will follow echo for wall motion abnormality Will c/s cardiology if abnormal echo, recurrent pain, or other concerning findings  Acute hypoxic respiratory failure due to COPD exacerbation: Currently requiring 2 L with activity Plan to discharge home on 2 L when stable Continue steroids, scheduled and prn nebs   Gastritis: Patient with intermittent epigastric pain and nausea without emesis.   CT with possible gastritis.   She has been started on oral Protonix.   Cholelithiasis: Patient reported RUQ abdominal pain in the ED.  RUQ ultrasound showed small amount of sludge versus nonshadowing stones.   She does not have any RUQ pain at time of admitting exam.  Doubt acute cholecystitis.   Chronic HFpEF: Chronic and stable.  Last EF 55-60%.   Continue Toprol-XL 50 mg daily, spironolactone 25 mg daily, losartan 12.5 mg daily and home oral Lasix 20 mg as  needed.   CAD s/p DES to LAD February 2020: Stable.  Continue aspirin and Toprol-XL.  Not currently on statin.   Hypertension: Continue losartan, Toprol-XL, spironolactone.   CKD stage IIIa: Renal function is stable.   Hyperlipidemia: Not currently on statin.   Tobacco use: Patient reports smoking 3-4 cigarettes daily.  Smoking cessation advised.   She declines nicotine patch.   Anxiety: Continue sertraline and diazepam 2.5 mg daily as needed for anxiety.    DVT prophylaxis: SCD, heparin Code Status: DNR Family Communication: none Disposition:   Status is: Inpatient Remains inpatient appropriate because: need for ongoing care   Consultants:  none  Procedures:  none  Antimicrobials:  Anti-infectives (From admission, onward)    Start     Dose/Rate Route Frequency Ordered Stop   01/08/24 1815  cefTRIAXone (ROCEPHIN) 1 g in sodium chloride 0.9 % 100 mL IVPB        1 g 200 mL/hr over 30 Minutes Intravenous  Once 01/08/24 1803 01/08/24 1922   01/08/24 1815  azithromycin (ZITHROMAX) 500 mg in sodium chloride 0.9 % 250 mL IVPB        500 mg 250 mL/hr over 60 Minutes Intravenous  Once 01/08/24 1803 01/08/24 2052       Subjective: Feels better from Sahil Milner respiratory standpoint Noted CP this AM - lasted Lisabeth Mian few minutes - while she was getting neb?  Hasn't had this before  Objective: Vitals:   01/12/24 0501 01/12/24 0800 01/12/24 0822 01/12/24 1306  BP: (!) 140/74  (!) 155/61 114/71  Pulse:  68  63 67  Resp: 18  20 20   Temp: (!) 97.3 F (36.3 C)  97.8 F (36.6 C) 98.1 F (36.7 C)  TempSrc: Oral  Oral Oral  SpO2: 95% 93% 95% 91%  Weight:      Height:        Intake/Output Summary (Last 24 hours) at 01/12/2024 1710 Last data filed at 01/12/2024 0512 Gross per 24 hour  Intake --  Output 200 ml  Net -200 ml   Filed Weights   01/09/24 0418 01/10/24 0453 01/11/24 0500  Weight: 39.9 kg 40.1 kg 37.1 kg    Examination:  General exam: Appears calm and comfortable   Respiratory system: Clear to auscultation. Respiratory effort normal. Cardiovascular system: RRR Gastrointestinal system: Abdomen is nondistended, soft and nontender Central nervous system: Alert and oriented. No focal neurological deficits. Extremities: no LEE    Data Reviewed: I have personally reviewed following labs and imaging studies  CBC: Recent Labs  Lab 01/08/24 1735 01/09/24 0546  WBC 8.6 5.9  NEUTROABS 6.3  --   HGB 14.2 13.8  HCT 46.0 45.7  MCV 94.1 96.6  PLT 196 180    Basic Metabolic Panel: Recent Labs  Lab 01/08/24 1735 01/09/24 0546 01/12/24 0516  NA 143 144 142  K 3.6 4.1 4.4  CL 98 101 97*  CO2 37* 34* 36*  GLUCOSE 117* 127* 71  BUN 13 12 23   CREATININE 0.97 0.91 0.86  CALCIUM 9.5 8.9 9.0    GFR: Estimated Creatinine Clearance: 27.4 mL/min (by C-G formula based on SCr of 0.86 mg/dL).  Liver Function Tests: Recent Labs  Lab 01/08/24 1735  AST 16  ALT 7  ALKPHOS 84  BILITOT 0.4  PROT 7.2  ALBUMIN 4.1    CBG: No results for input(s): "GLUCAP" in the last 168 hours.   Recent Results (from the past 240 hours)  Resp panel by RT-PCR (RSV, Flu Sofie Schendel&B, Covid) Anterior Nasal Swab     Status: None   Collection Time: 01/08/24  5:35 PM   Specimen: Anterior Nasal Swab  Result Value Ref Range Status   SARS Coronavirus 2 by RT PCR NEGATIVE NEGATIVE Final    Comment: (NOTE) SARS-CoV-2 target nucleic acids are NOT DETECTED.  The SARS-CoV-2 RNA is generally detectable in upper respiratory specimens during the acute phase of infection. The lowest concentration of SARS-CoV-2 viral copies this assay can detect is 138 copies/mL. Rashanda Magloire negative result does not preclude SARS-Cov-2 infection and should not be used as the sole basis for treatment or other patient management decisions. Remer Couse negative result may occur with  improper specimen collection/handling, submission of specimen other than nasopharyngeal swab, presence of viral mutation(s) within the areas  targeted by this assay, and inadequate number of viral copies(<138 copies/mL). Tanyla Stege negative result must be combined with clinical observations, patient history, and epidemiological information. The expected result is Negative.  Fact Sheet for Patients:  BloggerCourse.com  Fact Sheet for Healthcare Providers:  SeriousBroker.it  This test is no t yet approved or cleared by the [place] FDA and  has been authorized for detection and/or diagnosis of SARS-CoV-2 by FDA under an Emergency Use Authorization (EUA). This EUA will remain  in effect (meaning this test can be used) for the duration of the COVID-19 declaration under Section 564(b)(1) of the Act, 21 U.S.C.section 360bbb-3(b)(1), unless the authorization is terminated  or revoked sooner.       Influenza Hortense Cantrall by PCR NEGATIVE NEGATIVE Final   Influenza B by PCR NEGATIVE  NEGATIVE Final    Comment: (NOTE) The Xpert Xpress SARS-CoV-2/FLU/RSV plus assay is intended as an aid in the diagnosis of influenza from Nasopharyngeal swab specimens and should not be used as Jujhar Everett sole basis for treatment. Nasal washings and aspirates are unacceptable for Xpert Xpress SARS-CoV-2/FLU/RSV testing.  Fact Sheet for Patients: BloggerCourse.com  Fact Sheet for Healthcare Providers: SeriousBroker.it  This test is not yet approved or cleared by the [place] FDA and has been authorized for detection and/or diagnosis of SARS-CoV-2 by FDA under an Emergency Use Authorization (EUA). This EUA will remain in effect (meaning this test can be used) for the duration of the COVID-19 declaration under Section 564(b)(1) of the Act, 21 U.S.C. section 360bbb-3(b)(1), unless the authorization is terminated or revoked.     Resp Syncytial Virus by PCR NEGATIVE NEGATIVE Final    Comment: (NOTE) Fact Sheet for  Patients: BloggerCourse.com  Fact Sheet for Healthcare Providers: SeriousBroker.it  This test is not yet approved or cleared by the [place] FDA and has been authorized for detection and/or diagnosis of SARS-CoV-2 by FDA under an Emergency Use Authorization (EUA). This EUA will remain in effect (meaning this test can be used) for the duration of the COVID-19 declaration under Section 564(b)(1) of the Act, 21 U.S.C. section 360bbb-3(b)(1), unless the authorization is terminated or revoked.  Performed at [facility], [address] [place], [place] [address]          Radiology Studies: No results found.      Scheduled Meds:  aspirin EC  81 mg Oral Daily   budesonide (PULMICORT) nebulizer solution  0.25 mg Nebulization BID   guaiFENesin  600 mg Oral BID   heparin  5,000 Units Subcutaneous Q8H   ipratropium-albuterol  3 mL Nebulization BID   losartan  12.5 mg Oral Daily   metoprolol succinate  50 mg Oral QHS   pantoprazole  40 mg Oral Daily   predniSONE  40 mg Oral Q breakfast   sertraline  100 mg Oral QHS   sodium chloride flush  3 mL Intravenous Q12H   spironolactone  25 mg Oral Daily   Continuous Infusions:   LOS: 4 days    Time spent: over 30 min    Lacretia Nicks, MD [facility]   To contact the attending provider between 7A-7P or the covering provider during after hours 7P-7A, please log into the web site [email or web address] and access using universal [place] password for that web site. If you do not have the password, please call the hospital operator.  01/12/2024, 5:10 PM

## 2024-01-12 NOTE — Plan of Care (Signed)

## 2024-01-13 ENCOUNTER — Inpatient Hospital Stay (HOSPITAL_COMMUNITY)

## 2024-01-13 ENCOUNTER — Other Ambulatory Visit (HOSPITAL_COMMUNITY): Payer: Self-pay

## 2024-01-13 ENCOUNTER — Other Ambulatory Visit: Payer: Self-pay

## 2024-01-13 DIAGNOSIS — J9601 Acute respiratory failure with hypoxia: Secondary | ICD-10-CM

## 2024-01-13 DIAGNOSIS — R7989 Other specified abnormal findings of blood chemistry: Secondary | ICD-10-CM | POA: Diagnosis not present

## 2024-01-13 LAB — ECHOCARDIOGRAM COMPLETE
AR max vel: 2.57 cm2
AV Area VTI: 2.98 cm2
AV Area mean vel: 2.66 cm2
AV Mean grad: 4 mmHg
AV Peak grad: 7.7 mmHg
Ao pk vel: 1.39 m/s
Area-P 1/2: 4.29 cm2
Height: 56 in
MV VTI: 2.77 cm2
S' Lateral: 2.7 cm
Weight: 1308.65 [oz_av]

## 2024-01-13 LAB — CBC
HCT: 42.9 % (ref 36.0–46.0)
Hemoglobin: 12.8 g/dL (ref 12.0–15.0)
MCH: 29.2 pg (ref 26.0–34.0)
MCHC: 29.8 g/dL — ABNORMAL LOW (ref 30.0–36.0)
MCV: 97.7 fL (ref 80.0–100.0)
Platelets: 161 10*3/uL (ref 150–400)
RBC: 4.39 MIL/uL (ref 3.87–5.11)
RDW: 13.2 % (ref 11.5–15.5)
WBC: 6.9 10*3/uL (ref 4.0–10.5)
nRBC: 0 % (ref 0.0–0.2)

## 2024-01-13 LAB — BASIC METABOLIC PANEL WITH GFR
Anion gap: 6 (ref 5–15)
BUN: 24 mg/dL — ABNORMAL HIGH (ref 8–23)
CO2: 37 mmol/L — ABNORMAL HIGH (ref 22–32)
Calcium: 8.9 mg/dL (ref 8.9–10.3)
Chloride: 97 mmol/L — ABNORMAL LOW (ref 98–111)
Creatinine, Ser: 0.95 mg/dL (ref 0.44–1.00)
GFR, Estimated: 59 mL/min — ABNORMAL LOW (ref >60)
Glucose, Bld: 79 mg/dL (ref 70–99)
Potassium: 3.7 mmol/L (ref 3.5–5.1)
Sodium: 140 mmol/L (ref 135–145)

## 2024-01-13 LAB — MAGNESIUM: Magnesium: 2.1 mg/dL (ref 1.7–2.4)

## 2024-01-13 LAB — PHOSPHORUS: Phosphorus: 3.6 mg/dL (ref 2.5–4.6)

## 2024-01-13 LAB — TROPONIN I (HIGH SENSITIVITY): Troponin I (High Sensitivity): 112 ng/L (ref <18)

## 2024-01-13 MED ORDER — COMBIVENT RESPIMAT 20-100 MCG/ACT IN AERS
1.0000 | INHALATION_SPRAY | Freq: Four times a day (QID) | RESPIRATORY_TRACT | 1 refills | Status: AC | PRN
  Filled 2024-01-13: qty 4, 30d supply, fill #0

## 2024-01-13 MED ORDER — SYMBICORT 160-4.5 MCG/ACT IN AERO
2.0000 | INHALATION_SPRAY | Freq: Two times a day (BID) | RESPIRATORY_TRACT | 1 refills | Status: DC
  Filled 2024-01-13 (×2): qty 10.2, 30d supply, fill #0

## 2024-01-13 MED ORDER — DIAZEPAM 5 MG PO TABS: 2.5000 mg | ORAL_TABLET | Freq: Every evening | ORAL | Status: DC

## 2024-01-13 MED ORDER — PREDNISONE 20 MG PO TABS
ORAL_TABLET | ORAL | 0 refills | Status: AC
Start: 2024-01-14 — End: 2024-01-20
  Filled 2024-01-13: qty 5, 6d supply, fill #0

## 2024-01-13 NOTE — Progress Notes (Signed)
 Discharge medications delivered to patient at bedside [facility]

## 2024-01-13 NOTE — Discharge Summary (Addendum)
 Physician Discharge Summary  Julia Hernandez WUJ:[id number] DOB: Jul 29, 1936 DOA: 01/08/2024  PCP: Julia Hazel, MD  Admit date: 01/08/2024 Discharge date: 01/13/2024  Time spent: 40 minutes  Recommendations for Outpatient Follow-up:  Follow outpatient CBC/CMP  Follow O2 needs outpatient - wean as tolerated Follow with cards outpatient for elevated troponin - suspected demand ischemia in setting of her COPD exacerbation Needs GI follow up for gastritis   Slurred speech 4/3, she notes intermittent x6 months, thinks it's related to her dentures not fitting well - follow outpatient Will refer to pulmonology for COPD  Discharge Diagnoses:  Principal Problem:   Acute respiratory failure with hypoxia (HCC) Active Problems:   COPD with acute exacerbation (HCC)   Chronic diastolic CHF (congestive heart failure) (HCC)   Stage 3a chronic kidney disease (CKD) (HCC)   Hyperlipidemia   Atherosclerotic heart disease of native coronary artery without angina pectoris   Essential hypertension   Discharge Condition: stable  Diet recommendation: heart healthy  Filed Weights   01/09/24 0418 01/10/24 0453 01/11/24 0500  Weight: 39.9 kg 40.1 kg 37.1 kg    History of present illness:   88 y.o. female with medical history significant for COPD, chronic HFpEF (EF 55 to 60%), CAD s/p DES to LAD (11/2018), CKD stage IIIa, HTN, HLD, tobacco use who presented to the ED for evaluation of shortness of breath.    She was admitted for Julia Hernandez COPD exacerbation.  Stable for discharge on 4/3.  See below for additional details.  Hospital Course:  Assessment and Plan:  Chest Pain  Elevated Troponin Self limited CP on morning of 4/2 Troponin minimally elevated and relatively flat (but up from day of admission on 3/29) Possibly related to demand ischemia related to her COPD exacerbation.  Low suspicion for ACS. EKG appeared similar to prior with RBBB Echo without wall motion abnormality Plan for outpatient follow  up   Acute hypoxic respiratory failure due to COPD exacerbation: Currently requiring 2 L with activity O2 screen - on RA at rest, requiring 2 L with ambulation (see 4/1 note) Will discharge on 2 L with activity Steroid taper.  Continue home symbicort.    Gastritis: Patient with intermittent epigastric pain and nausea without emesis.   CT with possible gastritis.   She has been started on oral Protonix. Follow with gastroenterology outpatient   Cholelithiasis: Patient reported RUQ abdominal pain in the ED.  RUQ ultrasound showed small amount of sludge versus nonshadowing stones.   She does not have any RUQ pain at time of admitting exam.  Doubt acute cholecystitis.   Chronic HFpEF: Chronic and stable.  Last EF 55-60%.   Continue Toprol-XL 50 mg daily, spironolactone 25 mg daily, losartan 12.5 mg daily and home oral Lasix 20 mg as needed.   CAD s/p DES to LAD February 2020: Stable.  Continue aspirin and Toprol-XL.  Crestor.   Hypertension: Continue losartan, Toprol-XL, spironolactone.   CKD stage IIIa: Renal function is stable.   Hyperlipidemia: Resume statin   Tobacco use: Patient reports smoking 3-4 cigarettes daily.  Smoking cessation advised.   She declined nicotine patch.   Anxiety: Continue sertraline and diazepam 2.5 mg daily as needed for anxiety.  Dysarthria Ongoing x6 months, she thinks related to her dentures - no other neurologic sx Needs further follow up outpatient       Procedures:  Echo IMPRESSIONS     1. Left ventricular ejection fraction, by estimation, is 55 to 60%. Left  ventricular ejection fraction by PLAX is 59 %.  The left ventricle has  normal function. The left ventricle has no regional wall motion  abnormalities. There is mild left ventricular  hypertrophy. Left ventricular diastolic parameters are consistent with  Grade I diastolic dysfunction (impaired relaxation).   2. Right ventricular systolic function is normal. The right  ventricular  size is normal. Tricuspid regurgitation signal is inadequate for assessing  PA pressure.   3. Left atrial size was mildly dilated.   4. The mitral valve is abnormal. Trivial mitral valve regurgitation.   5. The aortic valve is tricuspid. Aortic valve regurgitation is not  visualized. Aortic valve sclerosis/calcification is present, without any  evidence of aortic stenosis. Aortic valve mean gradient measures 4.0 mmHg.   6. The inferior vena cava is normal in size with <50% respiratory  variability, suggesting right atrial pressure of 8 mmHg.   Comparison(s): Changes from prior study are noted. 08/31/2023: LVEF  55-60%.   Consultations: none  Discharge Exam: Vitals:   01/13/24 0403 01/13/24 0754  BP: 130/61   Pulse: (!) 59   Resp: 14   Temp: 97.7 F (36.5 C)   SpO2: 98% 96%   No complaints Feels 90% better than yesterday (said something similar yesterday) Has some slurred speech today which is chronic x6 months, she thinks due to her dentures  General: No acute distress. Cardiovascular: RRR Lungs: mild scattered wheezing. Neurological: Alert and oriented 3. Moves all extremities 4 with equal strength. Cranial nerves II through XII grossly intact. Extremities: No clubbing or cyanosis. No edema.   Discharge Instructions   Discharge Instructions     Ambulatory referral to Cardiology   Complete by: As directed    Call MD for:  difficulty breathing, headache or visual disturbances   Complete by: As directed    Call MD for:  extreme fatigue   Complete by: As directed    Call MD for:  hives   Complete by: As directed    Call MD for:  persistant dizziness or light-headedness   Complete by: As directed    Call MD for:  persistant nausea and vomiting   Complete by: As directed    Call MD for:  redness, tenderness, or signs of infection (pain, swelling, redness, odor or green/yellow discharge around incision site)   Complete by: As directed    Call MD for:   severe uncontrolled pain   Complete by: As directed    Call MD for:  temperature >100.4   Complete by: As directed    Diet - low sodium heart healthy   Complete by: As directed    Discharge instructions   Complete by: As directed    You were seen for Julia Hernandez COPD exacerbation.  You've improved.  We'll send you home with Keishawna Carranza steroid taper.  Continue your symbicort daily.  Use your albuterol as needed.  We'll send you home with oxygen to use with activity and at night.  Follow with your outpatient doctor to determine whether this can be weaned.  You had an episode of chest pain.  It's unclear what led to this event, but our workup showed some evidence of stress on the heart that I suspect was due to your COPD exacerbation.  Your ultrasound of the heart was reassuring.  I think you should follow with cardiology as an outpatient for additional workup.  If you have recurrent chest pain, that is something you should return to the hospital for or discuss with your provider.   Your CT scan showed findings consistent with  gastritis (inflammation in the stomach).  Continue your protonix.  Follow with gastroenterology as an outpatient.   You should follow your speech changes with your outpatient provider.  Follow up your concerns about your dentures outpatient.   Return for new, recurrent, or worsening symptoms.  Please ask your PCP to request records from this hospitalization so they know what was done and what the next steps will be.   Increase activity slowly   Complete by: As directed       Allergies as of 01/13/2024       Reactions   Influenza Vac Split Quad Shortness Of Breath   Ditropan Xl [oxybutynin] Other (See Comments)   Unknown reaction   Simvastatin Rash        Medication List     TAKE these medications    acetaminophen 500 MG tablet Commonly known as: TYLENOL Take 500 mg by mouth every 6 (six) hours as needed for moderate pain (pain score 4-6).   acyclovir ointment 5  % Commonly known as: ZOVIRAX Apply 1 application topically 3 (three) times daily as needed (outbreaks).   aspirin EC 81 MG tablet Take 1 tablet (81 mg total) by mouth daily. Swallow whole.   B-complex with vitamin C tablet Take 1 tablet by mouth daily.   Combivent Respimat 20-100 MCG/ACT Aers respimat Generic drug: Ipratropium-Albuterol Inhale 1 puff into the lungs 4 (four) times daily as needed for shortness of breath.   diazepam 5 MG tablet Commonly known as: VALIUM Take 0.5 tablets (2.5 mg total) by mouth at bedtime.   ferrous sulfate 325 (65 FE) MG tablet Take 1 tablet (325 mg total) by mouth daily with breakfast.   furosemide 20 MG tablet Commonly known as: LASIX Take 1 tablet (20 mg total) by mouth daily as needed (weight gain of >3 lbs in 1 day or >5 lbs in 1 week).   losartan 25 MG tablet Commonly known as: COZAAR Take 0.5 tablets (12.5 mg total) by mouth daily.   magic mouthwash w/lidocaine Soln Take 5 mLs by mouth 4 (four) times daily as needed for mouth pain.   metoprolol succinate 25 MG 24 hr tablet Commonly known as: TOPROL-XL Take 50 mg by mouth daily.   pantoprazole 40 MG tablet Commonly known as: PROTONIX Take 1 tablet (40 mg total) by mouth daily.   predniSONE 20 MG tablet Commonly known as: DELTASONE Take 1 tablet (20 mg total) by mouth daily with breakfast for 3 days, THEN 0.5 tablets (10 mg total) daily with breakfast for 3 days. Start taking on: January 14, 2024   rosuvastatin 5 MG tablet Commonly known as: CRESTOR Take 5 mg by mouth every other day.   sertraline 100 MG tablet Commonly known as: ZOLOFT Take 100 mg by mouth at bedtime.   spironolactone 25 MG tablet Commonly known as: ALDACTONE Take 1 tablet (25 mg total) by mouth daily.   Symbicort 160-4.5 MCG/ACT inhaler Generic drug: budesonide-formoterol Inhale 2 puffs into the lungs 2 (two) times daily.               Durable Medical Equipment  (From admission, onward)            Start     Ordered   01/12/24 1155  For home use only DME oxygen  Once       Comments: SATURATION QUALIFICATIONS: (This note is used to comply with regulatory documentation for home oxygen)   Patient Saturations on Room Air at Rest = 88-92%   Patient  Saturations on Room Air while Ambulating = 85%   Patient Saturations on 2 Liters of oxygen while Ambulating = 94%   Please briefly explain why patient needs home oxygen: decreases with ambulation  Question Answer Comment  Length of Need 12 Months   Mode or (Route) Nasal cannula   Liters per Minute 2   Frequency Continuous (stationary and portable oxygen unit needed)   Oxygen conserving device Yes   Oxygen delivery system Gas      01/12/24 1155           Allergies  Allergen Reactions   Influenza Vac Split Quad Shortness Of Breath   Ditropan Xl [Oxybutynin] Other (See Comments)    Unknown reaction   Simvastatin Rash    Follow-up Information     [facility] Follow up.   Specialty: Home Health Services Why: [facility] will provide PT in the home after discharge. Contact information: [address] [place] [address] [phone]         [facility] Follow up.   Why: [facility] will provide home oxygen.                 The results of significant diagnostics from this hospitalization (including imaging, microbiology, ancillary and laboratory) are listed below for reference.    Significant Diagnostic Studies: ECHOCARDIOGRAM COMPLETE Result Date: 01/13/2024    ECHOCARDIOGRAM REPORT   Patient Name:   Julia Hernandez Date of Exam: 01/13/2024 Medical Rec #:  [id number]    Height:       56.0 in Accession #:    [id number]   Weight:       81.8 lb Date of Birth:  April 19, 1936     BSA:          1.214 m Patient Age:    87 years     BP:           130/61 mmHg Patient Gender: F            HR:           64 bpm. Exam Location:  Inpatient Procedure: 2D Echo, Cardiac Doppler and Color Doppler (Both Spectral  and Color            Flow Doppler were utilized during procedure). Indications:    Elevated Troponin  History:        Patient has prior history of Echocardiogram examinations.                 Cardiomyopathy, COPD; Risk Factors:Current Smoker.  Sonographer:    Lamont Snowball Referring Phys: [phone] Carlia Bomkamp CALDWELL POWELL JR IMPRESSIONS  1. Left ventricular ejection fraction, by estimation, is 55 to 60%. Left ventricular ejection fraction by PLAX is 59 %. The left ventricle has normal function. The left ventricle has no regional wall motion abnormalities. There is mild left ventricular hypertrophy. Left ventricular diastolic parameters are consistent with Grade I diastolic dysfunction (impaired relaxation).  2. Right ventricular systolic function is normal. The right ventricular size is normal. Tricuspid regurgitation signal is inadequate for assessing PA pressure.  3. Left atrial size was mildly dilated.  4. The mitral valve is abnormal. Trivial mitral valve regurgitation.  5. The aortic valve is tricuspid. Aortic valve regurgitation is not visualized. Aortic valve sclerosis/calcification is present, without any evidence of aortic stenosis. Aortic valve mean gradient measures 4.0 mmHg.  6. The inferior vena cava is normal in size with <50% respiratory variability, suggesting right atrial pressure of 8 mmHg. Comparison(s): Changes  from prior study are noted. 08/31/2023: LVEF 55-60%. FINDINGS  Left Ventricle: Left ventricular ejection fraction, by estimation, is 55 to 60%. Left ventricular ejection fraction by PLAX is 59 %. The left ventricle has normal function. The left ventricle has no regional wall motion abnormalities. The left ventricular internal cavity size was normal in size. There is mild left ventricular hypertrophy. Left ventricular diastolic parameters are consistent with Grade I diastolic dysfunction (impaired relaxation). Indeterminate filling pressures. Right Ventricle: The right ventricular size is normal. No  increase in right ventricular wall thickness. Right ventricular systolic function is normal. Tricuspid regurgitation signal is inadequate for assessing PA pressure. Left Atrium: Left atrial size was mildly dilated. Right Atrium: Right atrial size was normal in size. Pericardium: There is no evidence of pericardial effusion. Mitral Valve: The mitral valve is abnormal. Trivial mitral valve regurgitation. MV peak gradient, 5.6 mmHg. The mean mitral valve gradient is 2.0 mmHg. Tricuspid Valve: The tricuspid valve is normal in structure. Tricuspid valve regurgitation is not demonstrated. Aortic Valve: The aortic valve is tricuspid. Aortic valve regurgitation is not visualized. Aortic valve sclerosis/calcification is present, without any evidence of aortic stenosis. Aortic valve mean gradient measures 4.0 mmHg. Aortic valve peak gradient measures 7.7 mmHg. Aortic valve area, by VTI measures 2.98 cm. Pulmonic Valve: The pulmonic valve was normal in structure. Pulmonic valve regurgitation is not visualized. Aorta: The aortic root and ascending aorta are structurally normal, with no evidence of dilitation. Venous: The inferior vena cava is normal in size with less than 50% respiratory variability, suggesting right atrial pressure of 8 mmHg. IAS/Shunts: No atrial level shunt detected by color flow Doppler.  LEFT VENTRICLE PLAX 2D LV EF:         Left            Diastology                ventricular     LV e' medial:    5.33 cm/s                ejection        LV E/e' medial:  14.0                fraction by     LV e' lateral:   6.64 cm/s                PLAX is 59      LV E/e' lateral: 11.2                %. LVIDd:         3.90 cm LVIDs:         2.70 cm LV PW:         1.00 cm LV IVS:        1.10 cm LVOT diam:     1.90 cm LV SV:         84 LV SV Index:   69 LVOT Area:     2.84 cm  RIGHT VENTRICLE            IVC RV Basal diam:  3.10 cm    IVC diam: 1.70 cm RV S prime:     9.57 cm/s TAPSE (M-mode): 1.8 cm LEFT ATRIUM            Index        RIGHT ATRIUM           Index LA Vol (A2C): 44.7 ml 36.82 ml/m  RA Area:  10.30 cm LA Vol (A4C): 26.9 ml 22.16 ml/m  RA Volume:   22.00 ml  18.12 ml/m  AORTIC VALVE AV Area (Vmax):    2.57 cm AV Area (Vmean):   2.66 cm AV Area (VTI):     2.98 cm AV Vmax:           139.00 cm/s AV Vmean:          98.600 cm/s AV VTI:            0.282 m AV Peak Grad:      7.7 mmHg AV Mean Grad:      4.0 mmHg LVOT Vmax:         126.00 cm/s LVOT Vmean:        92.500 cm/s LVOT VTI:          0.296 m LVOT/AV VTI ratio: 1.05  AORTA Ao Root diam: 2.60 cm Ao Asc diam:  2.60 cm MITRAL VALVE MV Area (PHT): 4.29 cm     SHUNTS MV Area VTI:   2.77 cm     Systemic VTI:  0.30 m MV Peak grad:  5.6 mmHg     Systemic Diam: 1.90 cm MV Mean grad:  2.0 mmHg MV Vmax:       1.18 m/s MV Vmean:      64.1 cm/s MV Decel Time: 177 msec MV E velocity: 74.60 cm/s MV Nycole Kawahara velocity: 111.00 cm/s MV E/Lavanya Roa ratio:  0.67 Zoila Shutter MD Electronically signed by Zoila Shutter MD Signature Date/Time: 01/13/2024/12:28:41 PM    Final    CT Angio Chest PE W/Cm &/Or Wo Cm Result Date: 01/08/2024 CLINICAL DATA:  Hypoxia, shortness of breath EXAM: CT ANGIOGRAPHY CHEST WITH CONTRAST TECHNIQUE: Multidetector CT imaging of the chest was performed using the standard protocol during bolus administration of intravenous contrast. Multiplanar CT image reconstructions and MIPs were obtained to evaluate the vascular anatomy. RADIATION DOSE REDUCTION: This exam was performed according to the departmental dose-optimization program which includes automated exposure control, adjustment of the mA and/or kV according to patient size and/or use of iterative reconstruction technique. CONTRAST:  80mL OMNIPAQUE IOHEXOL 350 MG/ML SOLN COMPARISON:  03/07/2022 FINDINGS: Cardiovascular: No filling defects in the pulmonary arteries to suggest pulmonary emboli. Moderate coronary artery and aortic atherosclerosis. Heart is normal size. Aorta is normal caliber. Mediastinum/Nodes: No  mediastinal, hilar, or axillary adenopathy. Trachea and esophagus are unremarkable. Thyroid unremarkable. Lungs/Pleura: Calcified granuloma in the anterior right middle lobe. Linear scarring in the lung bases. No acute confluent airspace opacity or effusion. Upper Abdomen: No acute findings Musculoskeletal: Chest wall soft tissues are unremarkable. No acute bony abnormality. Severe compression fractures noted at T8 and T9, progressed since prior study. Mild compression fractures involving T11 through L2, similar to prior study. Review of the MIP images confirms the above findings. IMPRESSION: No evidence of pulmonary embolus. Moderate coronary artery disease. No acute cardiopulmonary disease. Chronic compression fractures in the mid and lower thoracic spine and upper lumbar spine as described above. Aortic Atherosclerosis (ICD10-I70.0). Electronically Signed   By: Charlett Nose M.D.   On: 01/08/2024 19:08   CT ABDOMEN PELVIS W CONTRAST Result Date: 01/08/2024 CLINICAL DATA:  Epigastric rebound tenderness. EXAM: CT ABDOMEN AND PELVIS WITH CONTRAST TECHNIQUE: Multidetector CT imaging of the abdomen and pelvis was performed using the standard protocol following bolus administration of intravenous contrast. RADIATION DOSE REDUCTION: This exam was performed according to the departmental dose-optimization program which includes automated exposure control, adjustment of the mA and/or kV according to patient  size and/or use of iterative reconstruction technique. CONTRAST:  80mL OMNIPAQUE IOHEXOL 350 MG/ML SOLN COMPARISON:  CT abdomen and pelvis 03/07/2022 FINDINGS: Lower chest: No acute abnormality. Hepatobiliary: No focal liver abnormality is seen. No gallstones, gallbladder wall thickening, or biliary dilatation. Pancreas: Unremarkable. No pancreatic ductal dilatation or surrounding inflammatory changes. Spleen: Normal in size without focal abnormality. Adrenals/Urinary Tract: 1 cm cyst in the right kidney is unchanged.  There are punctate left renal calculi. There is no hydronephrosis or perinephric fluid. The adrenal glands and bladder are within normal limits. Stomach/Bowel: The stomach is under distended and mild diffuse gastric wall thickening is not excluded. There is no surrounding inflammation. No dilated bowel loops are seen. The appendix is not visualized. Sigmoid colon diverticulosis is present. Vascular/Lymphatic: Aorta and IVC are normal in size. There are atherosclerotic calcifications of the aorta and iliac arteries. Reproductive: Uterus and left ovary are not seen. There is Edrick Whitehorn simple appearing cystic area in the right adnexa measuring 2.0 by 1.8 cm which is unchanged from 2023. Other: No abdominal wall hernia or abnormality. No abdominopelvic ascites. Musculoskeletal: Chronic compression deformity of L2 is unchanged. IMPRESSION: 1. The stomach is under distended and mild diffuse gastric wall thickening is not excluded. Correlate clinically for gastritis. 2. Nonobstructing left renal calculi. 3. Stable simple appearing cystic area in the right adnexa measuring 2.0 cm. No follow-up imaging recommended. 4. Sigmoid colon diverticulosis. 5. Aortic atherosclerosis. Aortic Atherosclerosis (ICD10-I70.0). Electronically Signed   By: Darliss Cheney M.D.   On: 01/08/2024 19:07   US Abdomen Limited RUQ (LIVER/GB) Result Date: 01/08/2024 CLINICAL DATA:  [id number] RUQ pain [id number] EXAM: ULTRASOUND ABDOMEN LIMITED RIGHT UPPER QUADRANT COMPARISON:  Mar 07, 2022. FINDINGS: Gallbladder: No wall thickening visualized. No sonographic Murphy sign noted by sonographer. Charmin Aguiniga small amount sludge versus nonshadowing stones is noted. Common bile duct: Diameter: Visualized portion measures 4 mm, within normal limits. Liver: No focal lesion identified. Within normal limits in parenchymal echogenicity. Portal vein is patent on color Doppler imaging with normal direction of blood flow towards the liver. Other: Incidental cyst of the RIGHT kidney  measuring up to 1.4 cm (for which no dedicated imaging follow-up is recommended) . IMPRESSION: Small amount of sludge versus nonshadowing stones in the gallbladder. No sonographic evidence of acute cholecystitis. Electronically Signed   By: Meda Klinefelter M.D.   On: 01/08/2024 18:29   DG Chest Port 1 View Result Date: 01/08/2024 CLINICAL DATA:  Shortness of breath. EXAM: PORTABLE CHEST 1 VIEW COMPARISON:  September 02, 2023. FINDINGS: The heart size and mediastinal contours are within normal limits. Both lungs are clear. The visualized skeletal structures are unremarkable. IMPRESSION: No active disease. Electronically Signed   By: Lupita Raider M.D.   On: 01/08/2024 17:59    Microbiology: Recent Results (from the past 240 hours)  Resp panel by RT-PCR (RSV, Flu Lorre Opdahl&B, Covid) Anterior Nasal Swab     Status: None   Collection Time: 01/08/24  5:35 PM   Specimen: Anterior Nasal Swab  Result Value Ref Range Status   SARS Coronavirus 2 by RT PCR NEGATIVE NEGATIVE Final    Comment: (NOTE) SARS-CoV-2 target nucleic acids are NOT DETECTED.  The SARS-CoV-2 RNA is generally detectable in upper respiratory specimens during the acute phase of infection. The lowest concentration of SARS-CoV-2 viral copies this assay can detect is 138 copies/mL. Cullan Launer negative result does not preclude SARS-Cov-2 infection and should not be used as the sole basis for treatment or other patient management decisions. Annell Canty negative  result may occur with  improper specimen collection/handling, submission of specimen other than nasopharyngeal swab, presence of viral mutation(s) within the areas targeted by this assay, and inadequate number of viral copies(<138 copies/mL). Atreyu Mak negative result must be combined with clinical observations, patient history, and epidemiological information. The expected result is Negative.  Fact Sheet for Patients:  BloggerCourse.com  Fact Sheet for Healthcare Providers:   SeriousBroker.it  This test is no t yet approved or cleared by the [place] FDA and  has been authorized for detection and/or diagnosis of SARS-CoV-2 by FDA under an Emergency Use Authorization (EUA). This EUA will remain  in effect (meaning this test can be used) for the duration of the COVID-19 declaration under Section 564(b)(1) of the Act, 21 U.S.C.section 360bbb-3(b)(1), unless the authorization is terminated  or revoked sooner.       Influenza Laverle Pillard by PCR NEGATIVE NEGATIVE Final   Influenza B by PCR NEGATIVE NEGATIVE Final    Comment: (NOTE) The Xpert Xpress SARS-CoV-2/FLU/RSV plus assay is intended as an aid in the diagnosis of influenza from Nasopharyngeal swab specimens and should not be used as Xzaviar Maloof sole basis for treatment. Nasal washings and aspirates are unacceptable for Xpert Xpress SARS-CoV-2/FLU/RSV testing.  Fact Sheet for Patients: BloggerCourse.com  Fact Sheet for Healthcare Providers: SeriousBroker.it  This test is not yet approved or cleared by the [place] FDA and has been authorized for detection and/or diagnosis of SARS-CoV-2 by FDA under an Emergency Use Authorization (EUA). This EUA will remain in effect (meaning this test can be used) for the duration of the COVID-19 declaration under Section 564(b)(1) of the Act, 21 U.S.C. section 360bbb-3(b)(1), unless the authorization is terminated or revoked.     Resp Syncytial Virus by PCR NEGATIVE NEGATIVE Final    Comment: (NOTE) Fact Sheet for Patients: BloggerCourse.com  Fact Sheet for Healthcare Providers: SeriousBroker.it  This test is not yet approved or cleared by the [place] FDA and has been authorized for detection and/or diagnosis of SARS-CoV-2 by FDA under an Emergency Use Authorization (EUA). This EUA will remain in effect (meaning this test can be used) for  the duration of the COVID-19 declaration under Section 564(b)(1) of the Act, 21 U.S.C. section 360bbb-3(b)(1), unless the authorization is terminated or revoked.  Performed at [facility], [address], [place], [place] [address]      Labs: Basic Metabolic Panel: Recent Labs  Lab 01/08/24 1735 01/09/24 0546 01/12/24 0516 01/13/24 0508  NA 143 144 142 140  K 3.6 4.1 4.4 3.7  CL 98 101 97* 97*  CO2 37* 34* 36* 37*  GLUCOSE 117* 127* 71 79  BUN 13 12 23  24*  CREATININE 0.97 0.91 0.86 0.95  CALCIUM 9.5 8.9 9.0 8.9  MG  --   --   --  2.1  PHOS  --   --   --  3.6   Liver Function Tests: Recent Labs  Lab 01/08/24 1735  AST 16  ALT 7  ALKPHOS 84  BILITOT 0.4  PROT 7.2  ALBUMIN 4.1   Recent Labs  Lab 01/08/24 1735  LIPASE 20   No results for input(s): "AMMONIA" in the last 168 hours. CBC: Recent Labs  Lab 01/08/24 1735 01/09/24 0546 01/13/24 0508  WBC 8.6 5.9 6.9  NEUTROABS 6.3  --   --   HGB 14.2 13.8 12.8  HCT 46.0 45.7 42.9  MCV 94.1 96.6 97.7  PLT 196 180 161   Cardiac Enzymes: No results for input(s): "CKTOTAL", "  CKMB", "CKMBINDEX", "TROPONINI" in the last 168 hours. BNP: BNP (last 3 results) Recent Labs    08/30/23 1000 01/08/24 1735  BNP 429.9* 205.7*    ProBNP (last 3 results) No results for input(s): "PROBNP" in the last 8760 hours.  CBG: No results for input(s): "GLUCAP" in the last 168 hours.     Signed:  Lacretia Nicks MD.  [facility] 01/13/2024, 12:43 PM

## 2024-01-13 NOTE — Care Management Important Message (Signed)
 Important Message  Patient Details IM Letter given. Name: Julia Hernandez MRN: [id number] Date of Birth: 12/04/1935   Important Message Given:  Yes - Medicare IM     Caren Macadam 01/13/2024, 2:37 PM

## 2024-01-13 NOTE — Progress Notes (Signed)
 Mobility Specialist - Progress Note   01/13/24 1028  Oxygen Therapy  O2 Device Nasal Cannula  O2 Flow Rate (L/min) 2 L/min  Mobility  Activity Ambulated with assistance in hallway  Level of Assistance Modified independent, requires aide device or extra time  Assistive Device Front wheel walker  Distance Ambulated (ft) 480 ft  Activity Response Tolerated well  Mobility Referral Yes  Mobility visit 1 Mobility  Mobility Specialist Start Time (ACUTE ONLY) 1011  Mobility Specialist Stop Time (ACUTE ONLY) 1028  Mobility Specialist Time Calculation (min) (ACUTE ONLY) 17 min   Pt received in recliner and agreeable to mobility. Unable to obtain SpO2 reading d/t malfunctioning pulse ox. No complaints during session. Pt to recliner after session with all needs met.    [facility]

## 2024-01-14 ENCOUNTER — Other Ambulatory Visit (HOSPITAL_COMMUNITY): Payer: Self-pay

## 2024-01-31 ENCOUNTER — Encounter (HOSPITAL_BASED_OUTPATIENT_CLINIC_OR_DEPARTMENT_OTHER): Payer: Self-pay

## 2024-02-18 ENCOUNTER — Emergency Department (HOSPITAL_COMMUNITY)

## 2024-02-18 ENCOUNTER — Other Ambulatory Visit: Payer: Self-pay

## 2024-02-18 ENCOUNTER — Emergency Department (HOSPITAL_COMMUNITY)
Admission: EM | Admit: 2024-02-18 | Discharge: 2024-02-18 | Disposition: A | Discharge status: Home / Self Care | Attending: Emergency Medicine | Admitting: Emergency Medicine

## 2024-02-18 ENCOUNTER — Encounter (HOSPITAL_COMMUNITY): Payer: Self-pay

## 2024-02-18 DIAGNOSIS — K529 Noninfective gastroenteritis and colitis, unspecified: Secondary | ICD-10-CM | POA: Diagnosis not present

## 2024-02-18 DIAGNOSIS — Z79899 Other long term (current) drug therapy: Secondary | ICD-10-CM | POA: Diagnosis not present

## 2024-02-18 DIAGNOSIS — I130 Hypertensive heart and chronic kidney disease with heart failure and stage 1 through stage 4 chronic kidney disease, or unspecified chronic kidney disease: Secondary | ICD-10-CM | POA: Insufficient documentation

## 2024-02-18 DIAGNOSIS — M5126 Other intervertebral disc displacement, lumbar region: Secondary | ICD-10-CM | POA: Diagnosis not present

## 2024-02-18 DIAGNOSIS — N183 Chronic kidney disease, stage 3 unspecified: Secondary | ICD-10-CM | POA: Diagnosis not present

## 2024-02-18 DIAGNOSIS — I509 Heart failure, unspecified: Secondary | ICD-10-CM | POA: Diagnosis not present

## 2024-02-18 DIAGNOSIS — J449 Chronic obstructive pulmonary disease, unspecified: Secondary | ICD-10-CM | POA: Diagnosis not present

## 2024-02-18 DIAGNOSIS — M545 Low back pain, unspecified: Secondary | ICD-10-CM | POA: Diagnosis present

## 2024-02-18 DIAGNOSIS — M51369 Other intervertebral disc degeneration, lumbar region without mention of lumbar back pain or lower extremity pain: Secondary | ICD-10-CM

## 2024-02-18 LAB — CBC WITH DIFFERENTIAL/PLATELET
Abs Immature Granulocytes: 0.01 10*3/uL (ref 0.00–0.07)
Basophils Absolute: 0 10*3/uL (ref 0.0–0.1)
Basophils Relative: 0 %
Eosinophils Absolute: 0 10*3/uL (ref 0.0–0.5)
Eosinophils Relative: 1 %
HCT: 46.6 % — ABNORMAL HIGH (ref 36.0–46.0)
Hemoglobin: 15 g/dL (ref 12.0–15.0)
Immature Granulocytes: 0 %
Lymphocytes Relative: 18 %
Lymphs Abs: 1.4 10*3/uL (ref 0.7–4.0)
MCH: 29.6 pg (ref 26.0–34.0)
MCHC: 32.2 g/dL (ref 30.0–36.0)
MCV: 91.9 fL (ref 80.0–100.0)
Monocytes Absolute: 0.5 10*3/uL (ref 0.1–1.0)
Monocytes Relative: 6 %
Neutro Abs: 6 10*3/uL (ref 1.7–7.7)
Neutrophils Relative %: 75 %
Platelets: 204 10*3/uL (ref 150–400)
RBC: 5.07 MIL/uL (ref 3.87–5.11)
RDW: 13.2 % (ref 11.5–15.5)
WBC: 7.9 10*3/uL (ref 4.0–10.5)
nRBC: 0 % (ref 0.0–0.2)

## 2024-02-18 LAB — URINALYSIS, ROUTINE W REFLEX MICROSCOPIC
Bacteria, UA: NONE SEEN
Bilirubin Urine: NEGATIVE
Glucose, UA: NEGATIVE mg/dL
Ketones, ur: 5 mg/dL — AB
Leukocytes,Ua: NEGATIVE
Nitrite: NEGATIVE
Protein, ur: NEGATIVE mg/dL
Specific Gravity, Urine: 1.02 (ref 1.005–1.030)
pH: 5 (ref 5.0–8.0)

## 2024-02-18 LAB — I-STAT CHEM 8, ED
BUN: 13 mg/dL (ref 8–23)
Calcium, Ion: 1.2 mmol/L (ref 1.15–1.40)
Chloride: 101 mmol/L (ref 98–111)
Creatinine, Ser: 1 mg/dL (ref 0.44–1.00)
Glucose, Bld: 95 mg/dL (ref 70–99)
HCT: 43 % (ref 36.0–46.0)
Hemoglobin: 14.6 g/dL (ref 12.0–15.0)
Potassium: 4 mmol/L (ref 3.5–5.1)
Sodium: 142 mmol/L (ref 135–145)
TCO2: 31 mmol/L (ref 22–32)

## 2024-02-18 LAB — COMPREHENSIVE METABOLIC PANEL WITH GFR
ALT: 19 U/L (ref 0–44)
AST: 22 U/L (ref 15–41)
Albumin: 4.4 g/dL (ref 3.5–5.0)
Alkaline Phosphatase: 99 U/L (ref 38–126)
Anion gap: 12 (ref 5–15)
BUN: 14 mg/dL (ref 8–23)
CO2: 29 mmol/L (ref 22–32)
Calcium: 9.6 mg/dL (ref 8.9–10.3)
Chloride: 99 mmol/L (ref 98–111)
Creatinine, Ser: 0.71 mg/dL (ref 0.44–1.00)
GFR, Estimated: >60 mL/min (ref >60)
Glucose, Bld: 92 mg/dL (ref 70–99)
Potassium: 3.7 mmol/L (ref 3.5–5.1)
Sodium: 140 mmol/L (ref 135–145)
Total Bilirubin: 0.9 mg/dL (ref 0.0–1.2)
Total Protein: 8.3 g/dL — ABNORMAL HIGH (ref 6.5–8.1)

## 2024-02-18 MED ORDER — METOPROLOL TARTRATE 25 MG PO TABS
50.0000 mg | ORAL_TABLET | Freq: Once | ORAL | Status: AC
  Administered 2024-02-18: 50 mg via ORAL
  Filled 2024-02-18: qty 2

## 2024-02-18 MED ORDER — AMOXICILLIN-POT CLAVULANATE 875-125 MG PO TABS
1.0000 | ORAL_TABLET | Freq: Once | ORAL | Status: AC
  Administered 2024-02-18: 1 via ORAL
  Filled 2024-02-18: qty 1

## 2024-02-18 MED ORDER — IOHEXOL 300 MG/ML  SOLN
100.0000 mL | Freq: Once | INTRAMUSCULAR | Status: AC | PRN
  Administered 2024-02-18: 100 mL via INTRAVENOUS

## 2024-02-18 MED ORDER — OXYCODONE-ACETAMINOPHEN 5-325 MG PO TABS: 1.0000 | ORAL_TABLET | Freq: Four times a day (QID) | ORAL | 0 refills | Status: AC | PRN

## 2024-02-18 MED ORDER — OXYCODONE-ACETAMINOPHEN 5-325 MG PO TABS
1.0000 | ORAL_TABLET | Freq: Once | ORAL | Status: AC
  Administered 2024-02-18: 1 via ORAL
  Filled 2024-02-18: qty 1

## 2024-02-18 MED ORDER — AMOXICILLIN-POT CLAVULANATE 875-125 MG PO TABS: 1.0000 | ORAL_TABLET | Freq: Two times a day (BID) | ORAL | 0 refills | Status: AC

## 2024-02-18 NOTE — ED Provider Notes (Signed)
 [place] EMERGENCY DEPARTMENT AT [facility] Provider Note   CSN: [id number] Arrival date & time: 02/18/24  1140     History  Chief Complaint  Patient presents with   Back Pain    Lower    Julia Hernandez is a 88 y.o. female with a history of COPD, CHF, hypertension, and stage III CKD presents the ED today via EMS for back pain.  Patient reports lower back pain for the past week that radiates to the left lower quadrant.  Patient's been taking Tylenol  for pain without improvement.  She denies any fevers.  States that she has burning sensation when urine touches her skin.  She was given an ointment last time she was admitted for this with improvement of symptoms.  States that she has not been eating or drinking as much second to the pain.  Denies any vomiting but had diarrhea about 3 days ago.  Denies any chest pain or shortness of breath at this time.  She did not take any of her home medications prior to arrival.  No additional complaints or concerns at this time.    Home Medications Prior to Admission medications   Medication Sig Start Date End Date Taking? Authorizing Provider  amoxicillin-clavulanate (AUGMENTIN) 875-125 MG tablet Take 1 tablet by mouth every 12 (twelve) hours for 7 days. 02/18/24 02/25/24 Yes Sonnie Dusky, PA-C  oxyCODONE -acetaminophen  (PERCOCET/ROXICET) 5-325 MG tablet Take 1 tablet by mouth every 6 (six) hours as needed for up to 3 days for severe pain (pain score 7-10). 02/18/24 02/21/24 Yes Sonnie Dusky, PA-C  acetaminophen  (TYLENOL ) 500 MG tablet Take 500 mg by mouth every 6 (six) hours as needed for moderate pain (pain score 4-6).    [provider]  acyclovir ointment (ZOVIRAX) 5 % Apply 1 application topically 3 (three) times daily as needed (outbreaks).    [provider]  aspirin  EC 81 MG tablet Take 1 tablet (81 mg total) by mouth daily. Swallow whole. 03/31/22   Patel, Pranav M, MD  B Complex-C (B-COMPLEX WITH VITAMIN C) tablet Take 1  tablet by mouth daily. 03/31/22   Kraig Peru, MD  diazepam  (VALIUM ) 5 MG tablet Take 0.5 tablets (2.5 mg total) by mouth at bedtime. 01/13/24   Etter Hermann., MD  ferrous sulfate  325 (65 FE) MG tablet Take 1 tablet (325 mg total) by mouth daily with breakfast. 09/04/23   Samtani, Jai-Gurmukh, MD  furosemide  (LASIX ) 20 MG tablet Take 1 tablet (20 mg total) by mouth daily as needed (weight gain of >3 lbs in 1 day or >5 lbs in 1 week). 03/30/22   Kraig Peru, MD  Ipratropium-Albuterol  (COMBIVENT  RESPIMAT) 20-100 MCG/ACT AERS respimat Inhale 1 puff into the lungs 4 (four) times daily as needed for shortness of breath. 01/13/24 03/13/24  Etter Hermann., MD  losartan  (COZAAR ) 25 MG tablet Take 0.5 tablets (12.5 mg total) by mouth daily. 09/24/23   Arleene Belt, PA-C  magic mouthwash w/lidocaine  SOLN Take 5 mLs by mouth 4 (four) times daily as needed for mouth pain.    [provider]  metoprolol  succinate (TOPROL -XL) 25 MG 24 hr tablet Take 50 mg by mouth daily. 12/28/23   [provider]  pantoprazole  (PROTONIX ) 40 MG tablet Take 1 tablet (40 mg total) by mouth daily. 03/31/22   Patel, Pranav M, MD  rosuvastatin (CRESTOR) 5 MG tablet Take 5 mg by mouth every other day. 12/28/23   [provider]  sertraline  (ZOLOFT )  100 MG tablet Take 100 mg by mouth at bedtime.    [provider]  spironolactone  (ALDACTONE ) 25 MG tablet Take 1 tablet (25 mg total) by mouth daily. 09/24/23   Arleene Belt, PA-C  SYMBICORT  160-4.5 MCG/ACT inhaler Inhale 2 puffs into the lungs 2 (two) times daily. 01/13/24   Etter Hermann., MD      Allergies    Influenza vac split quad, Ditropan xl [oxybutynin], and Simvastatin    Review of Systems   Review of Systems  Musculoskeletal:  Positive for back pain.  All other systems reviewed and are negative.   Physical Exam Updated Vital Signs BP (!) 196/80 (BP Location: Left Arm)   Pulse 70   Temp 97.6 F  (36.4 C) (Oral)   Resp (!) 21   Ht 4\' 8"  (1.422 m)   Wt 37.6 kg   SpO2 98%   BMI 18.61 kg/m  Physical Exam Vitals and nursing note reviewed.  Constitutional:      General: She is not in acute distress.    Appearance: Normal appearance.  HENT:     Head: Normocephalic and atraumatic.     Mouth/Throat:     Mouth: Mucous membranes are moist.  Eyes:     Conjunctiva/sclera: Conjunctivae normal.     Pupils: Pupils are equal, round, and reactive to light.  Cardiovascular:     Rate and Rhythm: Normal rate and regular rhythm.     Pulses: Normal pulses.     Heart sounds: Normal heart sounds.  Pulmonary:     Effort: Pulmonary effort is normal.     Breath sounds: Normal breath sounds.  Abdominal:     Palpations: Abdomen is soft.     Tenderness: There is abdominal tenderness. There is left CVA tenderness. There is no right CVA tenderness.     Comments: LLQ tenderness without guarding or rebound and left flank tenderness  Musculoskeletal:        General: Normal range of motion.     Cervical back: Normal range of motion. No tenderness.     Right lower leg: No edema.     Left lower leg: No edema.     Comments: Midline lumbar tenderness to palpation without obvious deformity or step-off.  Strength, sensation, and strength of upper and lower extremities intact bilaterally.  Skin:    General: Skin is warm and dry.     Findings: No rash.  Neurological:     General: No focal deficit present.     Mental Status: She is alert.     Sensory: No sensory deficit.     Motor: No weakness.  Psychiatric:        Mood and Affect: Mood normal.        Behavior: Behavior normal.     ED Results / Procedures / Treatments   Labs (all labs ordered are listed, but only abnormal results are displayed) Labs Reviewed  COMPREHENSIVE METABOLIC PANEL WITH GFR - Abnormal; Notable for the following components:      Result Value   Total Protein 8.3 (*)    All other components within normal limits  CBC WITH  DIFFERENTIAL/PLATELET - Abnormal; Notable for the following components:   HCT 46.6 (*)    All other components within normal limits  URINALYSIS, ROUTINE W REFLEX MICROSCOPIC - Abnormal; Notable for the following components:   Hgb urine dipstick SMALL (*)    Ketones, ur 5 (*)    All other components within normal limits  I-STAT  CHEM 8, ED    EKG None  Radiology CT ABDOMEN PELVIS W CONTRAST Result Date: 02/18/2024 CLINICAL DATA:  LLQ abdominal pain EXAM: CT ABDOMEN AND PELVIS WITH CONTRAST TECHNIQUE: Multidetector CT imaging of the abdomen and pelvis was performed using the standard protocol following bolus administration of intravenous contrast. RADIATION DOSE REDUCTION: This exam was performed according to the departmental dose-optimization program which includes automated exposure control, adjustment of the mA and/or kV according to patient size and/or use of iterative reconstruction technique. CONTRAST:  OMNIPAQUE  IOHEXOL  300 MG/ML  SOLN COMPARISON:  January 08, 2024 FINDINGS: Lower chest: No focal airspace consolidation or pleural effusion.Scattered fibrolinear scarring in the lung bases. Dense multi-vessel coronary atherosclerosis. Hepatobiliary: No mass. No radiopaque stones or wall thickening of the gallbladder. No intrahepatic or extrahepatic biliary ductal dilation. The portal veins are patent. Pancreas: No mass or main ductal dilation. No peripancreatic inflammation or fluid collection. Spleen: Normal size. No mass. Adrenals/Urinary Tract: No adrenal masses. Right interpolar region cyst. No renal mass. Multiple small nonobstructive calyceal calculi on the left. No hydronephrosis. The urinary bladder is distended without focal abnormality. Stomach/Bowel: The stomach is decompressed without focal abnormality. No small bowel wall thickening or inflammation. No small bowel obstruction.Normal appendix. Mild wall thickening of the rectosigmoid colon. Vascular/Lymphatic: No aortic aneurysm.  Diffuse aortoiliac atherosclerosis. No intraabdominal or pelvic lymphadenopathy. Reproductive: The uterus and left ovary are again not well visualized or evaluated. Unchanged right ovarian cyst measuring 1.9 cm. No free pelvic fluid. Other: No pneumoperitoneum, ascites, or mesenteric inflammation. Musculoskeletal: Worsening compression deformities of the L1 and L4 vertebral bodies. Osteopenia. Multilevel degenerative disc disease of the spine. IMPRESSION: 1. Mild wall thickening of the rectosigmoid colon, which may be due to underdistention versus an infectious or inflammatory proctocolitis. 2. Worsening compression deformities of the L1 and L4 vertebral bodies. Please see the separately dictated lumbar spine CT report for further characterization. 3. Nonobstructive left nephrolithiasis.  No hydronephrosis. Electronically Signed   By: Rance Burrows M.D.   On: 02/18/2024 17:49   CT L-SPINE NO CHARGE Result Date: 02/18/2024 CLINICAL DATA:  Low back pain. EXAM: CT LUMBAR SPINE WITHOUT CONTRAST TECHNIQUE: Multidetector CT imaging of the lumbar spine was performed without intravenous contrast administration. Multiplanar CT image reconstructions were also generated. RADIATION DOSE REDUCTION: This exam was performed according to the departmental dose-optimization program which includes automated exposure control, adjustment of the mA and/or kV according to patient size and/or use of iterative reconstruction technique. COMPARISON:  Reformats from abdominopelvic CT 01/08/2024 FINDINGS: Segmentation: 5 lumbar type vertebrae. Alignment: 4 mm anterolisthesis of L5 on S1. Vertebrae: L4 compression deformity with 50% loss of height centrally is new from march exam, primarily involving the inferior endplate. Chronic and unchanged appearance of L1 and L2 compression deformities. The bones are subjectively under mineralized. Paraspinal and other soft tissues: Please refer to concurrently performed abdominopelvic CT for soft  tissue findings. Disc levels: Broad-based disc bulge at multiple levels with varying degree of neural foraminal stenosis. No high-grade spinal canal stenosis. There is moderate L5-S1 facet hypertrophy. IMPRESSION: 1. L4 compression deformity with 50% loss of height centrally is new from March exam, primarily involving the inferior endplate. 2. Chronic and unchanged appearance of L1 and L2 compression deformities. 3. Multilevel degenerative disc disease and facet hypertrophy. Electronically Signed   By: Chadwick Colonel M.D.   On: 02/18/2024 17:44    Procedures Procedures    Medications Ordered in ED Medications  metoprolol  tartrate (LOPRESSOR ) tablet 50 mg (50 mg Oral  Given 02/18/24 1254)  oxyCODONE -acetaminophen  (PERCOCET/ROXICET) 5-325 MG per tablet 1 tablet (1 tablet Oral Given 02/18/24 1254)  iohexol  (OMNIPAQUE ) 300 MG/ML solution 100 mL (100 mLs Intravenous Contrast Given 02/18/24 1538)  amoxicillin-clavulanate (AUGMENTIN) 875-125 MG per tablet 1 tablet (1 tablet Oral Given 02/18/24 1840)  oxyCODONE -acetaminophen  (PERCOCET/ROXICET) 5-325 MG per tablet 1 tablet (1 tablet Oral Given 02/18/24 1840)    ED Course/ Medical Decision Making/ A&P                                 Medical Decision Making Amount and/or Complexity of Data Reviewed Labs: ordered. Radiology: ordered.  Risk Prescription drug management.   This patient presents to the ED for concern of abdominal and back pain, this involves an extensive number of treatment options, and is a complaint that carries with it a high risk of complications and morbidity.   Differential diagnosis includes: UTI, pyelonephritis, kidney stone, radiculopathy, sciatic nerve pain, diverticulitis, IBS, IBD, gastroenteritis, etc.   Comorbidities  See HPI above   Additional History  Additional history obtained from prior   Lab Tests  I ordered and personally interpreted labs.  The pertinent results include:   CMP and CBC are reassuring no  signs of acute infection, lecture abnormality, or AKI UA shows no signs of infection   Imaging Studies  I ordered imaging studies including CT abdomen/pelvis, CT lumbar spine add on  I independently visualized and interpreted imaging which showed:  CT abdomen shows mild wall thickening of the rectosigmoid colon, may be due to underdistention versus infectious/inflammatory proctocolitis Compression deformities of L1-L4 vertebral bodies I agree with the radiologist interpretation   Problem List / ED Course / Critical Interventions / Medication Management  Patient reports acute on chronic low back pain that radiates to the left flank into the left lower quadrant.  Was having some diarrhea several days ago but has not had any in 3 days.  No dysuria or fevers.  No nausea or vomiting. I ordered medications including: Percocet for pain  Metoprolol  for elevated blood pressure First dose antibiotics given in the ED tonight Reevaluation of the patient after these medicines showed that the patient improved.  Blood pressure improved to the 170s systolically.  No chest pain or shortness of breath with hypertension.  Patient not take any of her blood pressure medications prior to arrival. I have reviewed the patients home medicines and have made adjustments as needed   Social Determinants of Health  Tobacco use   Test / Admission - Considered  Discussed findings with patient.  All questions were answered. She is stable and safe discharge home. Return precautions given.       Final Clinical Impression(s) / ED Diagnoses Final diagnoses:  Colitis  Bulging lumbar disc    Rx / DC Orders ED Discharge Orders          Ordered    amoxicillin-clavulanate (AUGMENTIN) 875-125 MG tablet  Every 12 hours        02/18/24 1835    oxyCODONE -acetaminophen  (PERCOCET/ROXICET) 5-325 MG tablet  Every 6 hours PRN        02/18/24 1906              Sonnie Dusky, PA-C 02/18/24 2008     Cheyenne Cotta, MD 02/19/24 1723

## 2024-02-18 NOTE — Discharge Instructions (Addendum)
 As discussed, you have colitis or inflammation of the colon.  Take Augmentin twice a day for the next week.  It takes about 2 to 3 days for abdominal pain.  Take Percocet every 6 hours as needed for back pain.  Follow-up with your primary care in the next week for reevaluation of your symptoms.  Get help right away if: You have a fever that does not go away with treatment. You get chills. You faint or you become very weak. You lose too much fluid in your body. Symptoms include: Dark pee, very little pee, or no pee. Cracked lips or dry mouth. Sleepiness or weakness. You throw up again and again. You have very bad pain in your belly. You have blood in your poop, or you have tarry poop.

## 2024-02-18 NOTE — ED Triage Notes (Signed)
 Pt BIB EMS from Home due to low back pain that radiates down to her groin for about a month that has been getting progressively worse; Pt reports today has been the worst. Pt reports urinary incontinence and burning pain when urinating. Pt reports not taking all her morning meds today. AAOx4  BP 190/108 HR 80 RR 36 SpO2 94% RA

## 2024-03-23 ENCOUNTER — Telehealth: Payer: Self-pay

## 2024-03-23 DIAGNOSIS — J449 Chronic obstructive pulmonary disease, unspecified: Secondary | ICD-10-CM

## 2024-04-04 ENCOUNTER — Other Ambulatory Visit (HOSPITAL_BASED_OUTPATIENT_CLINIC_OR_DEPARTMENT_OTHER): Payer: Self-pay | Admitting: Family Medicine

## 2024-04-04 DIAGNOSIS — M810 Age-related osteoporosis without current pathological fracture: Secondary | ICD-10-CM

## 2024-04-15 ENCOUNTER — Emergency Department (HOSPITAL_COMMUNITY)
Admission: EM | Admit: 2024-04-15 | Discharge: 2024-04-16 | Disposition: A | Discharge status: Home / Self Care | Source: Intra-hospital | Attending: Emergency Medicine | Admitting: Emergency Medicine

## 2024-04-15 ENCOUNTER — Emergency Department (HOSPITAL_COMMUNITY)

## 2024-04-15 DIAGNOSIS — I2510 Atherosclerotic heart disease of native coronary artery without angina pectoris: Secondary | ICD-10-CM | POA: Diagnosis not present

## 2024-04-15 DIAGNOSIS — Z79899 Other long term (current) drug therapy: Secondary | ICD-10-CM | POA: Insufficient documentation

## 2024-04-15 DIAGNOSIS — Z7951 Long term (current) use of inhaled steroids: Secondary | ICD-10-CM | POA: Diagnosis not present

## 2024-04-15 DIAGNOSIS — I129 Hypertensive chronic kidney disease with stage 1 through stage 4 chronic kidney disease, or unspecified chronic kidney disease: Secondary | ICD-10-CM | POA: Insufficient documentation

## 2024-04-15 DIAGNOSIS — J449 Chronic obstructive pulmonary disease, unspecified: Secondary | ICD-10-CM | POA: Insufficient documentation

## 2024-04-15 DIAGNOSIS — N189 Chronic kidney disease, unspecified: Secondary | ICD-10-CM | POA: Insufficient documentation

## 2024-04-15 DIAGNOSIS — Z7982 Long term (current) use of aspirin: Secondary | ICD-10-CM | POA: Diagnosis not present

## 2024-04-15 DIAGNOSIS — M7989 Other specified soft tissue disorders: Secondary | ICD-10-CM | POA: Diagnosis present

## 2024-04-15 LAB — CBC WITH DIFFERENTIAL/PLATELET
Abs Immature Granulocytes: 0.02 K/uL (ref 0.00–0.07)
Basophils Absolute: 0 K/uL (ref 0.0–0.1)
Basophils Relative: 0 %
Eosinophils Absolute: 0.3 K/uL (ref 0.0–0.5)
Eosinophils Relative: 3 %
HCT: 42.7 % (ref 36.0–46.0)
Hemoglobin: 13.2 g/dL (ref 12.0–15.0)
Immature Granulocytes: 0 %
Lymphocytes Relative: 18 %
Lymphs Abs: 1.8 K/uL (ref 0.7–4.0)
MCH: 29.5 pg (ref 26.0–34.0)
MCHC: 30.9 g/dL (ref 30.0–36.0)
MCV: 95.3 fL (ref 80.0–100.0)
Monocytes Absolute: 0.7 K/uL (ref 0.1–1.0)
Monocytes Relative: 7 %
Neutro Abs: 7 K/uL (ref 1.7–7.7)
Neutrophils Relative %: 72 %
Platelets: 206 K/uL (ref 150–400)
RBC: 4.48 MIL/uL (ref 3.87–5.11)
RDW: 14.1 % (ref 11.5–15.5)
WBC: 9.8 K/uL (ref 4.0–10.5)
nRBC: 0 % (ref 0.0–0.2)

## 2024-04-15 LAB — COMPREHENSIVE METABOLIC PANEL WITH GFR
ALT: 16 U/L (ref 0–44)
AST: 22 U/L (ref 15–41)
Albumin: 3.8 g/dL (ref 3.5–5.0)
Alkaline Phosphatase: 86 U/L (ref 38–126)
Anion gap: 12 (ref 5–15)
BUN: 23 mg/dL (ref 8–23)
CO2: 30 mmol/L (ref 22–32)
Calcium: 9.3 mg/dL (ref 8.9–10.3)
Chloride: 100 mmol/L (ref 98–111)
Creatinine, Ser: 1.06 mg/dL — ABNORMAL HIGH (ref 0.44–1.00)
GFR, Estimated: 51 mL/min — ABNORMAL LOW (ref >60)
Glucose, Bld: 106 mg/dL — ABNORMAL HIGH (ref 70–99)
Potassium: 4.1 mmol/L (ref 3.5–5.1)
Sodium: 142 mmol/L (ref 135–145)
Total Bilirubin: 0.7 mg/dL (ref 0.0–1.2)
Total Protein: 7.2 g/dL (ref 6.5–8.1)

## 2024-04-15 LAB — MAGNESIUM: Magnesium: 1.9 mg/dL (ref 1.7–2.4)

## 2024-04-15 LAB — LACTIC ACID, PLASMA: Lactic Acid, Venous: 1 mmol/L (ref 0.5–1.9)

## 2024-04-15 LAB — PROTIME-INR
INR: 1 (ref 0.8–1.2)
Prothrombin Time: 14.2 s (ref 11.4–15.2)

## 2024-04-15 MED ORDER — IOHEXOL 350 MG/ML SOLN
75.0000 mL | Freq: Once | INTRAVENOUS | Status: AC | PRN
  Administered 2024-04-15: 75 mL via INTRAVENOUS

## 2024-04-15 NOTE — ED Provider Notes (Signed)
 [place] EMERGENCY DEPARTMENT AT [facility] Provider Note   CSN: [id number] Arrival date & time: 04/15/24  1724     Patient presents with: Leg Swelling   Julia Hernandez is a 88 y.o. female.   HPI Patient presents for right leg pain and swelling.  Medical history includes HTN, COPD, CKD, HLD, anxiety, depression, CAD.  For the past several weeks, she has had numbness in her bilateral distal lower extremities in addition to swelling of distal right lower extremity.  This worsens when her leg is lowered and improves with elevation.  She has been taking Lasix  every day which she feels improves the swelling.  She has required use of a cane to ambulate due to the numbness.  She was visited by her neighbors today who noticed that her foot looked black.  They told her to call 911 and she did.  EMS also noted that she had significant discoloration to her right foot when they arrived.  At the time, she was sitting upright in a chair.  As she was on the stretcher with her leg elevated, discoloration improved.  Currently, she denies any significant pain.  She does note some ongoing numbness to her distal lower extremities.    Prior to Admission medications   Medication Sig Start Date End Date Taking? Authorizing Provider  acetaminophen  (TYLENOL ) 500 MG tablet Take 500 mg by mouth every 6 (six) hours as needed for moderate pain (pain score 4-6).    [provider]  acyclovir ointment (ZOVIRAX) 5 % Apply 1 application topically 3 (three) times daily as needed (outbreaks).    [provider]  aspirin  EC 81 MG tablet Take 1 tablet (81 mg total) by mouth daily. Swallow whole. 03/31/22   Patel, Pranav M, MD  B Complex-C (B-COMPLEX WITH VITAMIN C) tablet Take 1 tablet by mouth daily. 03/31/22   Tobie Yetta HERO, MD  diazepam  (VALIUM ) 5 MG tablet Take 0.5 tablets (2.5 mg total) by mouth at bedtime. 01/13/24   Perri DELENA Meliton Mickey., MD  ferrous sulfate  325 (65 FE) MG tablet Take 1 tablet  (325 mg total) by mouth daily with breakfast. 09/04/23   Samtani, Jai-Gurmukh, MD  furosemide  (LASIX ) 20 MG tablet Take 1 tablet (20 mg total) by mouth daily as needed (weight gain of >3 lbs in 1 day or >5 lbs in 1 week). 03/30/22   Tobie Yetta HERO, MD  Ipratropium-Albuterol  (COMBIVENT  RESPIMAT) 20-100 MCG/ACT AERS respimat Inhale 1 puff into the lungs 4 (four) times daily as needed for shortness of breath. 01/13/24 03/13/24  Perri DELENA Meliton Mickey., MD  losartan  (COZAAR ) 25 MG tablet Take 0.5 tablets (12.5 mg total) by mouth daily. 09/24/23   Colletta Manuelita Garre, PA-C  magic mouthwash w/lidocaine  SOLN Take 5 mLs by mouth 4 (four) times daily as needed for mouth pain.    [provider]  metoprolol  succinate (TOPROL -XL) 25 MG 24 hr tablet Take 50 mg by mouth daily. 12/28/23   [provider]  pantoprazole  (PROTONIX ) 40 MG tablet Take 1 tablet (40 mg total) by mouth daily. 03/31/22   Patel, Pranav M, MD  rosuvastatin (CRESTOR) 5 MG tablet Take 5 mg by mouth every other day. 12/28/23   [provider]  sertraline  (ZOLOFT ) 100 MG tablet Take 100 mg by mouth at bedtime.    [provider]  spironolactone  (ALDACTONE ) 25 MG tablet Take 1 tablet (25 mg total) by mouth daily. 09/24/23   Colletta Manuelita Garre, PA-C  SYMBICORT  160-4.5 MCG/ACT  inhaler Inhale 2 puffs into the lungs 2 (two) times daily. 01/13/24   Perri DELENA Meliton Mickey., MD    Allergies: Influenza vac split quad, Ditropan xl [oxybutynin], and Simvastatin    Review of Systems  Cardiovascular:  Positive for leg swelling.  Musculoskeletal:  Positive for gait problem.  Neurological:  Positive for numbness.  All other systems reviewed and are negative.   Updated Vital Signs BP (!) 148/71   Pulse 69   Temp 98.6 F (37 C) (Oral)   Resp 19   Ht 4' 6 (1.372 m)   Wt 37.6 kg   SpO2 92%   BMI 20.01 kg/m   Physical Exam Vitals and nursing note reviewed.  Constitutional:      General: She is not in acute  distress.    Appearance: Normal appearance. She is well-developed. She is not ill-appearing, toxic-appearing or diaphoretic.  HENT:     Head: Normocephalic and atraumatic.     Right Ear: External ear normal.     Left Ear: External ear normal.     Nose: Nose normal.     Mouth/Throat:     Mouth: Mucous membranes are moist.  Eyes:     Extraocular Movements: Extraocular movements intact.     Conjunctiva/sclera: Conjunctivae normal.  Cardiovascular:     Rate and Rhythm: Normal rate and regular rhythm.     Comments: DP and PT pulses present bilaterally, identified with Doppler.  Faint pulses noted in LLE Pulmonary:     Effort: Pulmonary effort is normal. No respiratory distress.  Abdominal:     General: There is no distension.     Palpations: Abdomen is soft.     Tenderness: There is no abdominal tenderness.  Musculoskeletal:        General: No swelling. Normal range of motion.     Cervical back: Normal range of motion and neck supple.  Skin:    General: Skin is warm and dry.     Coloration: Skin is not jaundiced or pale.  Neurological:     General: No focal deficit present.     Mental Status: She is alert and oriented to person, place, and time.  Psychiatric:        Mood and Affect: Mood normal.        Behavior: Behavior normal.     (all labs ordered are listed, but only abnormal results are displayed) Labs Reviewed  COMPREHENSIVE METABOLIC PANEL WITH GFR - Abnormal; Notable for the following components:      Result Value   Glucose, Bld 106 (*)    Creatinine, Ser 1.06 (*)    GFR, Estimated 51 (*)    All other components within normal limits  CBC WITH DIFFERENTIAL/PLATELET  PROTIME-INR  MAGNESIUM   LACTIC ACID, PLASMA    EKG: None  Radiology: No results found.   Procedures   Medications Ordered in the ED  iohexol  (OMNIPAQUE ) 350 MG/ML injection 75 mL (75 mLs Intravenous Contrast Given 04/15/24 2049)                                    Medical Decision  Making Amount and/or Complexity of Data Reviewed Labs: ordered. Radiology: ordered.  Risk Prescription drug management.   This patient presents to the ED for concern of RLE swelling and discoloration, this involves an extensive number of treatment options, and is a complaint that carries with it a high risk of complications and  morbidity.  The differential diagnosis includes, PAD,   Co morbidities / Chronic conditions that complicate the patient evaluation  HTN, COPD, CKD, HLD, anxiety, depression, CAD   Additional history obtained:  Additional history obtained from EMR External records from outside source obtained and reviewed including EMS   Lab Tests:  I Ordered, and personally interpreted labs.  The pertinent results include: Normal kidney function, normal electrolytes, normal hemoglobin, no leukocytosis   Imaging Studies ordered:  I ordered imaging studies including CTA with runoff, RLE DVT study I independently visualized and interpreted imaging which showed (pending at time of signout) I agree with the radiologist interpretation   Cardiac Monitoring: / EKG:  The patient was maintained on a cardiac monitor.  I personally viewed and interpreted the cardiac monitored which showed an underlying rhythm of: Sinus rhythm   Problem List / ED Course / Critical interventions / Medication management  Patient presents for distal BLE numbness in addition to RLE swelling.  She has noted discoloration that seems to be more prominent when she places her RLE in gravity dependent position.  On arrival in the ED, she is resting on EMS stretcher.  She has some mild erythema and swelling to her distal RLE.  Strong pulses are present with Doppler.  On examination of pulses of her left lower extremity, these are more faint.  She states that she has numbness in both.  She does have a long smoking history and PAD is suspected.  CTA with runoff was ordered.  This study was pending at time of  signout.  Care of patient was signed out to oncoming ED provider.   Social Determinants of Health:  Lives independently     Final diagnoses:  Leg swelling    ED Discharge Orders     None          Melvenia Motto, MD 04/15/24 2057

## 2024-04-15 NOTE — ED Triage Notes (Signed)
 Patient BIB [facility] from home for right leg swelling x 3 weeks, cold and purple, cap refill 6s when dependent, red and warm, cap refill 4s, when elevated. EMS reports defined line where leg becomes hard and swollen. EMS reports pulses are decreased and marked. A&Ox4, ambulatory at baseline.  BP 144/68 HR 72 RR 17 93% RA

## 2024-04-15 NOTE — ED Provider Notes (Signed)
 CTA runoff of lower extremity for asymetric swelling, numbness and leg darker when dependent. Physical Exam  BP (!) 148/71   Pulse 69   Temp 98.6 F (37 C) (Oral)   Resp 19   Ht 4' 6 (1.372 m)   Wt 37.6 kg   SpO2 92%   BMI 20.01 kg/m   Physical Exam  Procedures  Procedures  ED Course / MDM    Medical Decision Making Amount and/or Complexity of Data Reviewed Labs: ordered. Radiology: ordered.  Risk Prescription drug management.   CT angiogram results do not show any occlusions or significant stenosis.  On physical exam, patient does have asymmetric lower extremity edema.  Distal pulses are intact bilaterally and palpable.  Both feet are warm and dry.  At this time high suspicion for DVT based on clinical evaluation.  DVT study was unavailable due to limited hours.  At this time we will administer Lovenox  and to have the patient get DVT study in the a.m.       Armenta Canning, MD 04/16/24 [phone]

## 2024-04-16 ENCOUNTER — Emergency Department (HOSPITAL_COMMUNITY)

## 2024-04-16 DIAGNOSIS — M7989 Other specified soft tissue disorders: Secondary | ICD-10-CM | POA: Diagnosis not present

## 2024-04-16 MED ORDER — DIAZEPAM 5 MG PO TABS
2.5000 mg | ORAL_TABLET | Freq: Once | ORAL | Status: AC
  Administered 2024-04-16: 2.5 mg via ORAL
  Filled 2024-04-16: qty 1

## 2024-04-16 MED ORDER — ENOXAPARIN SODIUM 40 MG/0.4ML IJ SOSY
40.0000 mg | PREFILLED_SYRINGE | Freq: Once | INTRAMUSCULAR | Status: AC
  Administered 2024-04-16: 40 mg via SUBCUTANEOUS
  Filled 2024-04-16: qty 0.4

## 2024-04-16 MED ORDER — DIAZEPAM 2 MG PO TABS
1.0000 mg | ORAL_TABLET | Freq: Once | ORAL | Status: AC
  Administered 2024-04-16: 1 mg via ORAL
  Filled 2024-04-16: qty 1

## 2024-04-16 NOTE — ED Provider Notes (Signed)
  Physical Exam  BP (!) 147/78   Pulse 100   Temp 99 F (37.2 C) (Oral)   Resp (!) 24   Ht 4' 6 (1.372 m)   Wt 37.6 kg   SpO2 93%   BMI 20.01 kg/m   Physical Exam Vitals and nursing note reviewed.  HENT:     Head: Normocephalic and atraumatic.  Eyes:     Pupils: Pupils are equal, round, and reactive to light.  Cardiovascular:     Rate and Rhythm: Normal rate and regular rhythm.  Pulmonary:     Effort: Pulmonary effort is normal.     Breath sounds: Normal breath sounds.  Abdominal:     Palpations: Abdomen is soft.     Tenderness: There is no abdominal tenderness.  Musculoskeletal:     Comments: 2+ DP pulses bilaterally Sensation tact light touch throughout the 5 out of 5 motor strength bilateral upper and lower extremities Symmetric swelling of right lower extremity  Skin:    General: Skin is warm and dry.  Neurological:     Mental Status: She is alert.  Psychiatric:        Mood and Affect: Mood normal.     Procedures  Procedures  ED Course / MDM   Clinical Course as of 04/16/24 1020  Sun Apr 16, 2024  1018 Ultrasound negative for DVT.  Patient continues to have strong DP pulse.  Appropriate for discharge with PCP follow-up.  No need to continue anticoagulation.  Discussed findings with her and daughter at bedside [MP]    Clinical Course User Index [MP] Pamella Ozell LABOR, DO   Medical Decision Making I, Ozell Pamella DO, have assumed care of this patient from the previous provider pending DVT ultrasound lower extremity reevaluation disposition  Amount and/or Complexity of Data Reviewed Labs: ordered. Radiology: ordered.  Risk Prescription drug management.          Pamella Ozell LABOR, DO 04/16/24 1020

## 2024-04-16 NOTE — Discharge Instructions (Signed)
 You were seen in the emerged part for leg swelling The ultrasound not show any evidence of blood clot It is important that you follow-up with your primary care doctor and continue taking previous prescribed indications including your Lasix  as prescribed Return to the emergency department for severe leg pain if you are unable to walk or have any other concerns

## 2024-04-16 NOTE — ED Notes (Signed)
 Pt verbalized understanding of discharge instructions. Pt dressed for discharge . Pt wheeled from ed family to drive home

## 2024-04-16 NOTE — ED Notes (Signed)
 This RN spoke with staff in [facility]  department to inquire why this patient has not received her DVT US .  US  dept. States they do not do vascular scans at this time.  RN received a phone number to call and a pager number.  No answer on phone number.  Pager called and RN left shift phone number.

## 2024-04-16 NOTE — Progress Notes (Signed)
 VASCULAR LAB    Right lower extremity venous duplex has been performed.  See CV proc for preliminary results.  Relayed negative results to Dr. Pamella via secure chat  RACHEL PELLET, RVT 04/16/2024, 9:33 AM

## 2024-05-02 ENCOUNTER — Other Ambulatory Visit: Payer: Self-pay | Admitting: Physical Medicine and Rehabilitation

## 2024-05-02 DIAGNOSIS — S32040D Wedge compression fracture of fourth lumbar vertebra, subsequent encounter for fracture with routine healing: Secondary | ICD-10-CM

## 2024-05-03 NOTE — Consult Note (Incomplete)
 Chief Complaint: Patient was seen in consultation today for L4 compression fracture   Referring Physician(s): Ramos,Richard  Supervising Physician: Karalee Beat  Patient Status: [facility] - Outpatient   History of Present Illness: Julia Hernandez is an 88 y.o. female with a medical history significant for HTN, COPD, CKD, anxiety/depression, CHF and CAD. She presented to the ED 02/18/24 with lower back pain and imaging showed a history of L1/L2 compression fractures with an acute L4 fracture. She was treated conservatively with pain medications and was discharged with outpatient follow up.   She met with Dr. Bonner 05/01/24 to discuss treatment/management options for her lower back pain. She stated her back pain started approximately 5 years ago which worsened after a heart attack in 2020. Her pain is an 8/10 with activity and she has to sleep in a recliner. She gets some relief from percocet but it is not enough for her to sleep comfortably or to perform her activities of daily living without significant pain/discomfort.   She has been kindly referred to Interventional Radiology to discuss vertebral body augmentation as a possible treatment option.    Past Medical History:  Diagnosis Date   Asthma    COPD (chronic obstructive pulmonary disease) (HCC)    Hypertension     Past Surgical History:  Procedure Laterality Date   BALLOON DILATION N/A 03/24/2022   Procedure: BALLOON DILATION;  Surgeon: Saintclair Jasper, MD;  Location: [facility];  Service: Gastroenterology;  Laterality: N/A;   BALLOON DILATION N/A 09/03/2023   Procedure: BALLOON DILATION;  Surgeon: Saintclair Jasper, MD;  Location: [facility];  Service: Gastroenterology;  Laterality: N/A;   BOTOX  INJECTION  09/03/2023   Procedure: BOTOX  INJECTION;  Surgeon: Saintclair Jasper, MD;  Location: [facility];  Service: Gastroenterology;;   ESOPHAGOGASTRODUODENOSCOPY N/A 03/24/2022   Procedure: ESOPHAGOGASTRODUODENOSCOPY (EGD);   Surgeon: Saintclair Jasper, MD;  Location: [facility];  Service: Gastroenterology;  Laterality: N/A;   ESOPHAGOGASTRODUODENOSCOPY (EGD) WITH PROPOFOL  N/A 09/03/2023   Procedure: ESOPHAGOGASTRODUODENOSCOPY (EGD) WITH PROPOFOL ;  Surgeon: Saintclair Jasper, MD;  Location: [facility];  Service: Gastroenterology;  Laterality: N/A;    Allergies: Influenza vac split quad, Ditropan xl [oxybutynin], and Simvastatin  Medications: Prior to Admission medications   Medication Sig Start Date End Date Taking? Authorizing Provider  acetaminophen  (TYLENOL ) 500 MG tablet Take 500 mg by mouth every 6 (six) hours as needed for moderate pain (pain score 4-6).    [provider]  acyclovir ointment (ZOVIRAX) 5 % Apply 1 application topically 3 (three) times daily as needed (outbreaks).    [provider]  aspirin  EC 81 MG tablet Take 1 tablet (81 mg total) by mouth daily. Swallow whole. 03/31/22   Patel, Pranav M, MD  B Complex-C (B-COMPLEX WITH VITAMIN C) tablet Take 1 tablet by mouth daily. 03/31/22   Tobie Yetta HERO, MD  diazepam  (VALIUM ) 5 MG tablet Take 0.5 tablets (2.5 mg total) by mouth at bedtime. 01/13/24   Perri DELENA Meliton Mickey., MD  ferrous sulfate  325 (65 FE) MG tablet Take 1 tablet (325 mg total) by mouth daily with breakfast. 09/04/23   Samtani, Jai-Gurmukh, MD  furosemide  (LASIX ) 20 MG tablet Take 1 tablet (20 mg total) by mouth daily as needed (weight gain of >3 lbs in 1 day or >5 lbs in 1 week). 03/30/22   Tobie Yetta HERO, MD  Ipratropium-Albuterol  (COMBIVENT  RESPIMAT) 20-100 MCG/ACT AERS respimat Inhale 1 puff into the lungs 4 (four) times daily as needed for shortness of breath. 01/13/24 03/13/24  Perri DELENA Meliton Mickey., MD  losartan  (COZAAR ) 25 MG tablet Take 0.5 tablets (12.5 mg total) by mouth daily. 09/24/23   Colletta Manuelita Garre, PA-C  magic mouthwash w/lidocaine  SOLN Take 5 mLs by mouth 4 (four) times daily as needed for mouth pain.    [provider]  metoprolol  succinate  (TOPROL -XL) 25 MG 24 hr tablet Take 50 mg by mouth daily. 12/28/23   [provider]  pantoprazole  (PROTONIX ) 40 MG tablet Take 1 tablet (40 mg total) by mouth daily. 03/31/22   Patel, Pranav M, MD  rosuvastatin (CRESTOR) 5 MG tablet Take 5 mg by mouth every other day. 12/28/23   [provider]  sertraline  (ZOLOFT ) 100 MG tablet Take 100 mg by mouth at bedtime.    [provider]  spironolactone  (ALDACTONE ) 25 MG tablet Take 1 tablet (25 mg total) by mouth daily. 09/24/23   Colletta Manuelita Garre, PA-C  SYMBICORT  160-4.5 MCG/ACT inhaler Inhale 2 puffs into the lungs 2 (two) times daily. 01/13/24   Perri DELENA Meliton Mickey., MD     No family history on file.  Social History   Socioeconomic History   Marital status: Divorced    Spouse name: Not on file   Number of children: 1   Years of education: Not on file   Highest education level: High school graduate  Occupational History   Occupation: Retired  Tobacco Use   Smoking status: Every Day    Current packs/day: 0.50    Average packs/day: 0.5 packs/day for 60.0 years (30.0 ttl pk-yrs)    Types: Cigarettes   Smokeless tobacco: Never  Vaping Use   Vaping status: Never Used  Substance and Sexual Activity   Alcohol use: No   Drug use: Never   Sexual activity: Not on file  Other Topics Concern   Not on file  Social History Narrative   Not on file   Social Drivers of Health   Financial Resource Strain: Low Risk  (09/03/2023)   Overall Financial Resource Strain (CARDIA)    Difficulty of Paying Living Expenses: Not very hard  Food Insecurity: No Food Insecurity (01/08/2024)   Hunger Vital Sign    Worried About Running Out of Food in the Last Year: Never true    Ran Out of Food in the Last Year: Never true  Transportation Needs: No Transportation Needs (01/08/2024)   PRAPARE - Administrator, Civil Service (Medical): No    Lack of Transportation (Non-Medical): No  Physical Activity: Not on file   Stress: Not on file  Social Connections: Moderately Integrated (01/08/2024)   Social Connection and Isolation Panel    Frequency of Communication with Friends and Family: More than three times a week    Frequency of Social Gatherings with Friends and Family: Twice a week    Attends Religious Services: More than 4 times per year    Active Member of [employer] or Organizations: Yes    Attends Banker Meetings: More than 4 times per year    Marital Status: Widowed    Review of Systems: A 12 point ROS discussed and pertinent positives are indicated in the HPI above.  All other systems are negative.  Review of Systems  Vital Signs: There were no vitals taken for this visit.  Advance Care Plan: The advanced care plan/surrogate decision maker was discussed at the time of visit and documented in the medical record.    Physical Exam  Imaging:     Labs:  CBC: Recent Labs    01/09/24 0546 01/13/24 0508 02/18/24 1306 02/18/24 1515 04/15/24 1735  WBC 5.9 6.9 7.9  --  9.8  HGB 13.8 12.8 15.0 14.6 13.2  HCT 45.7 42.9 46.6* 43.0 42.7  PLT 180 161 204  --  206    COAGS: Recent Labs    04/15/24 1735  INR 1.0    BMP: Recent Labs    01/12/24 0516 01/13/24 0508 02/18/24 1306 02/18/24 1515 04/15/24 1735  NA 142 140 140 142 142  K 4.4 3.7 3.7 4.0 4.1  CL 97* 97* 99 101 100  CO2 36* 37* 29  --  30  GLUCOSE 71 79 92 95 106*  BUN 23 24* 14 13 23   CALCIUM  9.0 8.9 9.6  --  9.3  CREATININE 0.86 0.95 0.71 1.00 1.06*  GFRNONAA >60 59* >60  --  51*    LIVER FUNCTION TESTS: Recent Labs    09/04/23 0554 01/08/24 1735 02/18/24 1306 04/15/24 1735  BILITOT 0.5 0.4 0.9 0.7  AST 16 16 22 22   ALT 11 7 19 16   ALKPHOS 59 84 99 86  PROT 6.8 7.2 8.3* 7.2  ALBUMIN 3.0* 4.1 4.4 3.8    TUMOR MARKERS: No results for input(s): AFPTM, CEA, CA199, CHROMGRNA in the last 8760 hours.  Assessment and Plan:  88 year old female with a history of L4 compression  fracture.   Thank you for this interesting consult.  I greatly enjoyed meeting Amisadai Woodford and look forward to participating in their care.  A copy of this report was sent to the requesting provider on this date.  Electronically Signed: Warren JONELLE Dais, NP 05/03/2024, 3:18 PM   I spent a total of  40 Minutes   in face to face in clinical consultation, greater than 50% of which was counseling/coordinating care for L4 compression fracture.

## 2024-05-05 ENCOUNTER — Telehealth (HOSPITAL_COMMUNITY): Payer: Self-pay | Admitting: Student

## 2024-05-05 ENCOUNTER — Ambulatory Visit
Admission: RE | Admit: 2024-05-05 | Discharge: 2024-05-05 | Disposition: A | Discharge status: Home / Self Care | Source: Ambulatory Visit | Attending: Physical Medicine and Rehabilitation | Admitting: Physical Medicine and Rehabilitation

## 2024-05-05 DIAGNOSIS — S32040D Wedge compression fracture of fourth lumbar vertebra, subsequent encounter for fracture with routine healing: Secondary | ICD-10-CM

## 2024-05-05 HISTORY — PX: IR RADIOLOGIST EVAL & MGMT: IMG5224

## 2024-05-05 MED ORDER — METHOCARBAMOL 500 MG PO TABS: 500.0000 mg | ORAL_TABLET | Freq: Three times a day (TID) | ORAL | 0 refills | Status: DC | PRN

## 2024-05-05 NOTE — Telephone Encounter (Signed)
 Robaxin e-prescribed to [facility] on [address].  Warren Dais, AGACNP-BC 05/05/2024, 9:57 AM

## 2024-05-10 ENCOUNTER — Encounter: Payer: Self-pay | Admitting: Interventional Radiology

## 2024-05-10 DIAGNOSIS — S32040D Wedge compression fracture of fourth lumbar vertebra, subsequent encounter for fracture with routine healing: Secondary | ICD-10-CM

## 2024-05-12 ENCOUNTER — Other Ambulatory Visit: Payer: Self-pay | Admitting: Interventional Radiology

## 2024-05-12 DIAGNOSIS — S32040D Wedge compression fracture of fourth lumbar vertebra, subsequent encounter for fracture with routine healing: Secondary | ICD-10-CM

## 2024-05-23 ENCOUNTER — Telehealth: Payer: Self-pay

## 2024-05-23 DIAGNOSIS — J441 Chronic obstructive pulmonary disease with (acute) exacerbation: Secondary | ICD-10-CM

## 2024-05-29 ENCOUNTER — Emergency Department (HOSPITAL_COMMUNITY)

## 2024-05-29 ENCOUNTER — Other Ambulatory Visit: Payer: Self-pay

## 2024-05-29 ENCOUNTER — Encounter (HOSPITAL_COMMUNITY): Payer: Self-pay

## 2024-05-29 ENCOUNTER — Inpatient Hospital Stay (HOSPITAL_COMMUNITY)
Admission: EM | Admit: 2024-05-29 | Discharge: 2024-06-12 | DRG: 189 | Disposition: E | Attending: Internal Medicine | Admitting: Internal Medicine

## 2024-05-29 DIAGNOSIS — Z66 Do not resuscitate: Secondary | ICD-10-CM | POA: Diagnosis present

## 2024-05-29 DIAGNOSIS — E785 Hyperlipidemia, unspecified: Secondary | ICD-10-CM | POA: Diagnosis present

## 2024-05-29 DIAGNOSIS — N1831 Chronic kidney disease, stage 3a: Secondary | ICD-10-CM | POA: Diagnosis present

## 2024-05-29 DIAGNOSIS — D72829 Elevated white blood cell count, unspecified: Secondary | ICD-10-CM | POA: Diagnosis present

## 2024-05-29 DIAGNOSIS — J9622 Acute and chronic respiratory failure with hypercapnia: Principal | ICD-10-CM | POA: Diagnosis present

## 2024-05-29 DIAGNOSIS — Z7982 Long term (current) use of aspirin: Secondary | ICD-10-CM

## 2024-05-29 DIAGNOSIS — R64 Cachexia: Secondary | ICD-10-CM | POA: Diagnosis present

## 2024-05-29 DIAGNOSIS — G8929 Other chronic pain: Secondary | ICD-10-CM | POA: Diagnosis present

## 2024-05-29 DIAGNOSIS — Z9981 Dependence on supplemental oxygen: Secondary | ICD-10-CM

## 2024-05-29 DIAGNOSIS — R0689 Other abnormalities of breathing: Secondary | ICD-10-CM

## 2024-05-29 DIAGNOSIS — E8729 Other acidosis: Secondary | ICD-10-CM | POA: Diagnosis present

## 2024-05-29 DIAGNOSIS — I5032 Chronic diastolic (congestive) heart failure: Secondary | ICD-10-CM | POA: Diagnosis present

## 2024-05-29 DIAGNOSIS — J4489 Other specified chronic obstructive pulmonary disease: Secondary | ICD-10-CM | POA: Diagnosis present

## 2024-05-29 DIAGNOSIS — J449 Chronic obstructive pulmonary disease, unspecified: Secondary | ICD-10-CM | POA: Diagnosis present

## 2024-05-29 DIAGNOSIS — R079 Chest pain, unspecified: Secondary | ICD-10-CM

## 2024-05-29 DIAGNOSIS — F419 Anxiety disorder, unspecified: Secondary | ICD-10-CM | POA: Diagnosis present

## 2024-05-29 DIAGNOSIS — I130 Hypertensive heart and chronic kidney disease with heart failure and stage 1 through stage 4 chronic kidney disease, or unspecified chronic kidney disease: Secondary | ICD-10-CM | POA: Diagnosis present

## 2024-05-29 DIAGNOSIS — Z7951 Long term (current) use of inhaled steroids: Secondary | ICD-10-CM

## 2024-05-29 DIAGNOSIS — F17210 Nicotine dependence, cigarettes, uncomplicated: Secondary | ICD-10-CM | POA: Diagnosis present

## 2024-05-29 DIAGNOSIS — G4710 Hypersomnia, unspecified: Secondary | ICD-10-CM | POA: Diagnosis present

## 2024-05-29 DIAGNOSIS — R400 Somnolence: Principal | ICD-10-CM

## 2024-05-29 DIAGNOSIS — Z955 Presence of coronary angioplasty implant and graft: Secondary | ICD-10-CM

## 2024-05-29 DIAGNOSIS — E7220 Disorder of urea cycle metabolism, unspecified: Secondary | ICD-10-CM | POA: Diagnosis present

## 2024-05-29 DIAGNOSIS — I10 Essential (primary) hypertension: Secondary | ICD-10-CM | POA: Diagnosis present

## 2024-05-29 DIAGNOSIS — Z515 Encounter for palliative care: Secondary | ICD-10-CM

## 2024-05-29 DIAGNOSIS — I2510 Atherosclerotic heart disease of native coronary artery without angina pectoris: Secondary | ICD-10-CM | POA: Diagnosis present

## 2024-05-29 DIAGNOSIS — G9340 Encephalopathy, unspecified: Secondary | ICD-10-CM | POA: Diagnosis present

## 2024-05-29 DIAGNOSIS — Z1152 Encounter for screening for COVID-19: Secondary | ICD-10-CM

## 2024-05-29 DIAGNOSIS — G9341 Metabolic encephalopathy: Secondary | ICD-10-CM | POA: Diagnosis present

## 2024-05-29 DIAGNOSIS — J9621 Acute and chronic respiratory failure with hypoxia: Secondary | ICD-10-CM | POA: Diagnosis present

## 2024-05-29 DIAGNOSIS — Z79899 Other long term (current) drug therapy: Secondary | ICD-10-CM

## 2024-05-29 DIAGNOSIS — Z887 Allergy status to serum and vaccine status: Secondary | ICD-10-CM

## 2024-05-29 DIAGNOSIS — I959 Hypotension, unspecified: Secondary | ICD-10-CM | POA: Diagnosis present

## 2024-05-29 DIAGNOSIS — Z888 Allergy status to other drugs, medicaments and biological substances status: Secondary | ICD-10-CM

## 2024-05-29 DIAGNOSIS — J9611 Chronic respiratory failure with hypoxia: Secondary | ICD-10-CM | POA: Diagnosis present

## 2024-05-29 DIAGNOSIS — F329 Major depressive disorder, single episode, unspecified: Secondary | ICD-10-CM | POA: Diagnosis present

## 2024-05-29 LAB — CBC WITH DIFFERENTIAL/PLATELET
Abs Immature Granulocytes: 0.03 K/uL (ref 0.00–0.07)
Basophils Absolute: 0 K/uL (ref 0.0–0.1)
Basophils Relative: 0 %
Eosinophils Absolute: 0.1 K/uL (ref 0.0–0.5)
Eosinophils Relative: 1 %
HCT: 46.9 % — ABNORMAL HIGH (ref 36.0–46.0)
Hemoglobin: 14.5 g/dL (ref 12.0–15.0)
Immature Granulocytes: 0 %
Lymphocytes Relative: 10 %
Lymphs Abs: 0.9 K/uL (ref 0.7–4.0)
MCH: 30.9 pg (ref 26.0–34.0)
MCHC: 30.9 g/dL (ref 30.0–36.0)
MCV: 99.8 fL (ref 80.0–100.0)
Monocytes Absolute: 0.5 K/uL (ref 0.1–1.0)
Monocytes Relative: 6 %
Neutro Abs: 7 K/uL (ref 1.7–7.7)
Neutrophils Relative %: 83 %
Platelets: 217 K/uL (ref 150–400)
RBC: 4.7 MIL/uL (ref 3.87–5.11)
RDW: 14.6 % (ref 11.5–15.5)
WBC: 8.6 K/uL (ref 4.0–10.5)
nRBC: 0 % (ref 0.0–0.2)

## 2024-05-29 LAB — BASIC METABOLIC PANEL WITH GFR
Anion gap: 9 (ref 5–15)
BUN: 19 mg/dL (ref 8–23)
CO2: 35 mmol/L — ABNORMAL HIGH (ref 22–32)
Calcium: 9.2 mg/dL (ref 8.9–10.3)
Chloride: 98 mmol/L (ref 98–111)
Creatinine, Ser: 1.04 mg/dL — ABNORMAL HIGH (ref 0.44–1.00)
GFR, Estimated: 52 mL/min — ABNORMAL LOW (ref >60)
Glucose, Bld: 121 mg/dL — ABNORMAL HIGH (ref 70–99)
Potassium: 4.4 mmol/L (ref 3.5–5.1)
Sodium: 142 mmol/L (ref 135–145)

## 2024-05-29 LAB — D-DIMER, QUANTITATIVE: D-Dimer, Quant: 0.48 ug{FEU}/mL (ref 0.00–0.50)

## 2024-05-29 LAB — TROPONIN I (HIGH SENSITIVITY)
Troponin I (High Sensitivity): 24 ng/L — ABNORMAL HIGH (ref <18)
Troponin I (High Sensitivity): 23 ng/L — ABNORMAL HIGH (ref <18)

## 2024-05-29 MED ORDER — ASPIRIN 81 MG PO TBEC: 81.0000 mg | DELAYED_RELEASE_TABLET | Freq: Every day | ORAL | Status: DC

## 2024-05-29 MED ORDER — FLUTICASONE FUROATE-VILANTEROL 200-25 MCG/ACT IN AEPB
1.0000 | INHALATION_SPRAY | Freq: Every day | RESPIRATORY_TRACT | Status: DC
  Filled 2024-05-29: qty 28

## 2024-05-29 MED ORDER — METOPROLOL SUCCINATE ER 50 MG PO TB24: 50.0000 mg | ORAL_TABLET | Freq: Every day | ORAL | Status: DC

## 2024-05-29 MED ORDER — FERROUS SULFATE 325 (65 FE) MG PO TABS
325.0000 mg | ORAL_TABLET | Freq: Every day | ORAL | Status: DC
  Filled 2024-05-29: qty 1

## 2024-05-29 MED ORDER — B COMPLEX-C PO TABS
1.0000 | ORAL_TABLET | Freq: Every day | ORAL | Status: DC
  Filled 2024-05-29 (×3): qty 1

## 2024-05-29 MED ORDER — PANTOPRAZOLE SODIUM 40 MG PO TBEC: 40.0000 mg | DELAYED_RELEASE_TABLET | Freq: Every day | ORAL | Status: DC

## 2024-05-29 MED ORDER — SPIRONOLACTONE 25 MG PO TABS: 25.0000 mg | ORAL_TABLET | Freq: Every day | ORAL | Status: DC

## 2024-05-29 MED ORDER — LOSARTAN POTASSIUM 25 MG PO TABS: 12.5000 mg | ORAL_TABLET | Freq: Every day | ORAL | Status: DC

## 2024-05-29 MED ORDER — IPRATROPIUM-ALBUTEROL 0.5-2.5 (3) MG/3ML IN SOLN: 3.0000 mL | Freq: Four times a day (QID) | RESPIRATORY_TRACT | Status: DC | PRN

## 2024-05-29 NOTE — ED Provider Notes (Signed)
 [place] [facility] Provider Note   CSN: [id number] Arrival date & time: 05/29/24  1841     Patient presents with: Chest Pain   Julia Hernandez is a 88 y.o. female w/ hx of COPD, HTN, CKD, CAD, CHF, here with concern for chest pain and palpitations that occurred earlier today.  Symptoms now resolved.  She denies SOB cough, fevers.    Reports chronic right leg swelling x several weeks. She had DVT ultrasound in July that was negative, 1 month ago.  Hx of COPD and hypoxic respiratory failure.  Normally uses 2L with activity. Smokes 3-4 cigarettes daily.  She reports hx of cardiac stents   HPI     Prior to Admission medications   Medication Sig Start Date End Date Taking? Authorizing Provider  acetaminophen  (TYLENOL ) 500 MG tablet Take 500 mg by mouth every 6 (six) hours as needed for moderate pain (pain score 4-6).    [provider]  acyclovir ointment (ZOVIRAX) 5 % Apply 1 application topically 3 (three) times daily as needed (outbreaks).    [provider]  aspirin  EC 81 MG tablet Take 1 tablet (81 mg total) by mouth daily. Swallow whole. 03/31/22   Patel, Pranav M, MD  B Complex-C (B-COMPLEX WITH VITAMIN C) tablet Take 1 tablet by mouth daily. 03/31/22   Tobie Yetta HERO, MD  diazepam  (VALIUM ) 5 MG tablet Take 0.5 tablets (2.5 mg total) by mouth at bedtime. 01/13/24   Perri DELENA Meliton Mickey., MD  ferrous sulfate  325 (65 FE) MG tablet Take 1 tablet (325 mg total) by mouth daily with breakfast. 09/04/23   Samtani, Jai-Gurmukh, MD  furosemide  (LASIX ) 20 MG tablet Take 1 tablet (20 mg total) by mouth daily as needed (weight gain of >3 lbs in 1 day or >5 lbs in 1 week). 03/30/22   Tobie Yetta HERO, MD  Ipratropium-Albuterol  (COMBIVENT  RESPIMAT) 20-100 MCG/ACT AERS respimat Inhale 1 puff into the lungs 4 (four) times daily as needed for shortness of breath. 01/13/24 03/13/24  Perri DELENA Meliton Mickey., MD  losartan  (COZAAR ) 25 MG tablet Take 0.5  tablets (12.5 mg total) by mouth daily. 09/24/23   Colletta Manuelita Garre, PA-C  magic mouthwash w/lidocaine  SOLN Take 5 mLs by mouth 4 (four) times daily as needed for mouth pain.    [provider]  methocarbamol  (ROBAXIN ) 500 MG tablet Take 1 tablet (500 mg total) by mouth every 8 (eight) hours as needed for up to 60 doses for muscle spasms. 05/05/24   Covington, Jamie R, NP  metoprolol  succinate (TOPROL -XL) 25 MG 24 hr tablet Take 50 mg by mouth daily. 12/28/23   [provider]  pantoprazole  (PROTONIX ) 40 MG tablet Take 1 tablet (40 mg total) by mouth daily. 03/31/22   Tobie Yetta HERO, MD  rosuvastatin  (CRESTOR ) 5 MG tablet Take 5 mg by mouth every other day. 12/28/23   [provider]  sertraline  (ZOLOFT ) 100 MG tablet Take 100 mg by mouth at bedtime.    [provider]  spironolactone  (ALDACTONE ) 25 MG tablet Take 1 tablet (25 mg total) by mouth daily. 09/24/23   Colletta Manuelita Garre, PA-C  SYMBICORT  160-4.5 MCG/ACT inhaler Inhale 2 puffs into the lungs 2 (two) times daily. 01/13/24   Perri DELENA Meliton Mickey., MD    Allergies: Influenza vac split quad, Ditropan xl [oxybutynin], and Simvastatin    Review of Systems  Updated Vital Signs BP (!) 131/59   Pulse 71   Temp 98.6 F (  37 C) (Oral)   Resp 12   Ht 4' 6 (1.372 m)   Wt 39 kg   SpO2 100%   BMI 20.74 kg/m   Physical Exam Constitutional:      General: She is not in acute distress. HENT:     Head: Normocephalic and atraumatic.  Eyes:     Conjunctiva/sclera: Conjunctivae normal.     Pupils: Pupils are equal, round, and reactive to light.  Cardiovascular:     Rate and Rhythm: Normal rate and regular rhythm.  Pulmonary:     Effort: Pulmonary effort is normal. No respiratory distress.     Comments: Faint expiratory wheezing, 92% on room air, speaking in full sentences Abdominal:     General: There is no distension.     Tenderness: There is no abdominal tenderness.  Musculoskeletal:      Comments: Unilateral swelling of right lower extremity without erythema  Skin:    General: Skin is warm and dry.  Neurological:     General: No focal deficit present.     Mental Status: She is alert. Mental status is at baseline.  Psychiatric:        Mood and Affect: Mood normal.        Behavior: Behavior normal.     (all labs ordered are listed, but only abnormal results are displayed) Labs Reviewed  BASIC METABOLIC PANEL WITH GFR - Abnormal; Notable for the following components:      Result Value   CO2 35 (*)    Glucose, Bld 121 (*)    Creatinine, Ser 1.04 (*)    GFR, Estimated 52 (*)    All other components within normal limits  CBC WITH DIFFERENTIAL/PLATELET - Abnormal; Notable for the following components:   HCT 46.9 (*)    All other components within normal limits  TROPONIN I (HIGH SENSITIVITY) - Abnormal; Notable for the following components:   Troponin I (High Sensitivity) 24 (*)    All other components within normal limits  TROPONIN I (HIGH SENSITIVITY) - Abnormal; Notable for the following components:   Troponin I (High Sensitivity) 23 (*)    All other components within normal limits  D-DIMER, QUANTITATIVE    EKG: EKG Interpretation Date/Time:  Monday May 29 2024 20:09:19 EDT Ventricular Rate:  68 PR Interval:  132 QRS Duration:  120 QT Interval:  420 QTC Calculation: 447 R Axis:   204  Text Interpretation: Sinus rhythm IVCD, consider atypical RBBB Lateral infarct, old ST elevation, consider inferior injury Confirmed by Cottie Cough [phone]) on 05/29/2024 8:24:08 PM  Radiology: DG Chest Portable 1 View Result Date: 05/29/2024 CLINICAL DATA:  Palpitations and chest pain EXAM: PORTABLE CHEST 1 VIEW COMPARISON:  04/16/2024 FINDINGS: Stable cardiomediastinal silhouette. Aortic atherosclerotic calcification. Diffuse interstitial coarsening is similar to prior. Reticulonodular opacities in the bilateral upper lobes. The patient's chin obscures the right apex. No  pleural effusion or pneumothorax. No displaced rib fracture. IMPRESSION: Reticulonodular opacities in the bilateral upper lobes may be due small airway infection or inflammation. Electronically Signed   By: Norman Gatlin M.D.   On: 05/29/2024 19:57     Procedures   Medications Ordered in the ED  aspirin  EC tablet 81 mg (has no administration in time range)  B-complex with vitamin C tablet 1 tablet (has no administration in time range)  ferrous sulfate  tablet 325 mg (has no administration in time range)  Ipratropium-Albuterol  (COMBIVENT ) respimat 1 puff (has no administration in time range)  losartan  (COZAAR ) tablet 12.5 mg (has  no administration in time range)  metoprolol  succinate (TOPROL -XL) 24 hr tablet 50 mg (has no administration in time range)  pantoprazole  (PROTONIX ) EC tablet 40 mg (has no administration in time range)  spironolactone  (ALDACTONE ) tablet 25 mg (has no administration in time range)  fluticasone  furoate-vilanterol (BREO ELLIPTA ) 200-25 MCG/ACT 1 puff (has no administration in time range)    Clinical Course as of 05/29/24 2312  Mon May 29, 2024  2056 Daughter at bedside [MT]  2108 Troponin mildly chronically elevated - no significant changes in labs [MT]    Clinical Course User Index [MT] Torrence Branagan, Donnice PARAS, MD                                 Medical Decision Making Amount and/or Complexity of Data Reviewed Labs: ordered. Radiology: ordered. ECG/medicine tests: ordered.  Risk OTC drugs. Prescription drug management.   This patient presents to the ED with concern for palpitations, chest pain. This involves an extensive number of treatment options, and is a complaint that carries with it a high risk of complications and morbidity.  The differential diagnosis includes A Fib vs ACS vs PE vs other  Co-morbidities that complicate the patient evaluation: hx of CHF, CAD  Additional history obtained from EMS  External records from outside source obtained and  reviewed including DVT ultrasound in July - negative  I ordered and personally interpreted labs.  The pertinent results include:  no emergent findings  I ordered imaging studies including dg chest I independently visualized and interpreted imaging which showed no emergent findings - questionable infiltrate vs chronic lung findings (suspected to be chronic) I agree with the radiologist interpretation  The patient was maintained on a cardiac monitor.  I personally viewed and interpreted the cardiac monitored which showed an underlying rhythm of: regular HR  Per my interpretation the patient's ECG shows no acute ischemia  I have reviewed the patients home medicines and have made adjustments as needed  Test Considered: doubt acute PE with negative D-Dimer. Doubt stroke.  Social Determinants of Health:smoking cessation counseled   Disposition:  At the time of attempted discharge, I was unable to rouse the patient. She will open her eyes for a few seconds and gaze blearily at me, but will not speak and immediately falls back asleep.  Her daughter at the bedside reports the patient has had problems with excessive somnolence for a few months, often falling asleep at random throughout the day, but she has never seen the patient so out of it, and has not been able to rouse her for the better part of 2 hours.  This does not appear to be an acute medical process to me.  I reviewed PDMP - patient is prescribed valium  chronically, and also had oxycodone  prescribed in July.  I wonder about polypharmacy.  She also does not wear home oxygen and sleeps with her neck flexed, raising concerns for sleep apnea or hypercapnia.  Her daughter is concerned about taking her home in this state of somnolence. The patient lives alone normally.  We will monitor her in the ED overnight to allow a period to washout any potential sedatives and re-evaluate her mental status in the morning.  If she has improvement, her  daughter can be contacted and she can be discharged home.      Final diagnoses:  Somnolence  Chest pain, unspecified type    ED Discharge Orders  None          Cottie Donnice PARAS, MD 05/29/24 2312

## 2024-05-29 NOTE — ED Triage Notes (Signed)
 Pt BIB [facility] from home with c/o chest pain. Stated it started after lifting a heavy object. On 3 Liters 02. Also c/o swollen right foot. + Pedal pulse.

## 2024-05-30 ENCOUNTER — Encounter (HOSPITAL_COMMUNITY): Payer: Self-pay | Admitting: Internal Medicine

## 2024-05-30 ENCOUNTER — Emergency Department (HOSPITAL_COMMUNITY)

## 2024-05-30 DIAGNOSIS — E785 Hyperlipidemia, unspecified: Secondary | ICD-10-CM

## 2024-05-30 DIAGNOSIS — J449 Chronic obstructive pulmonary disease, unspecified: Secondary | ICD-10-CM

## 2024-05-30 DIAGNOSIS — G9340 Encephalopathy, unspecified: Secondary | ICD-10-CM | POA: Diagnosis not present

## 2024-05-30 DIAGNOSIS — J9601 Acute respiratory failure with hypoxia: Secondary | ICD-10-CM

## 2024-05-30 DIAGNOSIS — R0689 Other abnormalities of breathing: Secondary | ICD-10-CM | POA: Diagnosis not present

## 2024-05-30 DIAGNOSIS — I5030 Unspecified diastolic (congestive) heart failure: Secondary | ICD-10-CM | POA: Diagnosis not present

## 2024-05-30 DIAGNOSIS — I5032 Chronic diastolic (congestive) heart failure: Secondary | ICD-10-CM

## 2024-05-30 DIAGNOSIS — G8929 Other chronic pain: Secondary | ICD-10-CM

## 2024-05-30 DIAGNOSIS — N1831 Chronic kidney disease, stage 3a: Secondary | ICD-10-CM

## 2024-05-30 DIAGNOSIS — F339 Major depressive disorder, recurrent, unspecified: Secondary | ICD-10-CM

## 2024-05-30 DIAGNOSIS — I10 Essential (primary) hypertension: Secondary | ICD-10-CM

## 2024-05-30 DIAGNOSIS — F17210 Nicotine dependence, cigarettes, uncomplicated: Secondary | ICD-10-CM

## 2024-05-30 DIAGNOSIS — Z955 Presence of coronary angioplasty implant and graft: Secondary | ICD-10-CM

## 2024-05-30 DIAGNOSIS — J9611 Chronic respiratory failure with hypoxia: Secondary | ICD-10-CM

## 2024-05-30 LAB — CBC WITH DIFFERENTIAL/PLATELET
Abs Immature Granulocytes: 0.06 K/uL (ref 0.00–0.07)
Basophils Absolute: 0 K/uL (ref 0.0–0.1)
Basophils Relative: 0 %
Eosinophils Absolute: 0 K/uL (ref 0.0–0.5)
Eosinophils Relative: 0 %
HCT: 52.2 % — ABNORMAL HIGH (ref 36.0–46.0)
Hemoglobin: 15 g/dL (ref 12.0–15.0)
Immature Granulocytes: 0 %
Lymphocytes Relative: 4 %
Lymphs Abs: 0.7 K/uL (ref 0.7–4.0)
MCH: 30.1 pg (ref 26.0–34.0)
MCHC: 28.7 g/dL — ABNORMAL LOW (ref 30.0–36.0)
MCV: 104.8 fL — ABNORMAL HIGH (ref 80.0–100.0)
Monocytes Absolute: 0.5 K/uL (ref 0.1–1.0)
Monocytes Relative: 3 %
Neutro Abs: 14 K/uL — ABNORMAL HIGH (ref 1.7–7.7)
Neutrophils Relative %: 93 %
Platelets: 194 K/uL (ref 150–400)
RBC: 4.98 MIL/uL (ref 3.87–5.11)
RDW: 14.3 % (ref 11.5–15.5)
WBC: 15.2 K/uL — ABNORMAL HIGH (ref 4.0–10.5)
nRBC: 0 % (ref 0.0–0.2)

## 2024-05-30 LAB — BLOOD GAS, VENOUS
Acid-Base Excess: 6.7 mmol/L — ABNORMAL HIGH (ref 0.0–2.0)
Acid-Base Excess: 1.4 mmol/L (ref 0.0–2.0)
Acid-Base Excess: 0.3 mmol/L (ref 0.0–2.0)
Bicarbonate: 41 mmol/L — ABNORMAL HIGH (ref 20.0–28.0)
Bicarbonate: 37 mmol/L — ABNORMAL HIGH (ref 20.0–28.0)
Bicarbonate: 34.9 mmol/L — ABNORMAL HIGH (ref 20.0–28.0)
Drawn by: 8168
Drawn by: 8194
Drawn by: 8168
O2 Saturation: 85.2 %
O2 Saturation: 94.7 %
O2 Saturation: 46.1 %
Patient temperature: 37
Patient temperature: 37
Patient temperature: 36.4
pCO2, Ven: >123 mmHg (ref 44–60)
pCO2, Ven: >123 mmHg (ref 44–60)
pCO2, Ven: >123 mmHg (ref 44–60)
pH, Ven: 7.11 — CL (ref 7.25–7.43)
pH, Ven: 7.03 — CL (ref 7.25–7.43)
pH, Ven: 7.05 — CL (ref 7.25–7.43)
pO2, Ven: 51 mmHg — ABNORMAL HIGH (ref 32–45)
pO2, Ven: 65 mmHg — ABNORMAL HIGH (ref 32–45)
pO2, Ven: <31 mmHg — CL (ref 32–45)

## 2024-05-30 LAB — URINALYSIS, ROUTINE W REFLEX MICROSCOPIC
Bilirubin Urine: NEGATIVE
Glucose, UA: NEGATIVE mg/dL
Hgb urine dipstick: NEGATIVE
Ketones, ur: NEGATIVE mg/dL
Leukocytes,Ua: NEGATIVE
Nitrite: NEGATIVE
Protein, ur: 30 mg/dL — AB
Specific Gravity, Urine: 1.028 (ref 1.005–1.030)
pH: 5 (ref 5.0–8.0)

## 2024-05-30 LAB — ETHANOL: Alcohol, Ethyl (B): <15 mg/dL (ref <15)

## 2024-05-30 LAB — LACTIC ACID, PLASMA: Lactic Acid, Venous: 0.8 mmol/L (ref 0.5–1.9)

## 2024-05-30 LAB — CREATININE, SERUM
Creatinine, Ser: 0.99 mg/dL (ref 0.44–1.00)
GFR, Estimated: 56 mL/min — ABNORMAL LOW (ref >60)

## 2024-05-30 LAB — I-STAT VENOUS BLOOD GAS, ED
Acid-Base Excess: 7 mmol/L — ABNORMAL HIGH (ref 0.0–2.0)
Bicarbonate: 35.7 mmol/L — ABNORMAL HIGH (ref 20.0–28.0)
Calcium, Ion: 0.97 mmol/L — ABNORMAL LOW (ref 1.15–1.40)
HCT: 48 % — ABNORMAL HIGH (ref 36.0–46.0)
Hemoglobin: 16.3 g/dL — ABNORMAL HIGH (ref 12.0–15.0)
O2 Saturation: 98 %
Potassium: 4.9 mmol/L (ref 3.5–5.1)
Sodium: 137 mmol/L (ref 135–145)
TCO2: 38 mmol/L — ABNORMAL HIGH (ref 22–32)
pCO2, Ven: 64.8 mmHg — ABNORMAL HIGH (ref 44–60)
pH, Ven: 7.35 (ref 7.25–7.43)
pO2, Ven: 106 mmHg — ABNORMAL HIGH (ref 32–45)

## 2024-05-30 LAB — BASIC METABOLIC PANEL WITH GFR
Anion gap: 10 (ref 5–15)
BUN: 29 mg/dL — ABNORMAL HIGH (ref 8–23)
CO2: 32 mmol/L (ref 22–32)
Calcium: 8.2 mg/dL — ABNORMAL LOW (ref 8.9–10.3)
Chloride: 96 mmol/L — ABNORMAL LOW (ref 98–111)
Creatinine, Ser: 1.02 mg/dL — ABNORMAL HIGH (ref 0.44–1.00)
GFR, Estimated: 54 mL/min — ABNORMAL LOW (ref >60)
Glucose, Bld: 104 mg/dL — ABNORMAL HIGH (ref 70–99)
Potassium: 5.2 mmol/L — ABNORMAL HIGH (ref 3.5–5.1)
Sodium: 138 mmol/L (ref 135–145)

## 2024-05-30 LAB — RESP PANEL BY RT-PCR (RSV, FLU A&B, COVID)  RVPGX2
Influenza A by PCR: NEGATIVE
Influenza B by PCR: NEGATIVE
Resp Syncytial Virus by PCR: NEGATIVE
SARS Coronavirus 2 by RT PCR: NEGATIVE

## 2024-05-30 LAB — COMPREHENSIVE METABOLIC PANEL WITH GFR
ALT: 12 U/L (ref 0–44)
AST: 24 U/L (ref 15–41)
Albumin: 3.5 g/dL (ref 3.5–5.0)
Alkaline Phosphatase: 72 U/L (ref 38–126)
Anion gap: 9 (ref 5–15)
BUN: 20 mg/dL (ref 8–23)
CO2: 32 mmol/L (ref 22–32)
Calcium: 9.1 mg/dL (ref 8.9–10.3)
Chloride: 98 mmol/L (ref 98–111)
Creatinine, Ser: 0.97 mg/dL (ref 0.44–1.00)
GFR, Estimated: 57 mL/min — ABNORMAL LOW (ref >60)
Glucose, Bld: 89 mg/dL (ref 70–99)
Potassium: 5 mmol/L (ref 3.5–5.1)
Sodium: 139 mmol/L (ref 135–145)
Total Bilirubin: 0.4 mg/dL (ref 0.0–1.2)
Total Protein: 6.9 g/dL (ref 6.5–8.1)

## 2024-05-30 LAB — RAPID URINE DRUG SCREEN, HOSP PERFORMED
Amphetamines: NOT DETECTED
Barbiturates: NOT DETECTED
Benzodiazepines: POSITIVE — AB
Cocaine: NOT DETECTED
Opiates: NOT DETECTED
Tetrahydrocannabinol: NOT DETECTED

## 2024-05-30 LAB — PROCALCITONIN: Procalcitonin: <0.1 ng/mL

## 2024-05-30 LAB — AMMONIA
Ammonia: 56 umol/L — ABNORMAL HIGH (ref 9–35)
Ammonia: 57 umol/L — ABNORMAL HIGH (ref 9–35)

## 2024-05-30 LAB — TSH: TSH: 0.6 u[IU]/mL (ref 0.350–4.500)

## 2024-05-30 LAB — MAGNESIUM: Magnesium: 1.9 mg/dL (ref 1.7–2.4)

## 2024-05-30 LAB — CBG MONITORING, ED: Glucose-Capillary: 78 mg/dL (ref 70–99)

## 2024-05-30 LAB — I-STAT CG4 LACTIC ACID, ED: Lactic Acid, Venous: 1.4 mmol/L (ref 0.5–1.9)

## 2024-05-30 LAB — PHOSPHORUS: Phosphorus: 6.8 mg/dL — ABNORMAL HIGH (ref 2.5–4.6)

## 2024-05-30 MED ORDER — SODIUM CHLORIDE 0.9 % IV BOLUS
500.0000 mL | Freq: Once | INTRAVENOUS | Status: AC
  Administered 2024-05-30: 500 mL via INTRAVENOUS

## 2024-05-30 MED ORDER — SODIUM CHLORIDE 0.9% FLUSH
3.0000 mL | Freq: Two times a day (BID) | INTRAVENOUS | Status: DC
  Administered 2024-05-30 – 2024-06-02 (×7): 3 mL via INTRAVENOUS

## 2024-05-30 MED ORDER — IPRATROPIUM-ALBUTEROL 0.5-2.5 (3) MG/3ML IN SOLN
3.0000 mL | Freq: Four times a day (QID) | RESPIRATORY_TRACT | Status: DC
  Administered 2024-05-30 – 2024-05-31 (×3): 3 mL via RESPIRATORY_TRACT
  Filled 2024-05-30 (×3): qty 3

## 2024-05-30 MED ORDER — ROSUVASTATIN CALCIUM 5 MG PO TABS
5.0000 mg | ORAL_TABLET | ORAL | Status: DC
Start: 2024-05-31 — End: 2024-05-31

## 2024-05-30 MED ORDER — SODIUM CHLORIDE 0.9 % IV SOLN
1.0000 g | INTRAVENOUS | Status: DC
  Administered 2024-05-30: 1 g via INTRAVENOUS
  Filled 2024-05-30: qty 10

## 2024-05-30 MED ORDER — ACETAMINOPHEN 650 MG RE SUPP: 650.0000 mg | Freq: Four times a day (QID) | RECTAL | Status: DC | PRN

## 2024-05-30 MED ORDER — IOHEXOL 350 MG/ML SOLN
75.0000 mL | Freq: Once | INTRAVENOUS | Status: AC | PRN
  Administered 2024-05-30: 75 mL via INTRAVENOUS

## 2024-05-30 MED ORDER — IOHEXOL 350 MG/ML SOLN
65.0000 mL | Freq: Once | INTRAVENOUS | Status: AC | PRN
  Administered 2024-05-30: 65 mL via INTRAVENOUS

## 2024-05-30 MED ORDER — HEPARIN SODIUM (PORCINE) 5000 UNIT/ML IJ SOLN
5000.0000 [IU] | Freq: Three times a day (TID) | INTRAMUSCULAR | Status: DC
  Administered 2024-05-30 – 2024-05-31 (×2): 5000 [IU] via SUBCUTANEOUS
  Filled 2024-05-30 (×2): qty 1

## 2024-05-30 MED ORDER — ONDANSETRON HCL 4 MG/2ML IJ SOLN
4.0000 mg | Freq: Once | INTRAMUSCULAR | Status: AC
  Administered 2024-05-30: 4 mg via INTRAVENOUS
  Filled 2024-05-30: qty 2

## 2024-05-30 MED ORDER — ALBUMIN HUMAN 5 % IV SOLN
25.0000 g | Freq: Once | INTRAVENOUS | Status: AC
  Administered 2024-05-30: 25 g via INTRAVENOUS
  Filled 2024-05-30: qty 500

## 2024-05-30 MED ORDER — ENSURE PLUS HIGH PROTEIN PO LIQD: 237.0000 mL | Freq: Two times a day (BID) | ORAL | Status: DC

## 2024-05-30 MED ORDER — POLYETHYLENE GLYCOL 3350 17 G PO PACK: 17.0000 g | PACK | Freq: Every day | ORAL | Status: DC | PRN

## 2024-05-30 MED ORDER — ACETAMINOPHEN 325 MG PO TABS: 650.0000 mg | ORAL_TABLET | Freq: Four times a day (QID) | ORAL | Status: DC | PRN

## 2024-05-30 MED ORDER — SODIUM CHLORIDE 0.9 % IV BOLUS
1000.0000 mL | Freq: Once | INTRAVENOUS | Status: AC
  Administered 2024-05-30: 1000 mL via INTRAVENOUS

## 2024-05-30 MED ORDER — SODIUM CHLORIDE 0.9 % IV SOLN
500.0000 mg | INTRAVENOUS | Status: DC
  Administered 2024-05-30: 500 mg via INTRAVENOUS
  Filled 2024-05-30: qty 5

## 2024-05-30 NOTE — ED Provider Notes (Signed)
 I assumed care of the patient at 0 800, briefly the patient is an 88 year old female here with chest pain and palpitations.  She was evaluated for this but unfortunately had a change in her mental status while in the emergency department.  Thought to be secondary to patient's chronic medications.  Plan to observe overnight and reassess.  I assessed the patient this morning and she continues to be quite somnolent.  She does open her eyes to voice and moves all 4 extremities spontaneously but quickly falls back asleep.    She seems a bit uncomfortable when I palpate on her abdomen but does not speak for me.  I will repeat blood work this morning.  CT of the head and CT abdomen pelvis reassess.  CT of the head without obvious acute intracranial finding.  CT abdomen pelvis without obvious acute intra-abdominal pathology though limited due to motion.  UA without obvious infection.  Repeat labs now with no leukocytosis no hypercarbia lactate was normal.  Ammonia minimally elevated.  Patient continues to be sleepy.  Will discuss with medicine for admission.     Emil Share, DO 05/30/24 1251

## 2024-05-30 NOTE — ED Notes (Signed)
 EDP Emil advised of patient AMS.

## 2024-05-30 NOTE — ED Notes (Addendum)
 Per daughter, pt sat up and said I'm sick and started to vomit. This paramedic cleaned patient and changed clothing. Pt kept repeating what do you want me to do but would not respond to any other questions. EDP advised

## 2024-05-30 NOTE — Consult Note (Incomplete)
 NAME:  Julia Hernandez, MRN:  [id number], DOB:  Dec 20, 1935, LOS: 0 ADMISSION DATE:  05/29/2024 CONSULTATION DATE:  05/30/2024 REFERRING MD:  [place] - TRH, CHIEF COMPLAINT:  Acute hypercarbic respiratory failure  History of Present Illness:  88 year old woman who presented to [facility] 8/18 for CP. PMHx significant for HTN, HLD, CAD, HFpEF (Echo 01/2024 EF 55-60%, G1DD), asthma/COPD (on 2L HOT with activity), tobacco use, CKD stage 3a.  Patient presented to [facility] ED with CP and palpitations (transient). Also notes RLE swelling for the last several weeks. US  RLE Doppler were completed 7/6 and negative for DVT. Initial ED workup was reassuring with plan for discharge home; however when provider went to wake patient and she was profoundly somnolent, opening eyes for a few seconds then falling back to sleep. Family endorses somnolence as a common issue at home; does take Valium  and oxycodone  as home medications. Concern for polypharmacy, patient continued to be monitored in the ED but unfortunately had persistent AMS requiring admission.  In ED, labs were notable for initial WBC 8.6 (repeat 15.2), Hgb 14.5, Plt 217. Na 142, K 4.4, CO2 35, BUN/Cr 19/1.04. Ammonia 56. D-dimer WNL. Trop 24 > 23. TSH 0.6. VBG 7.35/pCO2 64.8/bicarb 35.7. LA 1.4. UDS +BZDs (home Valium ). Ethanol negative. UA with small protein, rare bacteria. PCT < 0.10. CT Head NAICA, CT A/P without obvious intraabdominal pathology. Repeat VBG 8/19 showed pH 7.11/pCO2 >123/bicarb 41, K 5.2.   Patient was placed on BiPAP. PCCM consulted for management and recommendations. Of note, patient is DNR/DNI.  Pertinent Medical History:   Past Medical History:  Diagnosis Date   AKI (acute kidney injury) (HCC) 03/20/2022   Asthma    COPD (chronic obstructive pulmonary disease) (HCC)    Hypertension    Significant Hospital Events: Including procedures, antibiotic start and stop dates in addition to other pertinent events   8/18 - Presented to [facility] for  CP.  Interim History / Subjective:  PCCM consulted for management of hypercarbic respiratory failure.  Objective:  Blood pressure (!) 97/40, pulse 84, temperature 97.8 F (36.6 C), temperature source Oral, resp. rate 20, height 4' 6 (1.372 m), weight 39 kg, SpO2 93%.    FiO2 (%):  [30 %-40 %] 40 % PEEP:  [4 cmH20] 4 cmH20  No intake or output data in the 24 hours ending 05/30/24 2148 Filed Weights   05/29/24 1856  Weight: 39 kg   Physical Examination: General: {SRACUITY:25313} ill-appearing *** in NAD. HEENT: [place]/AT, anicteric sclera, PERRL, moist mucous membranes. Neuro: {SRLOC:25308} {SRSTIMULI:25309} {SRCOMMANDS:25310} {SRNEUROEXTREMITIES:25312} Strength ***/5 in *** extremities. {SRRBRAINSTEM:25311}  CV: RRR, no m/g/r. PULM: Breathing even and unlabored on ***. Lung fields ***. GI: Soft, nontender, nondistended. Normoactive bowel sounds. Extremities: *** LE edema noted. Skin: Warm/dry, ***.  Resolved Hospital Problem List:    Assessment & Plan:  ***  Best Practice: (right click and Reselect all SmartList Selections daily)   Diet/type: {diet type:25684} DVT prophylaxis: {anticoagulation (Optional):25687} GI prophylaxis: {HP:73065} Lines: {Central Venous Access:25771} Foley:  {Central Venous Access:25691} Code Status:  {Code Status:26939} Last date of multidisciplinary goals of care discussion [***]  Labs:  CBC: Recent Labs  Lab 05/29/24 2007 05/30/24 1018 05/30/24 1058  WBC 8.6 15.2*  --   NEUTROABS 7.0 14.0*  --   HGB 14.5 15.0 16.3*  HCT 46.9* 52.2* 48.0*  MCV 99.8 104.8*  --   PLT 217 194  --    Basic Metabolic Panel: Recent Labs  Lab 05/29/24 2007 05/30/24 1018 05/30/24 1058 05/30/24 1956  NA  142 139 137  --   K 4.4 5.0 4.9  --   CL 98 98  --   --   CO2 35* 32  --   --   GLUCOSE 121* 89  --   --   BUN 19 20  --   --   CREATININE 1.04* 0.97  --  0.99  CALCIUM  9.2 9.1  --   --    GFR: Estimated Creatinine Clearance: 22.3 mL/min (by C-G  formula based on SCr of 0.99 mg/dL). Recent Labs  Lab 05/29/24 2007 05/30/24 1018 05/30/24 1059 05/30/24 1956  PROCALCITON  --   --   --  <0.10  WBC 8.6 15.2*  --   --   LATICACIDVEN  --   --  1.4  --    Liver Function Tests: Recent Labs  Lab 05/30/24 1018  AST 24  ALT 12  ALKPHOS 72  BILITOT 0.4  PROT 6.9  ALBUMIN  3.5   No results for input(s): LIPASE, AMYLASE in the last 168 hours. Recent Labs  Lab 05/30/24 1018  AMMONIA 56*   ABG:    Component Value Date/Time   HCO3 37.0 (H) 05/30/2024 2114   TCO2 38 (H) 05/30/2024 1058   O2SAT 94.7 05/30/2024 2114    Coagulation Profile: No results for input(s): INR, PROTIME in the last 168 hours.  Cardiac Enzymes: No results for input(s): CKTOTAL, CKMB, CKMBINDEX, TROPONINI in the last 168 hours.  HbA1C: No results found for: HGBA1C  CBG: Recent Labs  Lab 05/30/24 1045  GLUCAP 78   Review of Systems:   ***  Past Medical History:  She,  has a past medical history of AKI (acute kidney injury) (HCC) (03/20/2022), Asthma, COPD (chronic obstructive pulmonary disease) (HCC), and Hypertension.   Surgical History:   Past Surgical History:  Procedure Laterality Date   BALLOON DILATION N/A 03/24/2022   Procedure: BALLOON DILATION;  Surgeon: Saintclair Jasper, MD;  Location: [facility];  Service: Gastroenterology;  Laterality: N/A;   BALLOON DILATION N/A 09/03/2023   Procedure: BALLOON DILATION;  Surgeon: Saintclair Jasper, MD;  Location: [facility];  Service: Gastroenterology;  Laterality: N/A;   BOTOX  INJECTION  09/03/2023   Procedure: BOTOX  INJECTION;  Surgeon: Saintclair Jasper, MD;  Location: [facility];  Service: Gastroenterology;;   ESOPHAGOGASTRODUODENOSCOPY N/A 03/24/2022   Procedure: ESOPHAGOGASTRODUODENOSCOPY (EGD);  Surgeon: Saintclair Jasper, MD;  Location: [facility];  Service: Gastroenterology;  Laterality: N/A;   ESOPHAGOGASTRODUODENOSCOPY (EGD) WITH PROPOFOL  N/A 09/03/2023   Procedure:  ESOPHAGOGASTRODUODENOSCOPY (EGD) WITH PROPOFOL ;  Surgeon: Saintclair Jasper, MD;  Location: [facility];  Service: Gastroenterology;  Laterality: N/A;   IR RADIOLOGIST EVAL & MGMT  05/05/2024   Social History:   reports that she has been smoking cigarettes. She has a 30 pack-year smoking history. She has never used smokeless tobacco. She reports that she does not drink alcohol and does not use drugs.   Family History:  Her family history is not on file.   Allergies: Allergies  Allergen Reactions   Influenza Vac Split Quad Shortness Of Breath   Ditropan Xl [Oxybutynin] Other (See Comments)    Unknown reaction   Simvastatin Rash   Home Medications: Prior to Admission medications   Medication Sig Start Date End Date Taking? Authorizing Provider  acetaminophen  (TYLENOL ) 500 MG tablet Take 500 mg by mouth every 6 (six) hours as needed for moderate pain (pain score 4-6).    [provider]  acyclovir ointment (ZOVIRAX) 5 % Apply 1 application topically 3 (three) times  daily as needed (outbreaks).    [provider]  aspirin  EC 81 MG tablet Take 1 tablet (81 mg total) by mouth daily. Swallow whole. 03/31/22   Patel, Pranav M, MD  B Complex-C (B-COMPLEX WITH VITAMIN C) tablet Take 1 tablet by mouth daily. 03/31/22   Tobie Yetta HERO, MD  Cyanocobalamin  (VITAMIN B-12) 3000 MCG SUBL Place 1 tablet under the tongue daily. 05/25/24   [provider]  diazepam  (VALIUM ) 5 MG tablet Take 0.5 tablets (2.5 mg total) by mouth at bedtime. 01/13/24   Perri DELENA Meliton Mickey., MD  ferrous sulfate  325 (65 FE) MG tablet Take 1 tablet (325 mg total) by mouth daily with breakfast. 09/04/23   Samtani, Jai-Gurmukh, MD  furosemide  (LASIX ) 20 MG tablet Take 1 tablet (20 mg total) by mouth daily as needed (weight gain of >3 lbs in 1 day or >5 lbs in 1 week). 03/30/22   Tobie Yetta HERO, MD  Ipratropium-Albuterol  (COMBIVENT  RESPIMAT) 20-100 MCG/ACT AERS respimat Inhale 1 puff into the lungs 4 (four) times  daily as needed for shortness of breath. 01/13/24 03/13/24  Perri DELENA Meliton Mickey., MD  losartan  (COZAAR ) 25 MG tablet Take 0.5 tablets (12.5 mg total) by mouth daily. 09/24/23   Colletta Manuelita Garre, PA-C  magic mouthwash w/lidocaine  SOLN Take 5 mLs by mouth 4 (four) times daily as needed for mouth pain.    [provider]  methocarbamol  (ROBAXIN ) 500 MG tablet Take 1 tablet (500 mg total) by mouth every 8 (eight) hours as needed for up to 60 doses for muscle spasms. 05/05/24   Covington, Jamie R, NP  metoprolol  succinate (TOPROL -XL) 25 MG 24 hr tablet Take 50 mg by mouth daily. 12/28/23   [provider]  nystatin (MYCOSTATIN) 100000 UNIT/ML suspension Use as directed 4 mLs in the mouth or throat 4 (four) times daily. 05/29/24   [provider]  oxyCODONE  (OXY IR/ROXICODONE ) 5 MG immediate release tablet Take 5 mg by mouth 4 (four) times daily. 03/17/24   [provider]  pantoprazole  (PROTONIX ) 40 MG tablet Take 1 tablet (40 mg total) by mouth daily. 03/31/22   Tobie Yetta HERO, MD  rosuvastatin  (CRESTOR ) 5 MG tablet Take 5 mg by mouth every other day. 12/28/23   [provider]  sertraline  (ZOLOFT ) 100 MG tablet Take 100 mg by mouth at bedtime.    [provider]  spironolactone  (ALDACTONE ) 25 MG tablet Take 1 tablet (25 mg total) by mouth daily. 09/24/23   Colletta Manuelita Garre, PA-C  SYMBICORT  160-4.5 MCG/ACT inhaler Inhale 2 puffs into the lungs 2 (two) times daily. 01/13/24   Perri DELENA Meliton Mickey., MD   Critical care time:   The patient is critically ill with multiple organ system failure and requires high complexity decision making for assessment and support, frequent evaluation and titration of therapies, advanced monitoring, review of radiographic studies and interpretation of complex data.   Critical Care Time devoted to patient care services, exclusive of separately billable procedures, described in this note is *** minutes.  Corean HERO Taniaya Rudder, PA-C [facility] 05/30/24 9:48 PM  Please see [email or web address] for pager details.  From 7A-7P if no response, please call [phone] After hours, please call ELink [phone]

## 2024-05-30 NOTE — ED Notes (Signed)
 Pt is alert but confused. Able to follow commands in squeezing my hands or lifting her legs but was unable to follow commands to drink.

## 2024-05-30 NOTE — Progress Notes (Signed)
 RT NOTE:  Pt transported w/ BIPAP to 3E14 without event. RT gave report to [facility], RRT.

## 2024-05-30 NOTE — H&P (Addendum)
 History and Physical   Julia Hernandez FMW:[id number] DOB: 1936-09-14 DOA: 05/29/2024  PCP: Cleotilde Planas, MD   Patient coming from: Home  Chief Complaint: Chest pain  HPI: Julia Hernandez is a 88 y.o. female with medical history significant of hypertension, hyperlipidemia, CKD 3A, CAD status post stent, chronic diastolic CHF, anxiety, depression, COPD, chronic respiratory failure with hypoxia on 2 L, chronic pain presenting with chest pain.  Patient presented with chest pain initially but developed persistent somnolence/altered mental status.  Patient presented yesterday with chest pain via EMS.  She had had several weeks of right lower extremity swelling with a negative DVT study in July.  She is chronically on 2 L submental oxygen.  Chest pain started after lifting a heavy object and had resolved by the time she presented to the ED.  Found to be hypersomnolent in the ED and has had ongoing issues with this for few months per family.  Will fall asleep at random times.  He is prescribed oxycodone  and Valium .  Patient did take something after having a heated conversation at home in the afternoon prior to transport to the ED yesterday per family.  Suspicious that this is medication induced.  But patient has failed to improve while monitored in the ED overnight.  Patient unable to fully participate in review of systems with somnolence and AMS.  ED Course: Vital signs in the ED notable for blood pressure in the 90s-170 systolic, respiratory rate in the teens-20s.  On her home 2 to 3 L of meloxicam.  Lab workup included CMP within normal limits.  CBC today with leukocytosis to 15.2 which is increased from normal yesterday.  Lactic acid normal.  Troponin flat at 24, 23.  D-dimer normal.  Ammonia level mildly elevated at 56.  UDS positive for benzos.  Ethanol level pending.  Urinalysis with protein and rare bacteria only.  VBG with normal pH and elevated pCO2 of 64.5.  Chest x-ray yesterday showed  reticulonodular opacities consistent with small airway infection versus inflammation.  CT head today showed no acute abnormality.  CT abdomen pelvis today showed motion degraded exam with increasing compression deformity at L3 and L5 with diverticulosis but no diverticulitis as well as evidence of changes consistent with gastritis.  Patient received 1 L IV fluids in started on home aspirin , losartan , spironolactone , metoprolol , PPI, Breo, DuoNeb in the ED.  Patient remains somnolent and is being admitted for further observation/workup.  Review of Systems: Patient unable to fully participate in review of systems with somnolence and AMS.  Past Medical History:  Diagnosis Date   AKI (acute kidney injury) (HCC) 03/20/2022   Asthma    COPD (chronic obstructive pulmonary disease) (HCC)    Hypertension     Past Surgical History:  Procedure Laterality Date   BALLOON DILATION N/A 03/24/2022   Procedure: BALLOON DILATION;  Surgeon: Saintclair Jasper, MD;  Location: [facility];  Service: Gastroenterology;  Laterality: N/A;   BALLOON DILATION N/A 09/03/2023   Procedure: BALLOON DILATION;  Surgeon: Saintclair Jasper, MD;  Location: [facility];  Service: Gastroenterology;  Laterality: N/A;   BOTOX  INJECTION  09/03/2023   Procedure: BOTOX  INJECTION;  Surgeon: Saintclair Jasper, MD;  Location: [facility];  Service: Gastroenterology;;   ESOPHAGOGASTRODUODENOSCOPY N/A 03/24/2022   Procedure: ESOPHAGOGASTRODUODENOSCOPY (EGD);  Surgeon: Saintclair Jasper, MD;  Location: [facility];  Service: Gastroenterology;  Laterality: N/A;   ESOPHAGOGASTRODUODENOSCOPY (EGD) WITH PROPOFOL  N/A 09/03/2023   Procedure: ESOPHAGOGASTRODUODENOSCOPY (EGD) WITH PROPOFOL ;  Surgeon: Saintclair Jasper, MD;  Location: [facility];  Service: Gastroenterology;  Laterality: N/A;   IR RADIOLOGIST EVAL & MGMT  05/05/2024    Social History  reports that she has been smoking cigarettes. She has a 30 pack-year smoking history. She has never used smokeless  tobacco. She reports that she does not drink alcohol and does not use drugs.  Allergies  Allergen Reactions   Influenza Vac Split Quad Shortness Of Breath   Ditropan Xl [Oxybutynin] Other (See Comments)    Unknown reaction   Simvastatin Rash   History reviewed. No pertinent family history.   Prior to Admission medications   Medication Sig Start Date End Date Taking? Authorizing Provider  acetaminophen  (TYLENOL ) 500 MG tablet Take 500 mg by mouth every 6 (six) hours as needed for moderate pain (pain score 4-6).    [provider]  acyclovir ointment (ZOVIRAX) 5 % Apply 1 application topically 3 (three) times daily as needed (outbreaks).    [provider]  aspirin  EC 81 MG tablet Take 1 tablet (81 mg total) by mouth daily. Swallow whole. 03/31/22   Patel, Pranav M, MD  B Complex-C (B-COMPLEX WITH VITAMIN C) tablet Take 1 tablet by mouth daily. 03/31/22   Patel, Pranav M, MD  Cyanocobalamin  (VITAMIN B-12) 3000 MCG SUBL Place 1 tablet under the tongue daily. 05/25/24   [provider]  diazepam  (VALIUM ) 5 MG tablet Take 0.5 tablets (2.5 mg total) by mouth at bedtime. 01/13/24   Perri DELENA Meliton Mickey., MD  ferrous sulfate  325 (65 FE) MG tablet Take 1 tablet (325 mg total) by mouth daily with breakfast. 09/04/23   Samtani, Jai-Gurmukh, MD  furosemide  (LASIX ) 20 MG tablet Take 1 tablet (20 mg total) by mouth daily as needed (weight gain of >3 lbs in 1 day or >5 lbs in 1 week). 03/30/22   Tobie Yetta HERO, MD  Ipratropium-Albuterol  (COMBIVENT  RESPIMAT) 20-100 MCG/ACT AERS respimat Inhale 1 puff into the lungs 4 (four) times daily as needed for shortness of breath. 01/13/24 03/13/24  Perri DELENA Meliton Mickey., MD  losartan  (COZAAR ) 25 MG tablet Take 0.5 tablets (12.5 mg total) by mouth daily. 09/24/23   Colletta Manuelita Garre, PA-C  magic mouthwash w/lidocaine  SOLN Take 5 mLs by mouth 4 (four) times daily as needed for mouth pain.    [provider]  methocarbamol  (ROBAXIN ) 500  MG tablet Take 1 tablet (500 mg total) by mouth every 8 (eight) hours as needed for up to 60 doses for muscle spasms. 05/05/24   Covington, Jamie R, NP  metoprolol  succinate (TOPROL -XL) 25 MG 24 hr tablet Take 50 mg by mouth daily. 12/28/23   [provider]  nystatin (MYCOSTATIN) 100000 UNIT/ML suspension Use as directed 4 mLs in the mouth or throat 4 (four) times daily. 05/29/24   [provider]  oxyCODONE  (OXY IR/ROXICODONE ) 5 MG immediate release tablet Take 5 mg by mouth 4 (four) times daily. 03/17/24   [provider]  pantoprazole  (PROTONIX ) 40 MG tablet Take 1 tablet (40 mg total) by mouth daily. 03/31/22   Tobie Yetta HERO, MD  rosuvastatin  (CRESTOR ) 5 MG tablet Take 5 mg by mouth every other day. 12/28/23   [provider]  sertraline  (ZOLOFT ) 100 MG tablet Take 100 mg by mouth at bedtime.    [provider]  spironolactone  (ALDACTONE ) 25 MG tablet Take 1 tablet (25 mg total) by mouth daily. 09/24/23   Colletta Manuelita Garre, PA-C  SYMBICORT  160-4.5 MCG/ACT inhaler Inhale 2 puffs into the lungs 2 (two) times daily. 01/13/24  Perri DELENA Meliton Mickey., MD    Physical Exam: Vitals:   05/30/24 0845 05/30/24 1130 05/30/24 1133 05/30/24 1230  BP: (!) 110/43 (!) 143/55  (!) 140/60  Pulse: 83 88  81  Resp: 13 (!) 25  (!) 21  Temp:   98.3 F (36.8 C)   TempSrc:   Oral   SpO2: 100% 98%  100%  Weight:      Height:        Physical Exam Constitutional:      General: She is not in acute distress.    Appearance: She is ill-appearing.     Comments: Somnolent, Thin elderly female  HENT:     Head: Normocephalic and atraumatic.     Mouth/Throat:     Mouth: Mucous membranes are moist.     Pharynx: Oropharynx is clear.  Eyes:     Extraocular Movements: Extraocular movements intact.     Pupils: Pupils are equal, round, and reactive to light.  Cardiovascular:     Rate and Rhythm: Normal rate and regular rhythm.     Pulses: Normal pulses.     Heart sounds:  Normal heart sounds.  Pulmonary:     Effort: Pulmonary effort is normal. No respiratory distress.     Breath sounds: Rhonchi present.  Abdominal:     General: Bowel sounds are normal. There is no distension.     Palpations: Abdomen is soft.     Tenderness: There is no abdominal tenderness.  Musculoskeletal:        General: No swelling or deformity.  Skin:    General: Skin is warm and dry.  Neurological:     General: No focal deficit present.     Mental Status: She is disoriented.     Comments: Somnolent    Labs on Admission: I have personally reviewed following labs and imaging studies  CBC: Recent Labs  Lab 05/29/24 2007 05/30/24 1018 05/30/24 1058  WBC 8.6 15.2*  --   NEUTROABS 7.0 14.0*  --   HGB 14.5 15.0 16.3*  HCT 46.9* 52.2* 48.0*  MCV 99.8 104.8*  --   PLT 217 194  --     Basic Metabolic Panel: Recent Labs  Lab 05/29/24 2007 05/30/24 1018 05/30/24 1058  NA 142 139 137  K 4.4 5.0 4.9  CL 98 98  --   CO2 35* 32  --   GLUCOSE 121* 89  --   BUN 19 20  --   CREATININE 1.04* 0.97  --   CALCIUM  9.2 9.1  --     GFR: Estimated Creatinine Clearance: 22.7 mL/min (by C-G formula based on SCr of 0.97 mg/dL).  Liver Function Tests: Recent Labs  Lab 05/30/24 1018  AST 24  ALT 12  ALKPHOS 72  BILITOT 0.4  PROT 6.9  ALBUMIN  3.5    Urine analysis:    Component Value Date/Time   COLORURINE YELLOW 05/30/2024 1151   APPEARANCEUR CLEAR 05/30/2024 1151   LABSPEC 1.028 05/30/2024 1151   PHURINE 5.0 05/30/2024 1151   GLUCOSEU NEGATIVE 05/30/2024 1151   HGBUR NEGATIVE 05/30/2024 1151   BILIRUBINUR NEGATIVE 05/30/2024 1151   KETONESUR NEGATIVE 05/30/2024 1151   PROTEINUR 30 (A) 05/30/2024 1151   NITRITE NEGATIVE 05/30/2024 1151   LEUKOCYTESUR NEGATIVE 05/30/2024 1151    Radiological Exams on Admission: CT ABDOMEN PELVIS W CONTRAST Result Date: 05/30/2024 CLINICAL DATA:  Abdominal pain EXAM: CT ABDOMEN AND PELVIS WITH CONTRAST TECHNIQUE: Multidetector CT  imaging of the abdomen and pelvis was performed  using the standard protocol following bolus administration of intravenous contrast. RADIATION DOSE REDUCTION: This exam was performed according to the departmental dose-optimization program which includes automated exposure control, adjustment of the mA and/or kV according to patient size and/or use of iterative reconstruction technique. CONTRAST:  65mL OMNIPAQUE  IOHEXOL  350 MG/ML SOLN COMPARISON:  None Available.  CT abdomen pelvis Feb 18 2024 FINDINGS: Exam is very suboptimal due to patient's motion artifact. Lower chest: Calcified nodule anterior aspect of right middle lobe. Scattered additional calcified micro nodules suggestive of prior granulomatous disease. Severe bronchial and bronchiolar wall thickening. Subsegmental atelectasis in right lung base. Atherosclerotic calcifications of coronary arteries. Hepatobiliary: No focal liver abnormality is seen. No gallstones, gallbladder wall thickening, or biliary dilatation. Pancreas: Unremarkable. No pancreatic ductal dilatation or surrounding inflammatory changes. Spleen: Normal in size without focal abnormality. Adrenals/Urinary Tract: Adrenal glands are unremarkable. Bilateral nonobstructive nephrolithiasis measuring up to 4 mm. Right kidney inferolateral hypodense lesion with postcontrast enhancement. Evaluation is suboptimal given the patient's motion artifact no hydronephrosis. Bladder is unremarkable. Stomach/Bowel: Prominent gastric folds suggestive of gastritis. Appendix is not visualized. Large stool throughout the colon obscures underlying detail. Colonic diverticulosis. No suspicious bowel wall thickening. No abnormal dilation. Severe atherosclerotic calcifications of aorta and branches. No enlarged abdominal or pelvic lymph nodes. Reproductive: Hysterectomy.  No adnexal mass. Other: No abdominal wall hernia or abnormality. No abdominopelvic ascites. Musculoskeletal: Interval increase compression of  deformity of the L3 and L5 vertebral bodies. Additional multilevel compression deformities of the thoracolumbar spine stable to prior. IMPRESSION: Suboptimal exam due to motion artifact. Interval increase in compression deformities of L3 and L5 vertebral bodies. Colonic diverticulosis without definite findings to suggest diverticulitis. Prominent gastric folds suggestive of gastritis. Correlate with clinical findings. Atherosclerotic calcifications of aorta and coronary arteries. Electronically Signed   By: Megan  Zare M.D.   On: 05/30/2024 12:00   CT HEAD WO CONTRAST Result Date: 05/30/2024 EXAM: CT HEAD WITHOUT CONTRAST 05/30/2024 11:19:04 AM TECHNIQUE: CT of the head was performed without the administration of intravenous contrast. Automated exposure control, iterative reconstruction, and/or weight based adjustment of the mA/kV was utilized to reduce the radiation dose to as low as reasonably achievable. COMPARISON: None available. CLINICAL HISTORY: Mental status change, persistent or worsening. FINDINGS: BRAIN AND VENTRICLES: No acute hemorrhage. Gray-white differentiation is preserved. No hydrocephalus. No extra-axial collection. No mass effect or midline shift. Nonspecific hypoattenuation in the periventricular and subcortical white matter, most likely representing chronic small vessel disease. Mild volume loss. ORBITS: No acute abnormality. SINUSES: No acute abnormality. SOFT TISSUES AND SKULL: No acute soft tissue abnormality. No skull fracture. VASCULATURE: Atherosclerosis involving the carotid siphons and intracranial vertebral arteries bilaterally. IMPRESSION: 1. No acute intracranial abnormality. 2. Nonspecific hypoattenuation in the periventricular and subcortical white matter, most likely representing chronic small vessel disease. 3. Mild volume loss. Electronically signed by: Donnice Mania MD 05/30/2024 11:58 AM EDT RP Workstation: [id number]   DG Chest Portable 1 View Result Date:  05/29/2024 CLINICAL DATA:  Palpitations and chest pain EXAM: PORTABLE CHEST 1 VIEW COMPARISON:  04/16/2024 FINDINGS: Stable cardiomediastinal silhouette. Aortic atherosclerotic calcification. Diffuse interstitial coarsening is similar to prior. Reticulonodular opacities in the bilateral upper lobes. The patient's chin obscures the right apex. No pleural effusion or pneumothorax. No displaced rib fracture. IMPRESSION: Reticulonodular opacities in the bilateral upper lobes may be due small airway infection or inflammation. Electronically Signed   By: Norman Gatlin M.D.   On: 05/29/2024 19:57   EKG: Independently reviewed.  Sinus rhythm at 60 beats  minute.  Right bundle branch block with QRS of 120.  Similar to previous.  Assessment/Plan Active Problems:   S/P primary angioplasty with coronary stent   COPD (chronic obstructive pulmonary disease) (HCC)   Chronic diastolic CHF (congestive heart failure) (HCC)   Chronic pain   Stage 3a chronic kidney disease (CKD) (HCC)   Hyperlipidemia   Major depression   Atherosclerotic heart disease of native coronary artery without angina pectoris   Chronic respiratory failure with hypoxia (HCC)   Essential hypertension   Altered mental status With somnolence Abnormal chest x-ray, ?Infectious changes Leukocytosis > Persistently somnolent while observed in the ED for the past 18 hours. > Unclear etiology.  Suspicion is medication effect as she is on oxycodone  for chronic pain and as well as Valium .  She has had issues with intermittent sleepiness for the past several months and took something after a heated discussion just prior to being transported to the ED.  Suspicion is she took a Valium  and this is medication effect from that and that it has been affecting her on and off for the last several months. > In addition to her medications, imaging notable for possible small airway infection versus inflammation as well as possible gastritis.  Did not have a  leukocytosis on presentation but today white count elevated to 15.2.  Could be reactive versus infectious so we will treat to cover this for now.  Will also check for viral etiology. > CT head without acute abnormality.  Urinalysis with protein and rare bacteria only.  Ammonia level mildly elevated 56 with normal LFTs and no changes of cirrhosis on imaging.  Ethanol level pending.  UDS is positive for benzos as above.  Troponin flat at 24, 23.  VBG with normal pH and pCO2 elevated to 64.5 with normal bicarb.  - Monitor on telemetry overnight - Continue with supplemental oxygen, wean as tolerated - Continue hold centrally acting medications Valium , oxycodone , sertraline  - Check TSH, EEG. - Ceftriaxone , azithromycin  - Procalcitonin, screen for flu, RSV, COVID - Repeat VBG to monitor trend - If patient fails to respond to initial interventions or monitoring consider neurology consult versus further imaging  Hypertension - Continue home losartan , spironolactone , metoprolol   Hyperlipidemia - Continue rosuvastatin   CKD 3A - Creatinine stable - Trend renal function and electrolytes  CAD - Status post stent - Continue home metoprolol , losartan , ASA, rosuvastatin   Chronic diastolic CHF > Last echo was in April of this year with EF C5-6 percent, G1 DD, normal RV function.  COPD Chronic respiratory failure with hypoxia > Family states she is not frequently using the oxygen she is prescribed and has sent it back at times. - Replace home Symbicort  with family of Breo - As needed DuoNeb - Continue home oxygen  Anxiety Depression - Holding sertraline  and Valium  as above  Chronic pain - Home oxycodone  as above  DVT prophylaxis: Heparin  Code Status:   DNR/DNI Family Communication:  Updated at bedside  Disposition Plan:   Patient is from:  Home  Anticipated DC to:  Home  Anticipated DC date:  1 to 3 days  Anticipated DC barriers: None  Consults called:  None Admission status:   Observation, telemetry  Severity of Illness: The appropriate patient status for this patient is OBSERVATION. Observation status is judged to be reasonable and necessary in order to provide the required intensity of service to ensure the patient's safety. The patient's presenting symptoms, physical exam findings, and initial radiographic and laboratory data in the context of  their medical condition is felt to place them at decreased risk for further clinical deterioration. Furthermore, it is anticipated that the patient will be medically stable for discharge from the hospital within 2 midnights of admission.    Marsa KATHEE Scurry MD [facility]  How to contact the [facility] Attending or Consulting provider 7A - 7P or covering provider during after hours 7P -7A, for this patient?   Check the care team in [facility] and look for a) attending/consulting [facility] provider listed and b) the [facility] team listed Log into [email or web address] and use 's universal password to access. If you do not have the password, please contact the hospital operator. Locate the [facility] provider you are looking for under [facility] and page to a number that you can be directly reached. If you still have difficulty reaching the provider, please page the [facility] (Director on Call) for the Hospitalists listed on amion for assistance.  05/30/2024, 1:30 PM

## 2024-05-30 NOTE — ED Notes (Signed)
 Patient transported to CT

## 2024-05-30 NOTE — Significant Event (Addendum)
 Notified by RN of critical blood gas VBG 7.11 / CO2 > 123, bicarb on last BMP was 32. VS Bp 97/40 MAP53, RN staff had increased O2 for sat in the low 90s which I advised to reduce to limit hyperoxia and further CO2 retention. On arrival she is somnolent, she awakens to moderate nonpainful stimulation and falls back asleep quickly. Unable to assess her orientation. She is DNR/DNI. Although mental status not ideal for BiPAP therapy will initiate to stabilize and hopefully improve her hypercarbia. I will call to update family   A/P  Acute hypercarbic respiratory failure  -- Transfer to progressive LOC  -- Rescue BiPAP, RT at bedside to set up  -- Repeat VBG within 1 hr to assess for improvement  -- Start on scheduled duonebs.  -- Strict NPO.   Hypotension  -- Give 1 L NS  -- Hold antihypertensives    Update:  Remains with severe range hypercarbia after adjustment in BiPAP parameters. PCCM consulted and adjusted again, repeat VBG essentially unchanged. I called and spoke with patients family, daughter Stephane reports she is mPOA. We discussed her critical illness, and options at this point for continued trial of BiPAP v intubation and patients prior wishes as understood by [facility]. Dawn reports she had wanted to be DNR, although wishes around intubation were less clear. At this point daughter would like to rescind DNI and thinks patient would be open to intubation if ICU team thinks this would be beneficial. Discussed that intubation comes with additional risks and ultimately may not be successful. And that even if has improvement that she may decompensate again following extubation. They are understanding and open to ICU team decision re: best next steps for either continued bipap v intubation. Contact number Stephane Glatter [phone]. She will be coming to the hospital tonight   Update:  I informed ICU team of rescinded DNI status. Per their assessment would be very difficult to liberate from vent if  intubated and decided to trial continued BiPAP overnight. Repeat VBG is pending this morning and we as well as PCCM team will follow this result   Dorn Dawson, MD  [facility]   CRITICAL CARE Performed by: Dorn Dawson   Total critical care time: 35 minutes  Critical care time was exclusive of separately billable procedures and treating other patients.  Critical care was necessary to treat or prevent imminent or life-threatening deterioration.  Critical care was time spent personally by me on the following activities: development of treatment plan with patient and/or surrogate as well as nursing, discussions with consultants, evaluation of patient's response to treatment, examination of patient, obtaining history from patient or surrogate, ordering and performing treatments and interventions, ordering and review of laboratory studies, ordering and review of radiographic studies, pulse oximetry and re-evaluation of patient's condition.

## 2024-05-30 NOTE — ED Notes (Signed)
 This paramedic tried to call pt daughter for update but she did not answer

## 2024-05-30 NOTE — Plan of Care (Signed)

## 2024-05-30 NOTE — ED Notes (Signed)
 Pt O2 dropped to 83% on 2L. This paramedic put NRB on at 15L until O2 came back up. She was then returned to 2l [place] and O2 was 96%

## 2024-05-31 DIAGNOSIS — F329 Major depressive disorder, single episode, unspecified: Secondary | ICD-10-CM | POA: Diagnosis present

## 2024-05-31 DIAGNOSIS — E7220 Disorder of urea cycle metabolism, unspecified: Secondary | ICD-10-CM | POA: Diagnosis present

## 2024-05-31 DIAGNOSIS — F419 Anxiety disorder, unspecified: Secondary | ICD-10-CM | POA: Diagnosis present

## 2024-05-31 DIAGNOSIS — Z66 Do not resuscitate: Secondary | ICD-10-CM | POA: Diagnosis present

## 2024-05-31 DIAGNOSIS — R64 Cachexia: Secondary | ICD-10-CM | POA: Diagnosis present

## 2024-05-31 DIAGNOSIS — E8729 Other acidosis: Secondary | ICD-10-CM | POA: Diagnosis present

## 2024-05-31 DIAGNOSIS — Z955 Presence of coronary angioplasty implant and graft: Secondary | ICD-10-CM | POA: Diagnosis not present

## 2024-05-31 DIAGNOSIS — J9602 Acute respiratory failure with hypercapnia: Secondary | ICD-10-CM

## 2024-05-31 DIAGNOSIS — I5032 Chronic diastolic (congestive) heart failure: Secondary | ICD-10-CM | POA: Diagnosis present

## 2024-05-31 DIAGNOSIS — N1831 Chronic kidney disease, stage 3a: Secondary | ICD-10-CM | POA: Diagnosis present

## 2024-05-31 DIAGNOSIS — Z7951 Long term (current) use of inhaled steroids: Secondary | ICD-10-CM | POA: Diagnosis not present

## 2024-05-31 DIAGNOSIS — J9622 Acute and chronic respiratory failure with hypercapnia: Secondary | ICD-10-CM | POA: Diagnosis present

## 2024-05-31 DIAGNOSIS — J449 Chronic obstructive pulmonary disease, unspecified: Secondary | ICD-10-CM | POA: Diagnosis not present

## 2024-05-31 DIAGNOSIS — Z79899 Other long term (current) drug therapy: Secondary | ICD-10-CM | POA: Diagnosis not present

## 2024-05-31 DIAGNOSIS — I2510 Atherosclerotic heart disease of native coronary artery without angina pectoris: Secondary | ICD-10-CM | POA: Diagnosis present

## 2024-05-31 DIAGNOSIS — I5030 Unspecified diastolic (congestive) heart failure: Secondary | ICD-10-CM | POA: Diagnosis not present

## 2024-05-31 DIAGNOSIS — Z9981 Dependence on supplemental oxygen: Secondary | ICD-10-CM | POA: Diagnosis not present

## 2024-05-31 DIAGNOSIS — J9621 Acute and chronic respiratory failure with hypoxia: Secondary | ICD-10-CM | POA: Diagnosis present

## 2024-05-31 DIAGNOSIS — G9340 Encephalopathy, unspecified: Secondary | ICD-10-CM | POA: Diagnosis not present

## 2024-05-31 DIAGNOSIS — Z7982 Long term (current) use of aspirin: Secondary | ICD-10-CM | POA: Diagnosis not present

## 2024-05-31 DIAGNOSIS — Z515 Encounter for palliative care: Secondary | ICD-10-CM | POA: Diagnosis not present

## 2024-05-31 DIAGNOSIS — G8929 Other chronic pain: Secondary | ICD-10-CM | POA: Diagnosis present

## 2024-05-31 DIAGNOSIS — J4489 Other specified chronic obstructive pulmonary disease: Secondary | ICD-10-CM | POA: Diagnosis present

## 2024-05-31 DIAGNOSIS — R400 Somnolence: Secondary | ICD-10-CM | POA: Diagnosis present

## 2024-05-31 DIAGNOSIS — F17210 Nicotine dependence, cigarettes, uncomplicated: Secondary | ICD-10-CM | POA: Diagnosis present

## 2024-05-31 DIAGNOSIS — G9341 Metabolic encephalopathy: Secondary | ICD-10-CM | POA: Diagnosis present

## 2024-05-31 DIAGNOSIS — I130 Hypertensive heart and chronic kidney disease with heart failure and stage 1 through stage 4 chronic kidney disease, or unspecified chronic kidney disease: Secondary | ICD-10-CM | POA: Diagnosis present

## 2024-05-31 DIAGNOSIS — Z1152 Encounter for screening for COVID-19: Secondary | ICD-10-CM | POA: Diagnosis not present

## 2024-05-31 DIAGNOSIS — E785 Hyperlipidemia, unspecified: Secondary | ICD-10-CM | POA: Diagnosis present

## 2024-05-31 LAB — COMPREHENSIVE METABOLIC PANEL WITH GFR
ALT: 15 U/L (ref 0–44)
AST: 27 U/L (ref 15–41)
Albumin: 3.9 g/dL (ref 3.5–5.0)
Alkaline Phosphatase: 66 U/L (ref 38–126)
Anion gap: 11 (ref 5–15)
BUN: 34 mg/dL — ABNORMAL HIGH (ref 8–23)
CO2: 31 mmol/L (ref 22–32)
Calcium: 8.6 mg/dL — ABNORMAL LOW (ref 8.9–10.3)
Chloride: 102 mmol/L (ref 98–111)
Creatinine, Ser: 1.28 mg/dL — ABNORMAL HIGH (ref 0.44–1.00)
GFR, Estimated: 41 mL/min — ABNORMAL LOW (ref >60)
Glucose, Bld: 112 mg/dL — ABNORMAL HIGH (ref 70–99)
Potassium: 5.2 mmol/L — ABNORMAL HIGH (ref 3.5–5.1)
Sodium: 144 mmol/L (ref 135–145)
Total Bilirubin: 0.8 mg/dL (ref 0.0–1.2)
Total Protein: 6.8 g/dL (ref 6.5–8.1)

## 2024-05-31 LAB — LACTIC ACID, PLASMA: Lactic Acid, Venous: 1.3 mmol/L (ref 0.5–1.9)

## 2024-05-31 LAB — BLOOD GAS, VENOUS
Acid-base deficit: 0.7 mmol/L (ref 0.0–2.0)
Bicarbonate: 33 mmol/L — ABNORMAL HIGH (ref 20.0–28.0)
Drawn by: 8521
O2 Saturation: 77.5 %
Patient temperature: 36.4
pCO2, Ven: 111 mmHg (ref 44–60)
pH, Ven: 7.08 — CL (ref 7.25–7.43)
pO2, Ven: 40 mmHg (ref 32–45)

## 2024-05-31 LAB — CBC
HCT: 45 % (ref 36.0–46.0)
Hemoglobin: 13 g/dL (ref 12.0–15.0)
MCH: 30.4 pg (ref 26.0–34.0)
MCHC: 28.9 g/dL — ABNORMAL LOW (ref 30.0–36.0)
MCV: 105.4 fL — ABNORMAL HIGH (ref 80.0–100.0)
Platelets: 166 K/uL (ref 150–400)
RBC: 4.27 MIL/uL (ref 3.87–5.11)
RDW: 14.5 % (ref 11.5–15.5)
WBC: 11.8 K/uL — ABNORMAL HIGH (ref 4.0–10.5)
nRBC: 0 % (ref 0.0–0.2)

## 2024-05-31 LAB — PROCALCITONIN: Procalcitonin: <0.1 ng/mL

## 2024-05-31 MED ORDER — MORPHINE SULFATE (CONCENTRATE) 10 MG /0.5 ML PO SOLN: 5.0000 mg | ORAL | Status: DC | PRN

## 2024-05-31 MED ORDER — MORPHINE SULFATE (CONCENTRATE) 10 MG /0.5 ML PO SOLN
5.0000 mg | ORAL | Status: DC | PRN
  Administered 2024-06-01: 5 mg via ORAL

## 2024-05-31 MED ORDER — ONDANSETRON 4 MG PO TBDP: 4.0000 mg | ORAL_TABLET | Freq: Four times a day (QID) | ORAL | Status: DC | PRN

## 2024-05-31 MED ORDER — LORAZEPAM 2 MG/ML PO CONC
1.0000 mg | ORAL | Status: DC | PRN
  Administered 2024-05-31: 1 mg via SUBLINGUAL
  Filled 2024-05-31: qty 1

## 2024-05-31 MED ORDER — LORAZEPAM 1 MG PO TABS: 1.0000 mg | ORAL_TABLET | ORAL | Status: DC | PRN

## 2024-05-31 MED ORDER — ARFORMOTEROL TARTRATE 15 MCG/2ML IN NEBU
15.0000 ug | INHALATION_SOLUTION | Freq: Two times a day (BID) | RESPIRATORY_TRACT | Status: DC
  Administered 2024-05-31: 15 ug via RESPIRATORY_TRACT
  Filled 2024-05-31 (×2): qty 2

## 2024-05-31 MED ORDER — MORPHINE SULFATE (PF) 2 MG/ML IV SOLN
0.5000 mg | INTRAVENOUS | Status: DC | PRN
  Administered 2024-05-31 (×2): 0.5 mg via INTRAVENOUS
  Filled 2024-05-31 (×2): qty 1

## 2024-05-31 MED ORDER — ONDANSETRON HCL 4 MG/2ML IJ SOLN: 4.0000 mg | Freq: Four times a day (QID) | INTRAMUSCULAR | Status: DC | PRN

## 2024-05-31 MED ORDER — LORAZEPAM 2 MG/ML IJ SOLN: 1.0000 mg | INTRAMUSCULAR | Status: DC | PRN

## 2024-05-31 MED ORDER — BUDESONIDE 0.25 MG/2ML IN SUSP
0.2500 mg | Freq: Two times a day (BID) | RESPIRATORY_TRACT | Status: DC
  Administered 2024-05-31: 0.25 mg via RESPIRATORY_TRACT
  Filled 2024-05-31 (×2): qty 2

## 2024-05-31 MED ORDER — MORPHINE SULFATE (PF) 2 MG/ML IV SOLN
1.0000 mg | INTRAVENOUS | Status: DC | PRN
  Administered 2024-05-31: 2 mg via INTRAVENOUS
  Administered 2024-06-01: 4 mg via INTRAVENOUS
  Administered 2024-06-01: 2 mg via INTRAVENOUS
  Administered 2024-06-01: 4 mg via INTRAVENOUS
  Filled 2024-05-31 (×2): qty 1
  Filled 2024-05-31 (×2): qty 2
  Filled 2024-05-31: qty 1
  Filled 2024-05-31: qty 2

## 2024-05-31 NOTE — Progress Notes (Signed)
 1105: Patient not tolerating BIPAP as evidenced by arms flailing and patient stating it's too much. Family would like pt to be off BIPAP and on nasal cannula supplemental oxygen for now to allow her to talk to family and be more comfortable. Pt switched to High flow nasal cannula at 15 L.

## 2024-05-31 NOTE — Progress Notes (Signed)
 Received notification via phone call pt has critical lab values of VBG (see chart). MD notified.

## 2024-05-31 NOTE — Progress Notes (Addendum)
 Chaplain responded to page. Family requests Catholic priest for Anointing of the Sick. I met with them and have reached out to Fr. Marinell (via voicemail).  Family understands that if he is not able to arrive by the time they need him, chaplains are available to be present, anoint, and pray with the family as needed. Please reach out to us .  [phone] Elia Czar understands from follow up phone call with Father Marinell; Father Marinell plans to visit today around 3-4pm.  [phone]

## 2024-05-31 NOTE — Plan of Care (Signed)
  Problem: Clinical Measurements: Goal: Diagnostic test results will improve 05/31/2024 0801 by Edith Darice BROCKS, RN Outcome: Not Progressing 05/31/2024 0801 by Edith Darice BROCKS, RN Outcome: Not Progressing Goal: Respiratory complications will improve Outcome: Not Progressing   Problem: Activity: Goal: Risk for activity intolerance will decrease Outcome: Not Progressing

## 2024-05-31 NOTE — Progress Notes (Signed)
 PROGRESS NOTE  Julia Hernandez  DOB: September 27, 1936  PCP: Cleotilde Planas, MD FMW:[id number]  DOA: 05/29/2024  LOS: 0 days  Hospital Day: 3  Brief narrative: Julia Hernandez is a 88 y.o. female with PMH significant for HTN, HLD, CAD/stent, CHF, COPD on 2 L oxygen, chronic smoker, CKD 3A, chronic back pain  8/18, patient presented to the ED with complaint of chest pain, persistent somnolence, progressively worsening bilateral lower extremity edema and worsening somnolence She had been on oxycodone  and Valium  chronically. In the ED, she was found to be hypersomnolent which did not improve in several hours in the ED and hence worked up for other possible etiologies.  Initial blood gas showed mild CO2 retention at 64 but VBG repeated in next several hours showed pH low at 7.11 with pCO2 more than 123,  WBC count worsened as well. CT head unremarkable Started on BiPAP Patient was admitted to [facility] Given significantly elevated CO2 level, critical care was consulted  Subjective: Patient was seen and examined this morning. Critical care discussed with patient's daughter this morning.  She confirmed DNR/DNI status. She wanted family to show up after which tentative plan would be to switch to comfort care status.  Her stop At the time of my evaluation, patient was on oxygen high flow nasal cannula.  Daughter and her friend at bedside. Patient had involuntary nodding of her head but did not look uncomfortable.  Earlier in the morning, patient was awake and complained of back pain and hence after discussion with the family, IV morphine  1 dose was given.  Assessment and plan: Acute on chronic respiratory failure with hypoxia and hypercapnia Was on BiPAP last night On high flow oxygen by nasal cannula this morning Critical care consult appreciated DNR/DNI confirmed with family Plan for initiation of comfort care measures after more family members visit her this afternoon  Acute metabolic  encephalopathy Likely secondary to hypercapnia, hyperammonemia as well as use of Valium    Hypertension losartan , spironolactone , metoprolol    Hyperlipidemia rosuvastatin    CKD 3A Creatinine stable   CAD s/p stent Continue home metoprolol , losartan , ASA, rosuvastatin      Anxiety/Depression Holding sertraline  and Valium  as above   Chronic pain Home oxycodone  as above    Goals of care   Code Status: Do not attempt resuscitation (DNR) - Comfort care     DVT prophylaxis:  heparin  injection 5,000 Units Start: 05/30/24 1400   Antimicrobials: Empiric antibiotics Fluid: None Consultants: Critical care Family Communication: Daughter at bedside  Status: Inpatient Level of care:  Progressive   Patient is from: Home Needs to continue in-hospital care: heading towards comfort care     Diet:  Diet Order             Diet NPO time specified  Diet effective now                   Scheduled Meds:  arformoterol   15 mcg Nebulization BID   aspirin  EC  81 mg Oral Daily   B-complex with vitamin C  1 tablet Oral Daily   budesonide  (PULMICORT ) nebulizer solution  0.25 mg Nebulization BID   feeding supplement  237 mL Oral BID BM   ferrous sulfate   325 mg Oral Q breakfast   heparin   5,000 Units Subcutaneous Q8H   ipratropium-albuterol   3 mL Nebulization Q6H   pantoprazole   40 mg Oral Daily   rosuvastatin   5 mg Oral QODAY   sodium chloride  flush  3 mL Intravenous Q12H  PRN meds: acetaminophen  **OR** acetaminophen , ipratropium-albuterol , morphine  injection, polyethylene glycol   Infusions:   azithromycin  500 mg (05/30/24 1713)   cefTRIAXone  (ROCEPHIN )  IV 1 g (05/30/24 1835)    Antimicrobials: Anti-infectives (From admission, onward)    Start     Dose/Rate Route Frequency Ordered Stop   05/30/24 1400  cefTRIAXone  (ROCEPHIN ) 1 g in sodium chloride  0.9 % 100 mL IVPB        1 g 200 mL/hr over 30 Minutes Intravenous Every 24 hours 05/30/24 1350     05/30/24 1400   azithromycin  (ZITHROMAX ) 500 mg in sodium chloride  0.9 % 250 mL IVPB        500 mg 250 mL/hr over 60 Minutes Intravenous Every 24 hours 05/30/24 1350         Objective: Vitals:   05/31/24 1100 05/31/24 1151  BP:  (!) 88/34  Pulse:  (!) 103  Resp:  18  Temp:  (!) 97.5 F (36.4 C)  SpO2: 98% 100%    Intake/Output Summary (Last 24 hours) at 05/31/2024 1321 Last data filed at 05/31/2024 0100 Gross per 24 hour  Intake 0 ml  Output --  Net 0 ml   Filed Weights   05/29/24 1856 05/30/24 2347 05/31/24 0600  Weight: 39 kg 42.2 kg 43.6 kg   Weight change: 3.191 kg Body mass index is 23.18 kg/m.   Physical Exam: General exam: Pleasant, elderly Caucasian female Skin: No rashes, lesions or ulcers. HEENT: Atraumatic, normocephalic, no obvious bleeding Lungs: Clear to auscultation bilaterally,  CVS: S1, S2, no murmur,   GI/Abd: Soft, nontender, nondistended, bowel sound present,   CNS: Opens eyes on command, involuntary nodding of her head noticed. Extremities: No pedal edema, no calf tenderness,   Data Review: I have personally reviewed the laboratory data and studies available.  F/u labs  Unresulted Labs (From admission, onward)    None      Signed, Chapman Rota, MD [facility] 05/31/2024

## 2024-05-31 NOTE — IPAL (Signed)
  Interdisciplinary Goals of Care Family Meeting   Date carried out: 05/31/2024  Location of the meeting: Bedside  Member's involved: Physician and Family Member or next of kin  Durable Power of Attorney or acting medical decision maker:  Julia Hernandez  Discussion: We discussed goals of care for Julia Hernandez .  Patient with acute hypercapnic respiratory failure, not responding to BIPAP. Discussed options of treatment including pursuing comfort measures, keeping on BIPAP and reversal of code status for intubation. Julia Hernandez would like to honor her mother's wishes of DNR. She will keep her on the BIPAP and discuss with her family and get back to me soon regarding comfort measures goal.   In the meantime I increased her BIPAP PS to 20 over 5.    Code status:   Code Status: Do not attempt resuscitation (DNR) PRE-ARREST INTERVENTIONS DESIRED   Disposition: Continue current acute care  Time spent for the meeting: 12 minutes     Julia LOISE Herter, MD  05/31/2024, 8:11 AM

## 2024-05-31 NOTE — Consult Note (Signed)
 NAME:  Kataleena Holsapple, MRN:  [id number], DOB:  May 02, 1936, LOS: 0 ADMISSION DATE:  05/29/2024 CONSULTATION DATE:  05/31/24 REFERRING MD:  [place] - [facility], CHIEF COMPLAINT:  Acute hypercarbic respiratory failure  History of Present Illness:  88 year old woman who presented to [facility] 8/18 for CP. PMHx significant for HTN, HLD, CAD, HFpEF (Echo 01/2024 EF 55-60%, G1DD), asthma/COPD (on 2L HOT with activity), tobacco use, CKD stage 3a.  Patient presented to [facility] ED with CP and palpitations (transient). Also notes RLE swelling for the last several weeks. US  RLE Doppler were completed 7/6 and negative for DVT. Initial ED workup was reassuring with plan for discharge home; however when provider went to wake patient and she was profoundly somnolent, opening eyes for a few seconds then falling back to sleep. Family endorses somnolence as a common issue at home; does take Valium  and oxycodone  as home medications. Concern for polypharmacy, patient continued to be monitored in the ED but unfortunately had persistent AMS requiring admission.  In ED, labs were notable for initial WBC 8.6 (repeat 15.2), Hgb 14.5, Plt 217. Na 142, K 4.4, CO2 35, BUN/Cr 19/1.04. Ammonia 56. D-dimer WNL. Trop 24 > 23. TSH 0.6. VBG 7.35/pCO2 64.8/bicarb 35.7. LA 1.4. UDS +BZDs (home Valium ). Ethanol negative. UA with small protein, rare bacteria. PCT < 0.10. CT Head NAICA, CT A/P without obvious intraabdominal pathology. Repeat VBG 8/19 showed pH 7.11/pCO2 >123/bicarb 41, K 5.2.   Patient was placed on BiPAP. PCCM consulted for management and recommendations. Of note, patient is DNR/DNI.  Pertinent Medical History:   Past Medical History:  Diagnosis Date   AKI (acute kidney injury) (HCC) 03/20/2022   Asthma    COPD (chronic obstructive pulmonary disease) (HCC)    Hypertension    Significant Hospital Events: Including procedures, antibiotic start and stop dates in addition to other pertinent events   8/18 - Presented to [facility] for  CP.  Interim History / Subjective:  PCCM consulted for management of hypercarbic respiratory failure.  Objective:  Blood pressure (!) 95/44, pulse 85, temperature (!) 97.5 F (36.4 C), temperature source Axillary, resp. rate (!) 21, height 4' 6 (1.372 m), weight 43.6 kg, SpO2 92%.    FiO2 (%):  [30 %-40 %] 40 % PEEP:  [4 cmH20-5 cmH20] 5 cmH20   Intake/Output Summary (Last 24 hours) at 05/31/2024 1049 Last data filed at 05/31/2024 0100 Gross per 24 hour  Intake 0 ml  Output --  Net 0 ml   Filed Weights   05/29/24 1856 05/30/24 2347 05/31/24 0600  Weight: 39 kg 42.2 kg 43.6 kg   Physical Examination: General:  ill appearing elederly female, unresponsive  HEENT:  Neuro: Lethargic and somnolent. Not following any commands CV: RRR, Nl S1, S2  PULM: Coarse crackles throughout both lungs  GI: Soft, nontender, nondistended. Normoactive bowel sounds. Extremities: Bilateral symmetric 1+ chronic-appearing LE edema noted. Skin: Warm/dry, no rashes. +Pulmonary cachexia.  Resolved Hospital Problem List:    Assessment & Plan:  Acute hypercarbic respiratory failure COPD on 2L HOT Pulmonary cachexia Tobacco use HFpEF (Echo 01/2024 EF 55-60%, G1DD)  Patient with poor response to BIPAP. I increased her BIPAP settings to IPAP 20 and PEEP of 5. Discussed with daughter BIPAP failure and critical hypercapnia. Discussed options of treatment and family would like to honor Ms Branaman's wishes of DNR. She will stay on BIPAP for now until family arrives. No plan for escalation of care.   Reached out to patient and daughter later in the day. They elected  to change her to a nasal cannula and give her some pain meds. Code status was changed to comfort measures only.   Pulmonary will signoff at this time. Please reach out for any questions or concerns   Best Practice: (right click and Reselect all SmartList Selections daily)   Per Primary Team  Labs:  CBC: Recent Labs  Lab 05/29/24 2007  05/30/24 1018 05/30/24 1058 05/31/24 0700  WBC 8.6 15.2*  --  11.8*  NEUTROABS 7.0 14.0*  --   --   HGB 14.5 15.0 16.3* 13.0  HCT 46.9* 52.2* 48.0* 45.0  MCV 99.8 104.8*  --  105.4*  PLT 217 194  --  166   Basic Metabolic Panel: Recent Labs  Lab 05/29/24 2007 05/30/24 1018 05/30/24 1058 05/30/24 1956 05/30/24 2114 05/31/24 0700  NA 142 139 137  --  138 144  K 4.4 5.0 4.9  --  5.2* 5.2*  CL 98 98  --   --  96* 102  CO2 35* 32  --   --  32 31  GLUCOSE 121* 89  --   --  104* 112*  BUN 19 20  --   --  29* 34*  CREATININE 1.04* 0.97  --  0.99 1.02* 1.28*  CALCIUM  9.2 9.1  --   --  8.2* 8.6*  MG  --   --   --   --  1.9  --   PHOS  --   --   --   --  6.8*  --    GFR: Estimated Creatinine Clearance: 18.2 mL/min (A) (by C-G formula based on SCr of 1.28 mg/dL (H)). Recent Labs  Lab 05/29/24 2007 05/30/24 1018 05/30/24 1059 05/30/24 1956 05/30/24 2114 05/31/24 0700  PROCALCITON  --   --   --  <0.10  --  <0.10  WBC 8.6 15.2*  --   --   --  11.8*  LATICACIDVEN  --   --  1.4  --  0.8 1.3   Liver Function Tests: Recent Labs  Lab 05/30/24 1018 05/31/24 0700  AST 24 27  ALT 12 15  ALKPHOS 72 66  BILITOT 0.4 0.8  PROT 6.9 6.8  ALBUMIN  3.5 3.9   No results for input(s): LIPASE, AMYLASE in the last 168 hours. Recent Labs  Lab 05/30/24 1018 05/30/24 2114  AMMONIA 56* 57*   ABG:    Component Value Date/Time   HCO3 33.0 (H) 05/31/2024 0655   TCO2 38 (H) 05/30/2024 1058   ACIDBASEDEF 0.7 05/31/2024 0655   O2SAT 77.5 05/31/2024 0655    Coagulation Profile: No results for input(s): INR, PROTIME in the last 168 hours.  Cardiac Enzymes: No results for input(s): CKTOTAL, CKMB, CKMBINDEX, TROPONINI in the last 168 hours.  HbA1C: No results found for: HGBA1C  CBG: Recent Labs  Lab 05/30/24 1045  GLUCAP 78   Review of Systems:   Patient is encephalopathic on BiPAP; therefore, history has been obtained from chart review.   Past Medical  History:  She,  has a past medical history of AKI (acute kidney injury) (HCC) (03/20/2022), Asthma, COPD (chronic obstructive pulmonary disease) (HCC), and Hypertension.   Surgical History:   Past Surgical History:  Procedure Laterality Date   BALLOON DILATION N/A 03/24/2022   Procedure: BALLOON DILATION;  Surgeon: Saintclair Jasper, MD;  Location: [facility];  Service: Gastroenterology;  Laterality: N/A;   BALLOON DILATION N/A 09/03/2023   Procedure: BALLOON DILATION;  Surgeon: Saintclair Jasper, MD;  Location: [facility];  Service: Gastroenterology;  Laterality: N/A;   BOTOX  INJECTION  09/03/2023   Procedure: BOTOX  INJECTION;  Surgeon: Saintclair Jasper, MD;  Location: [facility];  Service: Gastroenterology;;   ESOPHAGOGASTRODUODENOSCOPY N/A 03/24/2022   Procedure: ESOPHAGOGASTRODUODENOSCOPY (EGD);  Surgeon: Saintclair Jasper, MD;  Location: [facility];  Service: Gastroenterology;  Laterality: N/A;   ESOPHAGOGASTRODUODENOSCOPY (EGD) WITH PROPOFOL  N/A 09/03/2023   Procedure: ESOPHAGOGASTRODUODENOSCOPY (EGD) WITH PROPOFOL ;  Surgeon: Saintclair Jasper, MD;  Location: [facility];  Service: Gastroenterology;  Laterality: N/A;   IR RADIOLOGIST EVAL & MGMT  05/05/2024   Social History:   reports that she has been smoking cigarettes. She has a 30 pack-year smoking history. She has never used smokeless tobacco. She reports that she does not drink alcohol and does not use drugs.   Family History:  Her family history is not on file.   Allergies: Allergies  Allergen Reactions   Influenza Vac Split Quad Shortness Of Breath   Ditropan Xl [Oxybutynin] Other (See Comments)    Unknown reaction   Simvastatin Rash   Home Medications: Prior to Admission medications   Medication Sig Start Date End Date Taking? Authorizing Provider  acetaminophen  (TYLENOL ) 500 MG tablet Take 500 mg by mouth every 6 (six) hours as needed for moderate pain (pain score 4-6).    [provider]  acyclovir ointment (ZOVIRAX) 5 % Apply  1 application topically 3 (three) times daily as needed (outbreaks).    [provider]  aspirin  EC 81 MG tablet Take 1 tablet (81 mg total) by mouth daily. Swallow whole. 03/31/22   Patel, Pranav M, MD  B Complex-C (B-COMPLEX WITH VITAMIN C) tablet Take 1 tablet by mouth daily. 03/31/22   Patel, Pranav M, MD  Cyanocobalamin  (VITAMIN B-12) 3000 MCG SUBL Place 1 tablet under the tongue daily. 05/25/24   [provider]  diazepam  (VALIUM ) 5 MG tablet Take 0.5 tablets (2.5 mg total) by mouth at bedtime. 01/13/24   Perri DELENA Meliton Mickey., MD  ferrous sulfate  325 (65 FE) MG tablet Take 1 tablet (325 mg total) by mouth daily with breakfast. 09/04/23   Samtani, Jai-Gurmukh, MD  furosemide  (LASIX ) 20 MG tablet Take 1 tablet (20 mg total) by mouth daily as needed (weight gain of >3 lbs in 1 day or >5 lbs in 1 week). 03/30/22   Tobie Yetta HERO, MD  Ipratropium-Albuterol  (COMBIVENT  RESPIMAT) 20-100 MCG/ACT AERS respimat Inhale 1 puff into the lungs 4 (four) times daily as needed for shortness of breath. 01/13/24 03/13/24  Perri DELENA Meliton Mickey., MD  losartan  (COZAAR ) 25 MG tablet Take 0.5 tablets (12.5 mg total) by mouth daily. 09/24/23   Colletta Manuelita Garre, PA-C  magic mouthwash w/lidocaine  SOLN Take 5 mLs by mouth 4 (four) times daily as needed for mouth pain.    [provider]  methocarbamol  (ROBAXIN ) 500 MG tablet Take 1 tablet (500 mg total) by mouth every 8 (eight) hours as needed for up to 60 doses for muscle spasms. 05/05/24   Covington, Jamie R, NP  metoprolol  succinate (TOPROL -XL) 25 MG 24 hr tablet Take 50 mg by mouth daily. 12/28/23   [provider]  nystatin (MYCOSTATIN) 100000 UNIT/ML suspension Use as directed 4 mLs in the mouth or throat 4 (four) times daily. 05/29/24   [provider]  oxyCODONE  (OXY IR/ROXICODONE ) 5 MG immediate release tablet Take 5 mg by mouth 4 (four) times daily. 03/17/24   [provider]  pantoprazole  (PROTONIX ) 40 MG tablet Take  1 tablet (40 mg total) by mouth  daily. 03/31/22   Tobie Yetta HERO, MD  rosuvastatin  (CRESTOR ) 5 MG tablet Take 5 mg by mouth every other day. 12/28/23   [provider]  sertraline  (ZOLOFT ) 100 MG tablet Take 100 mg by mouth at bedtime.    [provider]  spironolactone  (ALDACTONE ) 25 MG tablet Take 1 tablet (25 mg total) by mouth daily. 09/24/23   Colletta Manuelita Garre, PA-C  SYMBICORT  160-4.5 MCG/ACT inhaler Inhale 2 puffs into the lungs 2 (two) times daily. 01/13/24   Perri DELENA Meliton Mickey., MD   Signature:   Zola LOISE Herter, MD [facility] 05/31/24 10:49 AM  Please see [email or web address] for pager details.

## 2024-06-01 DIAGNOSIS — G9340 Encephalopathy, unspecified: Secondary | ICD-10-CM | POA: Diagnosis not present

## 2024-06-01 MED ORDER — DIAZEPAM 5 MG/ML IJ SOLN
5.0000 mg | Freq: Four times a day (QID) | INTRAMUSCULAR | Status: DC | PRN
  Administered 2024-06-01: 5 mg via INTRAVENOUS
  Filled 2024-06-01: qty 2

## 2024-06-01 NOTE — Progress Notes (Signed)
 Spoke with pt's daughter Stephane (on contact information), informed her of pt transfer to [facility] rm 8.

## 2024-06-01 NOTE — Progress Notes (Signed)
 1152: Spoke with patient family via phone call Moses, son-in-law); Provided update as to pt status.

## 2024-06-01 NOTE — Progress Notes (Signed)
 PROGRESS NOTE  Julia Hernandez  DOB: 10-20-1935  PCP: Cleotilde Planas, MD FMW:[id number]  DOA: 05/29/2024  LOS: 1 day  Hospital Day: 4  Brief narrative: Julia Hernandez is a 88 y.o. female with PMH significant for HTN, HLD, CAD/stent, CHF, COPD on 2 L oxygen, chronic smoker, CKD 3A, chronic back pain  8/18, patient presented to the ED with complaint of chest pain, persistent somnolence, progressively worsening bilateral lower extremity edema and worsening somnolence She had been on oxycodone  and Valium  chronically. In the ED, she was found to be hypersomnolent which did not improve in several hours in the ED and hence worked up for other possible etiologies.  Initial blood gas showed mild CO2 retention at 64 but VBG repeated in next several hours showed pH low at 7.11 with pCO2 more than 123,  WBC count worsened as well. CT head unremarkable Started on BiPAP Patient was admitted to [facility] Given significantly elevated CO2 level, critical care was consulted  Subjective: Patient was seen and examined this morning. Slightly restless and was given morphine  earlier.  Per RN, patient was awake and talking last night. Family not at bedside. Uncomfortable at the time of my evaluation and hence I switched her from IV Ativan  to IV Valium  PRN Hospice care consulted  Assessment and plan: Comfort care status Initially admitted for acute exacerbation of respiratory failure, altered mental status. After discussion with family, DNR was confirmed and patient was switched to comfort care measures. Comfort care orders in place with IV morphine , IV Ativan  as needed Hospice consultation requested for possible inpatient hospice care  Other issues Acute on chronic respiratory failure with hypoxia and hypercapnia Acute metabolic encephalopathy Hypertension Hyperlipidemia CKD 3A CAD s/p stent Anxiety/Depression Chronic pain   Goals of care   Code Status: Do not attempt resuscitation (DNR) - Comfort care      DVT prophylaxis:     Antimicrobials: Empiric antibiotics Fluid: None Consultants: Critical care Family Communication: Daughter not at bedside today  Status: Inpatient Level of care:  Progressive   Patient is from: Home Needs to continue in-hospital care: heading towards comfort care     Diet:  Diet Order             Diet NPO time specified  Diet effective now                   Scheduled Meds:  B-complex with vitamin C  1 tablet Oral Daily   feeding supplement  237 mL Oral BID BM   ferrous sulfate   325 mg Oral Q breakfast   sodium chloride  flush  3 mL Intravenous Q12H    PRN meds: acetaminophen  **OR** acetaminophen , diazepam , ipratropium-albuterol , morphine  injection, morphine  CONCENTRATE **OR** morphine  CONCENTRATE, ondansetron  **OR** ondansetron  (ZOFRAN ) IV, polyethylene glycol   Infusions:     Antimicrobials: Anti-infectives (From admission, onward)    Start     Dose/Rate Route Frequency Ordered Stop   05/30/24 1400  cefTRIAXone  (ROCEPHIN ) 1 g in sodium chloride  0.9 % 100 mL IVPB  Status:  Discontinued        1 g 200 mL/hr over 30 Minutes Intravenous Every 24 hours 05/30/24 1350 05/31/24 1839   05/30/24 1400  azithromycin  (ZITHROMAX ) 500 mg in sodium chloride  0.9 % 250 mL IVPB  Status:  Discontinued        500 mg 250 mL/hr over 60 Minutes Intravenous Every 24 hours 05/30/24 1350 05/31/24 1839       Objective: Vitals:   05/31/24 1946 06/01/24 9083  BP: (!) 104/57 123/65  Pulse: 78 (!) 118  Resp: 19 (!) 22  Temp: 97.8 F (36.6 C) 99.4 F (37.4 C)  SpO2: 92% 90%    Intake/Output Summary (Last 24 hours) at 06/01/2024 1332 Last data filed at 05/31/2024 2300 Gross per 24 hour  Intake 0 ml  Output --  Net 0 ml   Filed Weights   05/29/24 1856 05/30/24 2347 05/31/24 0600  Weight: 39 kg 42.2 kg 43.6 kg   Weight change:  Body mass index is 23.18 kg/m.   Physical Exam: General exam: Pleasant, elderly Caucasian female CNS: Opens eyes  on command, slightly restless.  Otherwise comfortable  Data Review: I have personally reviewed the laboratory data and studies available.  F/u labs  Unresulted Labs (From admission, onward)    None      Signed, Chapman Rota, MD [facility] 06/01/2024

## 2024-06-01 NOTE — Plan of Care (Signed)
   Problem: Safety: Goal: Ability to remain free from injury will improve Outcome: Progressing

## 2024-06-01 NOTE — Progress Notes (Signed)
 Chaplain followed up with pt Julia Hernandez. No family bedside. Chaplain provided a short prayer, and we remain available as needs arise.

## 2024-06-01 NOTE — TOC CM/SW Note (Signed)
 Transition of Care [facility]) - Inpatient Brief Assessment   Patient Details  Name: Julia Hernandez MRN: [id number] Date of Birth: 1935/12/08  [facility] CM/SW Contact:    Waddell Barnie Rama, RN Phone Number: 06/01/2024, 4:34 PM   Clinical Narrative: From home, for residential hospice,   comfort care.   Transition of Care Asessment: Insurance and Status: Insurance coverage has been reviewed Patient has primary care physician: Yes Home environment has been reviewed: home   Prior/Current Home Services: No current home services Social Drivers of Health Review: SDOH reviewed no interventions necessary Readmission risk has been reviewed: Yes Transition of care needs: no transition of care needs at this time

## 2024-06-01 NOTE — Progress Notes (Signed)
 Patient very restless, thrashing legs, and constant moving and crying out. Morphine  4 mg IV as ordered for pain of 7/10 per FLACCS scale.  MAR has oral morphine  given but was given IV.  Patient resting quietly at this time and will recheck patient for continued needs.

## 2024-06-02 DIAGNOSIS — G9340 Encephalopathy, unspecified: Secondary | ICD-10-CM | POA: Diagnosis not present

## 2024-06-12 NOTE — Death Summary Note (Signed)
 DEATH SUMMARY   Patient Details  Name: Julia Hernandez MRN: [id number] DOB: Oct 18, 1935 ERE:Fpoozm, Olam, MD Admission/Discharge Information   Admit Date:  06-07-2024  Date of Death: Date of Death: 06/11/2024  Time of Death: Time of Death: 1430  Length of Stay: 2   Hospital Diagnoses: Principal Problem:   Acute encephalopathy Active Problems:   S/P primary angioplasty with coronary stent   COPD (chronic obstructive pulmonary disease) (HCC)   Chronic diastolic CHF (congestive heart failure) (HCC)   Chronic pain   Stage 3a chronic kidney disease (CKD) (HCC)   Hyperlipidemia   Major depression   Atherosclerotic heart disease of native coronary artery without angina pectoris   Chronic respiratory failure with hypoxia [facility])   Essential hypertension   Hypercarbia   Hospital Course: Julia Hernandez was a 88 y.o. female with PMH significant for HTN, HLD, CAD/stent, CHF, COPD on 2 L oxygen, chronic smoker, CKD 3A, chronic back pain  07-Jun-2024, patient presented to the ED with complaint of chest pain, persistent somnolence, progressively worsening bilateral lower extremity edema and worsening somnolence She had been on oxycodone  and Valium  chronically. In the ED, she was found to be hypersomnolent which did not improve in several hours in the ED and hence worked up for other possible etiologies.   Initial blood gas showed mild CO2 retention at 64 but VBG repeated in next several hours showed pH low at 7.11 with pCO2 more than 123,  WBC count worsened as well. CT head unremarkable Started on BiPAP Patient was admitted to [facility] Given significantly elevated CO2 level, critical care was consulted  Initially admitted for acute exacerbation of respiratory failure, altered mental status. After discussion with family, DNR was confirmed and patient was switched to comfort care measures. Comfort care orders in place with IV morphine , IV Ativan  as needed In the next 48 hours, patient's blood pressure  gradually worsened over.  She has been given as needed medications for comfort purposes.  Discussion was held with family on multiple occasions. Patient passed away on 06/11/2024 at 2:30 PM.  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   Significant Diagnostic Studies: CT ABDOMEN PELVIS W CONTRAST Result Date: 05/30/2024 CLINICAL DATA:  Abdominal pain EXAM: CT ABDOMEN AND PELVIS WITH CONTRAST TECHNIQUE: Multidetector CT imaging of the abdomen and pelvis was performed using the standard protocol following bolus administration of intravenous contrast. RADIATION DOSE REDUCTION: This exam was performed according to the departmental dose-optimization program which includes automated exposure control, adjustment of the mA and/or kV according to patient size and/or use of iterative reconstruction technique. CONTRAST:  65mL OMNIPAQUE  IOHEXOL  350 MG/ML SOLN COMPARISON:  None Available.  CT abdomen pelvis Feb 18 2024 FINDINGS: Exam is very suboptimal due to patient's motion artifact. Lower chest: Calcified nodule anterior aspect of right middle lobe. Scattered additional calcified micro nodules suggestive of prior granulomatous disease. Severe bronchial and bronchiolar wall thickening. Subsegmental atelectasis in right lung base. Atherosclerotic calcifications of coronary arteries. Hepatobiliary: No focal liver abnormality is seen. No gallstones, gallbladder wall thickening, or biliary dilatation. Pancreas: Unremarkable. No pancreatic ductal dilatation or surrounding inflammatory changes. Spleen: Normal in size without focal abnormality.  Adrenals/Urinary Tract: Adrenal glands are unremarkable. Bilateral nonobstructive nephrolithiasis measuring  up to 4 mm. Right kidney inferolateral hypodense lesion with postcontrast enhancement. Evaluation is suboptimal given the patient's motion artifact no hydronephrosis. Bladder is unremarkable. Stomach/Bowel: Prominent gastric folds suggestive of gastritis. Appendix is not visualized. Large stool throughout the colon obscures underlying detail. Colonic diverticulosis. No suspicious bowel wall thickening. No abnormal dilation. Severe atherosclerotic calcifications of aorta and branches. No enlarged abdominal or pelvic lymph nodes. Reproductive: Hysterectomy.  No adnexal mass. Other: No abdominal wall hernia or abnormality. No abdominopelvic ascites. Musculoskeletal: Interval increase compression of deformity of the L3 and L5 vertebral bodies. Additional multilevel compression deformities of the thoracolumbar spine stable to prior. IMPRESSION: Suboptimal exam due to motion artifact. Interval increase in compression deformities of L3 and L5 vertebral bodies. Colonic diverticulosis without definite findings to suggest diverticulitis. Prominent gastric folds suggestive of gastritis. Correlate with clinical findings. Atherosclerotic calcifications of aorta and coronary arteries. Electronically Signed   By: Megan  Zare M.D.   On: 05/30/2024 12:00   CT HEAD WO CONTRAST Result Date: 05/30/2024 EXAM: CT HEAD WITHOUT CONTRAST 05/30/2024 11:19:04 AM TECHNIQUE: CT of the head was performed without the administration of intravenous contrast. Automated exposure control, iterative reconstruction, and/or weight based adjustment of the mA/kV was utilized to reduce the radiation dose to as low as reasonably achievable. COMPARISON: None available. CLINICAL HISTORY: Mental status change, persistent or worsening. FINDINGS: BRAIN AND VENTRICLES: No acute hemorrhage. Gray-white differentiation is preserved. No hydrocephalus. No  extra-axial collection. No mass effect or midline shift. Nonspecific hypoattenuation in the periventricular and subcortical white matter, most likely representing chronic small vessel disease. Mild volume loss. ORBITS: No acute abnormality. SINUSES: No acute abnormality. SOFT TISSUES AND SKULL: No acute soft tissue abnormality. No skull fracture. VASCULATURE: Atherosclerosis involving the carotid siphons and intracranial vertebral arteries bilaterally. IMPRESSION: 1. No acute intracranial abnormality. 2. Nonspecific hypoattenuation in the periventricular and subcortical white matter, most likely representing chronic small vessel disease. 3. Mild volume loss. Electronically signed by: Donnice Mania MD 05/30/2024 11:58 AM EDT RP Workstation: [id number]   DG Chest Portable 1 View Result Date: 05/29/2024 CLINICAL DATA:  Palpitations and chest pain EXAM: PORTABLE CHEST 1 VIEW COMPARISON:  04/16/2024 FINDINGS: Stable cardiomediastinal silhouette. Aortic atherosclerotic calcification. Diffuse interstitial coarsening is similar to prior. Reticulonodular opacities in the bilateral upper lobes. The patient's chin obscures the right apex. No pleural effusion or pneumothorax. No displaced rib fracture. IMPRESSION: Reticulonodular opacities in the bilateral upper lobes may be due small airway infection or inflammation. Electronically Signed   By: Norman Gatlin M.D.   On: 05/29/2024 19:57   IR Radiologist Eval & Mgmt Result Date: 05/05/2024 EXAM: NEW PATIENT OFFICE VISIT CHIEF COMPLAINT: SEE NOTE IN EPIC HISTORY OF PRESENT ILLNESS: SEE NOTE IN EPIC REVIEW OF SYSTEMS: SEE NOTE IN EPIC PHYSICAL EXAMINATION: SEE NOTE IN EPIC ASSESSMENT AND PLAN: SEE NOTE IN EPIC Electronically Signed   By: Wilkie Lent M.D.   On: 05/05/2024 09:27    Microbiology: Recent Results (from the past 240 hours)  Resp panel by RT-PCR (RSV, Flu A&B, Covid) Nasal Swab     Status: None   Collection Time: 05/30/24  1:51 PM   Specimen: Nasal  Swab  Result Value Ref Range Status   SARS Coronavirus 2 by RT PCR NEGATIVE NEGATIVE Final   Influenza A by PCR NEGATIVE NEGATIVE Final   Influenza B by PCR NEGATIVE NEGATIVE Final    Comment: (NOTE) The Xpert Xpress SARS-CoV-2/FLU/RSV plus assay is intended as an aid in the diagnosis of influenza from Nasopharyngeal swab  specimens and should not be used as a sole basis for treatment. Nasal washings and aspirates are unacceptable for Xpert Xpress SARS-CoV-2/FLU/RSV testing.  Fact Sheet for Patients: BloggerCourse.com  Fact Sheet for Healthcare Providers: SeriousBroker.it  This test is not yet approved or cleared by the [place]  FDA and has been authorized for detection and/or diagnosis of SARS-CoV-2 by FDA under an Emergency Use Authorization (EUA). This EUA will remain in effect (meaning this test can be used) for the duration of the COVID-19 declaration under Section 564(b)(1) of the Act, 21 U.S.C. section 360bbb-3(b)(1), unless the authorization is terminated or revoked.     Resp Syncytial Virus by PCR NEGATIVE NEGATIVE Final    Comment: (NOTE) Fact Sheet for Patients: BloggerCourse.com  Fact Sheet for Healthcare Providers: SeriousBroker.it  This test is not yet approved or cleared by the [place]  FDA and has been authorized for detection and/or diagnosis of SARS-CoV-2 by FDA under an Emergency Use Authorization (EUA). This EUA will remain in effect (meaning this test can be used) for the duration of the COVID-19 declaration under Section 564(b)(1) of the Act, 21 U.S.C. section 360bbb-3(b)(1), unless the authorization is terminated or revoked.  Performed at [facility], [address] [place], [place] [address]     Time spent: 25 minutes  Signed: Chapman Rota, MD Jun 30, 2024

## 2024-06-12 NOTE — Plan of Care (Signed)
 Pt expired at 1430.  PIV removed. Family and MD notified.  Family may visit later.  Family wants cremation, undecided where body will go.  Will update when deciding on cremation service.  Will complete post mortem once family has visited.  Problem: Education: Goal: Knowledge of General Education information will improve Description: Including pain rating scale, medication(s)/side effects and non-pharmacologic comfort measures Outcome: Adequate for Discharge   Problem: Clinical Measurements: Goal: Ability to maintain clinical measurements within normal limits will improve Outcome: Adequate for Discharge Goal: Will remain free from infection Outcome: Adequate for Discharge Goal: Diagnostic test results will improve Outcome: Adequate for Discharge Goal: Respiratory complications will improve Outcome: Adequate for Discharge Goal: Cardiovascular complication will be avoided Outcome: Adequate for Discharge   Problem: Activity: Goal: Risk for activity intolerance will decrease Outcome: Adequate for Discharge   Problem: Elimination: Goal: Will not experience complications related to bowel motility Outcome: Adequate for Discharge   Problem: Pain Managment: Goal: General experience of comfort will improve and/or be controlled Outcome: Adequate for Discharge   Problem: Safety: Goal: Ability to remain free from injury will improve Outcome: Adequate for Discharge   Problem: Skin Integrity: Goal: Risk for impaired skin integrity will decrease Outcome: Adequate for Discharge   Problem: Education: Goal: Knowledge of the prescribed therapeutic regimen will improve Outcome: Adequate for Discharge   Problem: Coping: Goal: Ability to identify and develop effective coping behavior will improve Outcome: Adequate for Discharge   Problem: Clinical Measurements: Goal: Quality of life will improve Outcome: Adequate for Discharge   Problem: Respiratory: Goal: Verbalizations of increased ease  of respirations will increase Outcome: Adequate for Discharge   Problem: Role Relationship: Goal: Family's ability to cope with current situation will improve Outcome: Adequate for Discharge Goal: Ability to verbalize concerns, feelings, and thoughts to partner or family member will improve Outcome: Adequate for Discharge   Problem: Pain Management: Goal: Satisfaction with pain management regimen will improve Outcome: Adequate for Discharge

## 2024-06-12 NOTE — TOC Initial Note (Signed)
 Transition of Care [facility]) - Initial/Assessment Note    Patient Details  Name: Julia Hernandez MRN: [id number] Date of Birth: [birth year]  Transition of Care [facility] CM/SW Contact:    Lauraine FORBES Saa, LCSWA Phone Number: 06/04/2024, 2:32 PM  Clinical Narrative:                  2:33 PM CSW spoke with patient's daughter, Stephane (patient was not fully oriented) regarding TOC consult for IP Hospice. Dawn informed CSW that patient is anticipated to have an in-hospital death. CSW relayed information to [facility] and medical team. Bedside RN then notified MD and CSW that patient expired at 14:30.  Expected Discharge Plan: Expired Barriers to Discharge: Other (must enter comment) (Expired)   Patient Goals and CMS Choice            Expected Discharge Plan and Services In-house Referral: Clinical Social Work   Post Acute Care Choice: Hospice Living arrangements for the past 2 months: Apartment                                      Prior Living Arrangements/Services Living arrangements for the past 2 months: Apartment   Patient language and need for interpreter reviewed:: Yes        Need for Family Participation in Patient Care: Yes (Comment)   Current home services: DME Criminal Activity/Legal Involvement Pertinent to Current Situation/Hospitalization: No - Comment as needed  Activities of Daily Living   ADL Screening (condition at time of admission) Independently performs ADLs?: Yes (appropriate for developmental age) Is the patient deaf or have difficulty hearing?: Yes Does the patient have difficulty seeing, even when wearing glasses/contacts?: Yes Does the patient have difficulty concentrating, remembering, or making decisions?: No  Permission Sought/Granted Permission sought to share information with : Family Supports, Oceanographer granted to share information with : No (Contact information on chart)  Share Information with NAME: Stephane Glatter  Permission granted to share info w AGENCY: [facility]  Permission granted to share info w Relationship: Daughter  Permission granted to share info w Contact Information: [phone]  Emotional Assessment   Attitude/Demeanor/Rapport: Unable to Assess Affect (typically observed): Unable to Assess   Alcohol / Substance Use: Not Applicable Psych Involvement: No (comment)  Admission diagnosis:  Somnolence [R40.0] Acute encephalopathy [G93.40] Chest pain, unspecified type [R07.9] Patient Active Problem List   Diagnosis Date Noted   Acute encephalopathy 05/30/2024   Hypercarbia 05/30/2024   Essential hypertension 08/31/2023   Mild protein malnutrition (HCC) 08/30/2023   Acute respiratory failure with hypoxia (HCC) 06/25/2022   Chronic diastolic CHF (congestive heart failure) (HCC) 06/25/2022   Elevated troponin 06/25/2022   Hypokalemia 06/25/2022   Chronic pain 06/25/2022   Compression fracture of spine (HCC) 06/25/2022   Anxiety 06/25/2022   Malnutrition of moderate degree 03/24/2022   DNR (do not resuscitate)/DNI(Do Not Intubate) 03/20/2022   Tobacco abuse 03/19/2022   Stage 3a chronic kidney disease (CKD) (HCC) 08/20/2021   S/P primary angioplasty with coronary stent 08/20/2021   Wedge compression fracture of unspecified lumbar vertebra, initial encounter for closed fracture (HCC) 08/20/2021   Osteoporosis 08/20/2021   Hyperlipidemia 08/20/2021   Major depression 08/20/2021   Vitamin B12 deficiency (non anemic) 08/20/2021   Primary insomnia 08/20/2021   Cardiomyopathy (HCC) 12/14/2018   Atherosclerotic heart disease of native coronary artery without angina pectoris 11/15/2018   Nonrheumatic mitral valve regurgitation 11/11/2018  COPD (chronic obstructive pulmonary disease) (HCC) 11/10/2018   Constipation 11/10/2018   Chronic respiratory failure with hypoxia (HCC) 11/10/2018   PCP:  Cleotilde Planas, MD Pharmacy:   [facility] [phone] - [address], [place] - [address] [place] [address] Phone: [phone] Fax: [phone]  [facility] [address] [place] [address] Phone: [phone] Fax: [phone]     Social Drivers of Health (SDOH) Social History: SDOH Screenings   Food Insecurity: No Food Insecurity (05/31/2024)  Housing: Low Risk  (05/31/2024)  Transportation Needs: No Transportation Needs (05/31/2024)  Utilities: Not At Risk (05/31/2024)  Alcohol Screen: Low Risk  (09/03/2023)  Financial Resource Strain: Low Risk  (09/03/2023)  Social Connections: Socially Isolated (05/31/2024)  Tobacco Use: High Risk (05/29/2024)   SDOH Interventions:     Readmission Risk Interventions    06/26/2022    8:54 AM  Readmission Risk Prevention Plan  Transportation Screening Complete  PCP or Specialist Appt within 3-5 Days Complete  HRI or Home Care Consult Complete  Social Work Consult for Recovery Care Planning/Counseling Complete  Palliative Care Screening Not Applicable  Medication Review Oceanographer) Complete

## 2024-06-12 NOTE — Progress Notes (Signed)
 [id number] Liaison Note:  This patient is currently enrolled in [facility] outpatient palliative care.  Please call with any hospice or outpatient palliative care related questions.  Eleanor Nail, LPN [facility] Liaison [phone]

## 2024-06-12 NOTE — Progress Notes (Signed)
 PROGRESS NOTE  Julia Hernandez  DOB: 04-Apr-1936  PCP: Cleotilde Planas, MD FMW:[id number]  DOA: 05/29/2024  LOS: 2 days  Hospital Day: 5  Brief narrative: Julia Hernandez is a 88 y.o. female with PMH significant for HTN, HLD, CAD/stent, CHF, COPD on 2 L oxygen, chronic smoker, CKD 3A, chronic back pain  8/18, patient presented to the ED with complaint of chest pain, persistent somnolence, progressively worsening bilateral lower extremity edema and worsening somnolence She had been on oxycodone  and Valium  chronically. In the ED, she was found to be hypersomnolent which did not improve in several hours in the ED and hence worked up for other possible etiologies.  Initial blood gas showed mild CO2 retention at 64 but VBG repeated in next several hours showed pH low at 7.11 with pCO2 more than 123,  WBC count worsened as well. CT head unremarkable Started on BiPAP Patient was admitted to [facility] Given significantly elevated CO2 level, critical care was consulted  Subjective: Patient was seen and examined this morning. Comfortable at the time of my evaluation. Event from last night noted.  Patient was restless and was given IV morphine  which helped.   Assessment and plan: Comfort care status Initially admitted for acute exacerbation of respiratory failure, altered mental status. After discussion with family, DNR was confirmed and patient was switched to comfort care measures. Comfort care orders in place with IV morphine , IV Ativan  as needed Hospice consultation requested for possible inpatient hospice care. Blood pressure has been running low, there is a chance of in-hospital mortality as well.  Will be probably more clear in next 24 hours.  Other issues Acute on chronic respiratory failure with hypoxia and hypercapnia Acute metabolic encephalopathy Hypertension Hyperlipidemia CKD 3A CAD s/p stent Anxiety/Depression Chronic pain   Goals of care   Code Status: Do not attempt resuscitation  (DNR) - Comfort care   Family Communication: Daughter not at bedside today  Status: Inpatient Level of care:  Med-Surg   Patient is from: Home Needs to continue in-hospital care: Comfort care     Diet:  Diet Order             Diet NPO time specified  Diet effective now                   Scheduled Meds:  B-complex with vitamin C  1 tablet Oral Daily   feeding supplement  237 mL Oral BID BM   ferrous sulfate   325 mg Oral Q breakfast   sodium chloride  flush  3 mL Intravenous Q12H    PRN meds: acetaminophen  **OR** acetaminophen , diazepam , ipratropium-albuterol , morphine  injection, morphine  CONCENTRATE **OR** morphine  CONCENTRATE, ondansetron  **OR** ondansetron  (ZOFRAN ) IV, polyethylene glycol   Infusions:     Antimicrobials: Anti-infectives (From admission, onward)    Start     Dose/Rate Route Frequency Ordered Stop   05/30/24 1400  cefTRIAXone  (ROCEPHIN ) 1 g in sodium chloride  0.9 % 100 mL IVPB  Status:  Discontinued        1 g 200 mL/hr over 30 Minutes Intravenous Every 24 hours 05/30/24 1350 05/31/24 1839   05/30/24 1400  azithromycin  (ZITHROMAX ) 500 mg in sodium chloride  0.9 % 250 mL IVPB  Status:  Discontinued        500 mg 250 mL/hr over 60 Minutes Intravenous Every 24 hours 05/30/24 1350 05/31/24 1839       Objective: Vitals:   05/19/2024 0600 06/09/2024 0759  BP: (!) 63/32 (!) 64/31  Pulse: 80 75  Resp: ROLLEN)  24   Temp: (!) 97.5 F (36.4 C) (!) 97.5 F (36.4 C)  SpO2:     No intake or output data in the 24 hours ending 05/12/2024 1415  Filed Weights   05/29/24 1856 05/30/24 2347 05/31/24 0600  Weight: 39 kg 42.2 kg 43.6 kg   Weight change:  Body mass index is 23.18 kg/m.   Physical Exam: General exam: Pleasant, elderly Caucasian female.  Comfortable   Chapman Rota, MD [facility] 05/23/2024

## 2024-06-12 NOTE — Plan of Care (Signed)
 Patient is comfort care and family is coping as well as they can. Questions answered for daughter and her contact number given for any changes in patient condition

## 2024-06-12 DEATH — deceased
